# Patient Record
Sex: Male | Born: 1950 | Race: White | Hispanic: No | Marital: Married | Smoking: Never smoker
Health system: Southern US, Community
[De-identification: ages and names within clinical notes are randomized; demographics above are authoritative.]

## PROBLEM LIST (undated history)

## (undated) DIAGNOSIS — K219 Gastro-esophageal reflux disease without esophagitis: Secondary | ICD-10-CM

## (undated) DIAGNOSIS — F32A Depression, unspecified: Secondary | ICD-10-CM

## (undated) DIAGNOSIS — J40 Bronchitis, not specified as acute or chronic: Secondary | ICD-10-CM

## (undated) DIAGNOSIS — M1990 Unspecified osteoarthritis, unspecified site: Secondary | ICD-10-CM

## (undated) DIAGNOSIS — C4490 Unspecified malignant neoplasm of skin, unspecified: Secondary | ICD-10-CM

## (undated) DIAGNOSIS — I10 Essential (primary) hypertension: Secondary | ICD-10-CM

## (undated) HISTORY — PX: SKIN CANCER EXCISION: SHX779

## (undated) HISTORY — PX: JOINT REPLACEMENT: SHX530

## (undated) HISTORY — PX: HERNIA REPAIR: SHX51

## (undated) HISTORY — PX: ROTATOR CUFF REPAIR: SHX139

---

## 2001-11-29 ENCOUNTER — Ambulatory Visit (HOSPITAL_BASED_OUTPATIENT_CLINIC_OR_DEPARTMENT_OTHER): Admission: RE | Admit: 2001-11-29 | Discharge: 2001-11-29 | Payer: Self-pay | Admitting: Urology

## 2003-03-26 ENCOUNTER — Encounter (INDEPENDENT_AMBULATORY_CARE_PROVIDER_SITE_OTHER): Payer: Self-pay | Admitting: *Deleted

## 2003-03-26 ENCOUNTER — Ambulatory Visit (HOSPITAL_COMMUNITY): Admission: RE | Admit: 2003-03-26 | Discharge: 2003-03-26 | Payer: Self-pay | Admitting: Gastroenterology

## 2007-03-22 ENCOUNTER — Ambulatory Visit: Payer: Self-pay | Admitting: Internal Medicine

## 2007-07-02 ENCOUNTER — Ambulatory Visit: Payer: Self-pay | Admitting: Internal Medicine

## 2008-10-20 ENCOUNTER — Ambulatory Visit: Payer: Self-pay | Admitting: Internal Medicine

## 2008-10-20 DIAGNOSIS — I10 Essential (primary) hypertension: Secondary | ICD-10-CM | POA: Insufficient documentation

## 2008-10-20 DIAGNOSIS — K219 Gastro-esophageal reflux disease without esophagitis: Secondary | ICD-10-CM | POA: Insufficient documentation

## 2011-02-15 NOTE — Assessment & Plan Note (Signed)
[facility]                             PULMONARY OFFICE NOTE   NAME:[name], [name]                       MRN:          [id number]  DATE:07/02/2007                            DOB:          08/02/1951    HISTORY:  A 60 year old who returns all smiles having totally  eliminated a chronic cough with the use of Nexium daily.  He  acknowledged that he had had mild reflux in the past but did not connect  the reflux and the cough since sometimes he would cough without overt  heartburn.   He is also concerned about hypertension today, having been switched over  to Benicar HCT with orthostatic hypotension even when he takes half a  pill and therefore stopped the medicine three days before coming to the  office for follow-up today.   PHYSICAL EXAMINATION:  He is a pleasant ambulatory white male in no  acute distress.  He had normal vital signs, except for a blood pressure of 150/108.  HEENT:  Unremarkable.  Pharynx is clear.  LUNGS:  Lung fields are clear bilaterally to auscultation and  percussion.  No bruits.  HEART:  Regular rhythm without murmur, rub, or gallop.  ABDOMEN:  Soft, benign.  EXTREMITIES:  Warm without calf tenderness, cyanosis, clubbing.   IMPRESSION:  1. 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  The patient is now      convinced that acid reflux does play a role, but is still not      willing to continue the medicine long term nor should he probably      need to.  However, if we find that every time that he needs      Prilosec on any more than a p.r.n. basis to control cough or overt      heartburn, I think it would make sense to have this patient at age      42 referred to a gastroenterologist to look for other secondary      problems of chronic occult reflux such as esophageal stricture or      Barrett's esophagitis.  2. For now it is fine with me if he uses p.r.n. Prilosec in the place      of the more expensive Nexium  prescription as long as he remembers      the treatment of cyclical cough that I outlined for him in detail.  3. His hypertension is not controlled off of all therapy.  He is      concerned about the cost of his medicines, so I gave him Micardis      40 to take one half daily.  If this is not satisfactory, one option      might be to use low dose clonidine, which will help with both      stress-induced hypertension and also pain-induced (he attributes      much of his poorly controlled hypertension to chronic shoulder      pain).  However, this can certainly be done through Dr. Aurelio Brash      office.  His pulmonary follow-up could be  p.r.n.     Charlaine Dalton. Sherene Sires, MD, [facility]  Electronically Signed    MBW/MedQ  DD: 07/02/2007  DT: 07/03/2007  Job #: [id number]   cc:   Vikki Ports, M.D.

## 2011-02-18 NOTE — Op Note (Signed)
   NAME:  [name] [name], [name] [name]                          ACCOUNT NO.:  [id number]   MEDICAL RECORD NO.:  [id number]                   PATIENT TYPE:  AMB   LOCATION:  ENDO                                 FACILITY:  [facility]   PHYSICIAN:  Graylin Shiver, M.D.                DATE OF BIRTH:  December 16, 1950   DATE OF PROCEDURE:  03/26/2003  DATE OF DISCHARGE:                                 OPERATIVE REPORT   PROCEDURE:  Colonoscopy with polypectomy.   INDICATION FOR PROCEDURE:  Rectal bleeding.   Informed consent was obtained after explanation of the risks of bleeding,  infection, and perforation.   PREMEDICATION:  Fentanyl 70 mcg IV, Versed 7 mg IV.   PROCEDURE:  With the patient in the left lateral decubitus position, a  rectal exam was performed and no masses were felt.  The Olympus colonoscope  was inserted into the rectum and advanced around the colon to the cecum.  Cecal landmarks were identified.  The cecum looked normal.  In the mid-  ascending colon there was an 8 mm sessile polyp.  This was snared and  removed by snare cautery technique.  The polyp skirted off after it was  snared and I never saw the full entire polyp again despite searching the  area.  Suctioning of the fluid in the area did reveal what appeared to be a  piece of polypoid tissue in the container jar; however, it did not appear to  be the whole polyp but hopefully enough for histologic inspection.  The  transverse colon looked normal.  The descending colon, sigmoid, and rectum  looked normal.  The scope was brought out.  The anal canal appeared slightly  reddened.  He tolerated the procedure well without complications.   IMPRESSION:  1. Ascending colon polyp.  2. Redness and erythema in the anal canal.   PLAN:  The pathology on the specimen obtained will be checked.  Hopefully,  there is polypoid tissue there to analyze the histologic appearance of this  polyp.  I suspect that the intermittent rectal bleeding that he  has comes  from anal irritation.                                               Graylin Shiver, M.D.    SFG/MEDQ  D:  03/26/2003  T:  03/27/2003  Job:  [id number]   cc:   Caryn Bee L. Little, M.D.  [address]  [place]  [place] [address]  Fax: [phone]

## 2011-02-18 NOTE — Op Note (Signed)
[facility]  Patient:    [name] [name], [name] [name] Visit Number: [id number] MRN: [id number]          Service Type: NES Location: [facility] Attending Physician:  Monica Becton Proc. Date: 11/29/01 Admit Date:  11/29/2001                             Operative Report  PREOPERATIVE DIAGNOSIS:  Penile glandular adhesion.  POSTOPERATIVE DIAGNOSIS:  Penile glandular adhesion.  PROCEDURE PERFORMED:  Lysis of glandular penile adhesion.  SURGEON:  Claudette Laws, M.D.  ANESTHESIA:  LMA.  DESCRIPTION OF PROCEDURE:  The patient was prepped under LMA anesthesia in the supine position.  He was found to have a rather broad based penile adhesion extending from the corona to the shaft of the penis for about 1.5 cm.  Using a needle tip cautery, we lysed the adhesion right adjacent to the corona of the glans penis and also on the shaft of the penis.  This left two elliptical defects which we then closed with interrupted 4-0 chromic catgut sutures. Approximately 1-2 cc of 1% Xylocaine jelly was used for a penile block.  We then applied Xeroform gauze dressing impregnated with bacitracin ointment followed by Coban circular dressing.  The patient was then taken back to the recovery room in satisfactory condition. Attending Physician:  Monica Becton DD:  11/29/01 TD:  11/29/01 Job: [id number]

## 2011-02-18 NOTE — Assessment & Plan Note (Signed)
[facility]                             PULMONARY OFFICE NOTE   NAME:[name] [name]                       MRN:          [id number]  DATE:03/29/2007                            DOB:          1950-12-22    REFERRING PHYSICIAN:  Vikki Ports, M.D.   REASON FOR CONSULTATION:  Cough.   HISTORY:  This is a 60 year old white male, who says he has been  coughing since he was about 60 years old.  The cough comes and goes,  seems some better while on reflux medicines but typically flares when  he stops the reflux medicines.  He denies any typical seasonal variation  of the cough, associated wheezing or dyspnea.  The cough is more daytime  than nighttime, and denies any nocturnal exacerbation of either cough,  wheezing, orthopnea, PND, leg swelling, fevers, chills, sweats, and  unintended weight loss.  The patient denies any, as I think I said  already, dyspnea or chest pain.   The problem is the patient does not necessarily correlate when his acid  reflux is bad unless his cough was bad.  He does think that dry weather  makes it a little bit worse but otherwise has no obvious other  triggering factors or alleviating factors other Other than the fact  refluxes medicines make it some better.   PAST MEDICAL HISTORY:  Recorded in detail from the worksheet and  unremarkable.   ALLERGIES:  PENICILLIN CAUSES HIVES.   MEDICATIONS:  Flomax and Zoloft only.   SOCIAL HISTORY:  He has never smoked.  He works as a Special educational needs teacher.   FAMILY HISTORY:  Significant for the absence of respiratory disease,  atopy.   REVIEW OF SYSTEMS:  Taken in detail on the work sheet.  Negative except  as outlined above.  Occasional nasal congestion which he does not  correlate with a cough.   PHYSICAL EXAMINATION:  GENERAL:  This is a pleasant ambulatory  moderately obese white male in no acute distress.  VITAL SIGNS:  Normal vital signs.  HEENT:  Mild turbinate edema,  nonspecific in nature.  Oropharynx is  clear.  No evidence of postnasal drainage or cobblestoning.  Air canals  clear.  NECK:  Supple without cervical adenopathy or tenderness.  Trachea is  midline, no thyromegaly.  LUNGS:  Lung fields are perfectly clear bilaterally to auscultation and  percussion.  HEART:  There is a regular rhythm without murmur, gallop.  ABDOMEN:  Soft, benign with no palpable organomegaly, mass or  tenderness.  EXTREMITIES:  Warm without calf tenderness, cyanosis, clubbing, or  edema.  NEUROLOGIC:  No focal deficits or pathologic reflexes.  SKIN:  Warm and dry.   LABORATORY DATA:  Hemoglobin saturation 96% on room air.   Chest x-ray was reviewed Feb 21, 2006, and was normal.  PFTs were  reviewed from Feb 18, 2007, and were also normal.   IMPRESSION:  This patient has an almost lifelong cough that comes and  goes and seems better with proton pump inhibitor therapy.  The problem  is that he thinks as long as he  is not having active reflux, that it  could not be contributing to a cough, and this understands the fact that  coughing causes reflux just as much as reflux causes coughing.  In  addition, the amount of acid that it takes to irritate the upper airway  is much less than the amount of acid to cause overt heartburn symptoms.   In addition, he tends to use way too many menthol products just to  suppress the urge to cough which tends to relax the gastroesophageal  valve and cause nonacid reflux, which he may not even recognize as  reflux.   I therefore recommended the following:  1. First, I educated him on diet which is just as important according      to the cough specialists as is proton pump inhibitor in this      condition.  2. I recommended he continued Nexium perfectly regular before      breakfast for the next 6 weeks before returning here for followup.  3. Suppress excess cough with Delsym plus tramadol p.r.n.  4. Six day course of prednisone  was also recommended for the      inflammatory component typically associated with cyclical coughing.  5. If this does not eliminate the cough, the next step would be to get      a methacholine challenge test while still maintaining proton pump      inhibitor therapy.     Charlaine Dalton. Sherene Sires, MD, [facility]  Electronically Signed    MBW/MedQ  DD: 03/29/2007  DT: 03/29/2007  Job #: [id number]   cc:   Vikki Ports, M.D.

## 2011-04-04 ENCOUNTER — Other Ambulatory Visit: Payer: Self-pay | Admitting: Dermatology

## 2012-04-30 ENCOUNTER — Other Ambulatory Visit: Payer: Self-pay | Admitting: Dermatology

## 2012-09-14 ENCOUNTER — Other Ambulatory Visit: Payer: Self-pay | Admitting: Dermatology

## 2012-10-22 ENCOUNTER — Other Ambulatory Visit: Payer: Self-pay | Admitting: Dermatology

## 2013-04-15 ENCOUNTER — Other Ambulatory Visit: Payer: Self-pay | Admitting: Gastroenterology

## 2014-12-20 ENCOUNTER — Emergency Department (HOSPITAL_COMMUNITY): Payer: BLUE CROSS/BLUE SHIELD

## 2014-12-20 ENCOUNTER — Emergency Department (HOSPITAL_COMMUNITY)
Admission: EM | Admit: 2014-12-20 | Discharge: 2014-12-20 | Disposition: A | Discharge status: Home / Self Care | Payer: BLUE CROSS/BLUE SHIELD | Attending: Emergency Medicine | Admitting: Emergency Medicine

## 2014-12-20 ENCOUNTER — Encounter (HOSPITAL_COMMUNITY): Payer: Self-pay | Admitting: Nurse Practitioner

## 2014-12-20 DIAGNOSIS — S300XXA Contusion of lower back and pelvis, initial encounter: Secondary | ICD-10-CM

## 2014-12-20 DIAGNOSIS — Y9389 Activity, other specified: Secondary | ICD-10-CM | POA: Insufficient documentation

## 2014-12-20 DIAGNOSIS — Y998 Other external cause status: Secondary | ICD-10-CM | POA: Diagnosis not present

## 2014-12-20 DIAGNOSIS — J069 Acute upper respiratory infection, unspecified: Secondary | ICD-10-CM | POA: Diagnosis not present

## 2014-12-20 DIAGNOSIS — S3992XA Unspecified injury of lower back, initial encounter: Secondary | ICD-10-CM | POA: Diagnosis present

## 2014-12-20 DIAGNOSIS — Z880 Allergy status to penicillin: Secondary | ICD-10-CM | POA: Diagnosis not present

## 2014-12-20 DIAGNOSIS — Y9289 Other specified places as the place of occurrence of the external cause: Secondary | ICD-10-CM | POA: Diagnosis not present

## 2014-12-20 DIAGNOSIS — W01198A Fall on same level from slipping, tripping and stumbling with subsequent striking against other object, initial encounter: Secondary | ICD-10-CM | POA: Insufficient documentation

## 2014-12-20 DIAGNOSIS — I10 Essential (primary) hypertension: Secondary | ICD-10-CM | POA: Insufficient documentation

## 2014-12-20 DIAGNOSIS — Z79899 Other long term (current) drug therapy: Secondary | ICD-10-CM | POA: Insufficient documentation

## 2014-12-20 DIAGNOSIS — S20229A Contusion of unspecified back wall of thorax, initial encounter: Secondary | ICD-10-CM | POA: Diagnosis not present

## 2014-12-20 HISTORY — DX: Essential (primary) hypertension: I10

## 2014-12-20 HISTORY — DX: Bronchitis, not specified as acute or chronic: J40

## 2014-12-20 LAB — BASIC METABOLIC PANEL
Anion gap: 10 (ref 5–15)
BUN: 14 mg/dL (ref 6–23)
CHLORIDE: 101 mmol/L (ref 96–112)
CO2: 27 mmol/L (ref 19–32)
Calcium: 9.5 mg/dL (ref 8.4–10.5)
Creatinine, Ser: 0.89 mg/dL (ref 0.50–1.35)
GFR calc Af Amer: >90 mL/min (ref >90)
GFR calc non Af Amer: 90 mL/min — ABNORMAL LOW (ref >90)
Glucose, Bld: 107 mg/dL — ABNORMAL HIGH (ref 70–99)
POTASSIUM: 4.3 mmol/L (ref 3.5–5.1)
Sodium: 138 mmol/L (ref 135–145)

## 2014-12-20 LAB — PROTIME-INR
INR: 0.97 (ref 0.00–1.49)
Prothrombin Time: 13 seconds (ref 11.6–15.2)

## 2014-12-20 LAB — CBC
HEMATOCRIT: 43.1 % (ref 39.0–52.0)
Hemoglobin: 15.1 g/dL (ref 13.0–17.0)
MCH: 33 pg (ref 26.0–34.0)
MCHC: 35 g/dL (ref 30.0–36.0)
MCV: 94.1 fL (ref 78.0–100.0)
PLATELETS: 142 10*3/uL — AB (ref 150–400)
RBC: 4.58 MIL/uL (ref 4.22–5.81)
RDW: 13.2 % (ref 11.5–15.5)
WBC: 6.8 10*3/uL (ref 4.0–10.5)

## 2014-12-20 LAB — APTT: APTT: 25 s (ref 24–37)

## 2014-12-20 MED ORDER — NAPROXEN 500 MG PO TABS: 500.0000 mg | ORAL_TABLET | Freq: Two times a day (BID) | ORAL | Status: DC

## 2014-12-20 MED ORDER — NAPROXEN 250 MG PO TABS
500.0000 mg | ORAL_TABLET | Freq: Once | ORAL | Status: AC
  Administered 2014-12-20: 500 mg via ORAL
  Filled 2014-12-20: qty 2

## 2014-12-20 NOTE — ED Notes (Signed)
Pt left without being given discharge instructions.  Pt was seen by MD before leaving.  Unable to obtain signature or provide packet.  RN to call to provide pt with opportunity to pick up paperwork from [facility].

## 2014-12-20 NOTE — Discharge Instructions (Signed)
Contusion °A contusion is a deep bruise. Contusions are the result of an injury that caused bleeding under the skin. The contusion may turn blue, purple, or yellow. Minor injuries will give you a painless contusion, but more severe contusions may stay painful and swollen for a few weeks.  °CAUSES  °A contusion is usually caused by a blow, trauma, or direct force to an area of the body. °SYMPTOMS  °· Swelling and redness of the injured area. °· Bruising of the injured area. °· Tenderness and soreness of the injured area. °· Pain. °DIAGNOSIS  °The diagnosis can be made by taking a history and physical exam. An X-ray, CT scan, or MRI may be needed to determine if there were any associated injuries, such as fractures. °TREATMENT  °Specific treatment will depend on what area of the body was injured. In general, the best treatment for a contusion is resting, icing, elevating, and applying cold compresses to the injured area. Over-the-counter medicines may also be recommended for pain control. Ask your caregiver what the best treatment is for your contusion. °HOME CARE INSTRUCTIONS  °· Put ice on the injured area. °¨ Put ice in a plastic bag. °¨ Place a towel between your skin and the bag. °¨ Leave the ice on for 15-20 minutes, 3-4 times a day, or as directed by your health care provider. °· Only take over-the-counter or prescription medicines for pain, discomfort, or fever as directed by your caregiver. Your caregiver may recommend avoiding anti-inflammatory medicines (aspirin, ibuprofen, and naproxen) for 48 hours because these medicines may increase bruising. °· Rest the injured area. °· If possible, elevate the injured area to reduce swelling. °SEEK IMMEDIATE MEDICAL CARE IF:  °· You have increased bruising or swelling. °· You have pain that is getting worse. °· Your swelling or pain is not relieved with medicines. °MAKE SURE YOU:  °· Understand these instructions. °· Will watch your condition. °· Will get help right  away if you are not doing well or get worse. °Document Released: 06/29/2005 Document Revised: 09/24/2013 Document Reviewed: 07/25/2011 °Exit[employer]® Patient Information ©2015 [employer]. This information is not intended to replace advice given to you by your health care provider. Make sure you discuss any questions you have with your health care provider. ° °

## 2014-12-20 NOTE — ED Provider Notes (Signed)
CSN: [id number]     Arrival date & time 12/20/14  1529 History   First MD Initiated Contact with Patient 12/20/14 1634     Chief Complaint  Patient presents with  . Fall  . Cough     (Consider location/radiation/quality/duration/timing/severity/associated sxs/prior Treatment) Patient is a 65 y.o. male presenting with fall and cough. The history is provided by the patient.  Fall This is a new problem. The current episode started in the past 7 days. The problem occurs constantly. The problem has been gradually worsening. Associated symptoms include coughing. Pertinent negatives include no arthralgias, chest pain, fatigue, fever, headaches, joint swelling, myalgias, neck pain, numbness, rash or weakness. Nothing aggravates the symptoms. He has tried nothing for the symptoms. The treatment provided no relief.  Cough Cough characteristics:  Productive Sputum characteristics:  White Severity:  Mild Onset quality:  Gradual Duration:  7 days Timing:  Constant Progression:  Worsening Chronicity:  New Smoker: no   Relieved by:  None tried Worsened by:  Nothing tried Ineffective treatments:  None tried Associated symptoms: sinus congestion   Associated symptoms: no chest pain, no fever, no headaches, no myalgias, no rash and no shortness of breath   Risk factors: recent travel ([place]     Past Medical History  Diagnosis Date  . Hypertension   . Bronchitis    History reviewed. No pertinent past surgical history. History reviewed. No pertinent family history. History  Substance Use Topics  . Smoking status: Never Smoker   . Smokeless tobacco: Not on file  . Alcohol Use: Yes    Review of Systems  Constitutional: Negative for fever and fatigue.  Respiratory: Positive for cough. Negative for shortness of breath.   Cardiovascular: Negative for chest pain.  Musculoskeletal: Positive for back pain. Negative for myalgias, joint swelling, arthralgias, gait problem and neck  pain.  Skin: Negative for rash.  Neurological: Negative for weakness, numbness and headaches.  All other systems reviewed and are negative.     Allergies  Penicillins  Home Medications   Prior to Admission medications   Medication Sig Start Date End Date Taking? Authorizing Provider  Multiple Vitamin (MULTI-VITAMIN PO) Take 1 tablet by mouth daily.   Yes Historical Provider, MD  Omega-3 Fatty Acids (FISH OIL PO) Take 1 capsule by mouth daily.   Yes Historical Provider, MD  sertraline (ZOLOFT) 100 MG tablet Take 50 mg by mouth daily. 10/27/14  Yes Historical Provider, MD  tamsulosin (FLOMAX) 0.4 MG CAPS capsule Take 0.4 mg by mouth daily. 12/04/14  Yes Historical Provider, MD  telmisartan (MICARDIS) 40 MG tablet Take 40 mg by mouth daily. 10/27/14  Yes Historical Provider, MD  naproxen (NAPROSYN) 500 MG tablet Take 1 tablet (500 mg total) by mouth 2 (two) times daily with a meal. 12/20/14   Larence Penning, MD   BP 173/95 mmHg  Pulse 92  Temp(Src) 97.9 F (36.6 C) (Oral)  Resp 18  SpO2 97% Physical Exam  Constitutional: He is oriented to person, place, and time. He appears well-developed and well-nourished. No distress.  Alert, pleasant, cooperative  HENT:  Head: Normocephalic and atraumatic.  Mouth/Throat: Oropharynx is clear and moist. No oropharyngeal exudate.  Nasal congestion. Mild posterior oropharyngeal erythema.  Eyes: Conjunctivae and EOM are normal. Pupils are equal, round, and reactive to light.  Neck: Normal range of motion. Neck supple.  Cardiovascular: Normal rate, regular rhythm, normal heart sounds and intact distal pulses.  Exam reveals no gallop and no friction rub.   No murmur  heard. Pulmonary/Chest: Effort normal and breath sounds normal. No respiratory distress. He has no wheezes. He has no rales.  Abdominal: Soft. He exhibits no distension. There is no tenderness.  Musculoskeletal: Normal range of motion. He exhibits tenderness. He exhibits no edema.  Bruising,  hematoma to lumbar and sacral area. Bruising extends down the right buttocks. No overlying step-offs or deformities noted.  Lymphadenopathy:    He has no cervical adenopathy.  Neurological: He is alert and oriented to person, place, and time. He displays normal reflexes. No cranial nerve deficit.  5/5 strength in bilateral lower extremities. No sensory deficits. Ambulatory without ataxia or pain.  Skin: Skin is warm and dry. No rash noted. He is not diaphoretic.  Psychiatric: He has a normal mood and affect. His behavior is normal. Judgment and thought content normal.  Nursing note and vitals reviewed.   ED Course  Procedures (including critical care time) Labs Review Labs Reviewed  CBC - Abnormal; Notable for the following:    Platelets 142 (*)    All other components within normal limits  BASIC METABOLIC PANEL - Abnormal; Notable for the following:    Glucose, Bld 107 (*)    GFR calc non Af Amer 90 (*)    All other components within normal limits  PROTIME-INR  APTT    Imaging Review Dg Chest 2 View (if Patient Has Fever And/or Copd)  12/20/2014   CLINICAL DATA:  Recent history of bronchitis and cough  EXAM: CHEST  2 VIEW  COMPARISON:  11/25/2014  FINDINGS: Cardiac shadow is within normal limits. The lungs are well aerated bilaterally. No focal infiltrate or sizable effusion is seen. Degenerative changes of the thoracic spine are noted.  IMPRESSION: No acute abnormality noted.   Electronically Signed   By: Inez Catalina M.D.   On: 12/20/2014 16:21   Dg Lumbar Spine 2-3 Views  12/20/2014   CLINICAL DATA:  Fall with lumbar injury 5 days ago. Continued pain and swelling. Initial encounter.  EXAM: LUMBAR SPINE - 2-3 VIEW  COMPARISON:  None.  FINDINGS: Five non rib-bearing lumbar type vertebra are identified in normal alignment.  There is no evidence of acute fracture or subluxation.  Very mild multilevel degenerative disc disease noted.  No focal bony lesions are identified.  IMPRESSION: No  acute bony abnormality.  Very mild multilevel degenerative disc disease.   Electronically Signed   By: Margarette Canada M.D.   On: 12/20/2014 18:20   Dg Sacrum/coccyx  12/20/2014   CLINICAL DATA:  He slipped on wet floor landing on his lower back on on air condition unit 5 days ago. He has large purple bruised area covering his entire lower back with some swelling. He c/o severe pain at the site  EXAM: SACRUM AND COCCYX - 2+ VIEW  COMPARISON:  None.  FINDINGS: There is no evidence of fracture or other focal bone lesions.  IMPRESSION: Negative.   Electronically Signed   By: Lajean Manes M.D.   On: 12/20/2014 18:19     EKG Interpretation None      MDM   Final diagnoses:  Lumbar contusion, initial encounter  Viral URI   64 year old male with no significant past medical history presents with 2 complaints. First is nonproductive cough with upper respiratory congestion and infection. Symptoms present the last 2-3 days and also she was fever or dyspnea or chest pain. Chest x-ray obtained from triage is negative and history and physical exam consistent with upper respiratory infection of viral origin. Afebrile  and he mechanically stable. No indications that this is related to a travel borne illness as he has no other associated symptoms.  Second complaint is lumbar and sacral back pain with bruising and hematoma. Symptoms are present for one week and bruising is worsening since his fall. He had a fall from ground level and hit his back on a marble ledge. No weakness numbness or tingling in lower extremities, no loss of bowel or bladder, and no signs or symptoms concerning for cauda equina. He is ambulatory without difficulty and has a nonfocal neurologic exam. Likely this is a localized hematoma and bruising, however we will evaluate for lumbar or sacral fracture with plain films. He has not tried anything for pain and we will start with naproxen at the patient's request. Blood work including coags sent for  evaluation.  Blood work reassuring. Plain films negative. DC to home with anti-inflammatories and RICE therapy. PCP follow up as needed.  Larence Penning, MD 12/20/14 [id number]  Pattricia Boss, MD 12/20/14 6433

## 2014-12-20 NOTE — ED Notes (Addendum)
He slipped on wet floor landing on his lower back on on air condition unit 5 days ago. He has large purple bruised area covering his entire lower back with some swelling. He c/o severe pain at the site. He denies taking any blood thinners.  He recently was treated for bronchitis with inhalers by his PCP, states things were somewhat better but over past week cough has gotten worse and now hes coughing up white sputum. Denies fevers. Denies CP. A&Ox4, breathing easily, ambulatory

## 2015-05-08 ENCOUNTER — Encounter: Payer: Self-pay | Admitting: Cardiovascular Disease

## 2015-05-08 ENCOUNTER — Ambulatory Visit (INDEPENDENT_AMBULATORY_CARE_PROVIDER_SITE_OTHER): Payer: BLUE CROSS/BLUE SHIELD | Admitting: Cardiovascular Disease

## 2015-05-08 VITALS — BP 148/92 | HR 70 | Ht 69.0 in | Wt 226.3 lb

## 2015-05-08 DIAGNOSIS — Z8249 Family history of ischemic heart disease and other diseases of the circulatory system: Secondary | ICD-10-CM

## 2015-05-08 DIAGNOSIS — Z79899 Other long term (current) drug therapy: Secondary | ICD-10-CM

## 2015-05-08 DIAGNOSIS — E785 Hyperlipidemia, unspecified: Secondary | ICD-10-CM

## 2015-05-08 MED ORDER — ATORVASTATIN CALCIUM 40 MG PO TABS: 40.0000 mg | ORAL_TABLET | Freq: Every day | ORAL | Status: DC

## 2015-05-08 NOTE — Progress Notes (Signed)
Cardiology Office Note   Date:  05/08/2015   ID:  [name] [name], DOB 11/17/1950, MRN [id number]  PCP:  Cari Caraway, MD  Cardiologist:   Sharol Harness, MD   Chief Complaint  Patient presents with  . Establish Care    family history of cad,,no chest pain , no sob , no swelling- sister just recently had CABG X3      History of Present Illness: [name] [name] is a 64 y.o. male with HTN who presents for cardiac stress testing due to family history of CAD.  [name] [name] is a physically active gentleman who presents today because his older sister recently had a large heart attack and required coronary artery bypass grafting. He exercises twice per week by lifting weights and doing cardio and golfs twice per week. He never has chest pain or pressure with these activities. He also denies shortness of breath, lower extremity edema, orthopnea, PND and palpitations. He is a part of the wellness program at his job and regularly checks both his blood pressure and lipids. Most recently his total cholesterol was 181 with an LDL of 93 and HDL of 81. He does not smoke, though he is frequently around a lot of secondhand smoke.  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 His job is very stressful and he has also had some recent life stressors. The stress does not prop chest pain or shortness of breath.  He reports drinking up to five bourbon drinks or a bottle of wine on weekend evenings. He does not drink during the week.  [name] [name] has a sharp family history of coronary artery disease. His father had a heart attack at age 59 and died at age 60. His paternal grandfather had a heart attack at age 109. His mother had a heart attack at age 34, though she was a heavy smoker.    Past Medical History  Diagnosis Date  . Hypertension   . Bronchitis     No past surgical history on file.   Current Outpatient Prescriptions  Medication Sig Dispense Refill  . atorvastatin (LIPITOR) 40 MG  tablet Take 1 tablet (40 mg total) by mouth at bedtime. 90 tablet 3  . Multiple Vitamin (MULTI-VITAMIN PO) Take 1 tablet by mouth daily.    . naproxen (NAPROSYN) 500 MG tablet Take 1 tablet (500 mg total) by mouth 2 (two) times daily with a meal. 14 tablet 0  . Omega-3 Fatty Acids (FISH OIL PO) Take 1 capsule by mouth daily.    . sertraline (ZOLOFT) 100 MG tablet Take 50 mg by mouth daily.  0  . tamsulosin (FLOMAX) 0.4 MG CAPS capsule Take 0.4 mg by mouth daily.  0  . telmisartan (MICARDIS) 40 MG tablet Take 40 mg by mouth daily.  0   No current facility-administered medications for this visit.    Allergies:   Penicillins    Social History:  The patient  reports that he has never smoked. He does not have any smokeless tobacco history on file. He reports that he drinks alcohol. He reports that he does not use illicit drugs.   Family History:  The patient's family history includes Emphysema in his mother; Heart attack in his father; Heart disease in his father; Heart disease (age of onset: 79) in his sister.    ROS:  Please see the history of present illness.   Otherwise, review of systems are positive for sinus congestion.   All other systems are reviewed and negative.  PHYSICAL EXAM: VS:  BP 148/92 mmHg  Pulse 70  Ht 5\' 9"  (1.753 m)  Wt 102.649 kg (226 lb 4.8 oz)  BMI 33.40 kg/m2 , BMI Body mass index is 33.4 kg/(m^2). GENERAL:  Well appearing HEENT:  Pupils equal round and reactive, fundi not visualized, oral mucosa unremarkable NECK:  No jugular venous distention, waveform within normal limits, carotid upstroke brisk and symmetric, no bruits, no thyromegaly LYMPHATICS:  No cervical adenopathy LUNGS:  Clear to auscultation bilaterally HEART:  RRR.  PMI not displaced or sustained,S1 and S2 within normal limits, no S3, no S4, no clicks, no rubs, no murmurs ABD:  Flat, positive bowel sounds normal in frequency in pitch, no bruits, no rebound, no guarding, no midline pulsatile mass,  no hepatomegaly, no splenomegaly EXT:  2 plus pulses throughout, no edema, no cyanosis no clubbing SKIN:  No rashes no nodules NEURO:  Cranial nerves II through XII grossly intact, motor grossly intact throughout PSYCH:  Cognitively intact, oriented to person place and time    EKG:  EKG is ordered today. The ekg ordered today demonstrates sinus rhythm at 70 bpm.   Recent Labs: 12/20/2014: BUN 14; Creatinine, Ser 0.89; Hemoglobin 15.1; Platelets 142*; Potassium 4.3; Sodium 138    Lipid Panel No results found for: CHOL, TRIG, HDL, CHOLHDL, VLDL, LDLCALC, LDLDIRECT  03/13/15: Chol 181, ldl 93, hdl 81, tri 36  Na 140, K 4.4, Cl 101, BUN 17, Cr 0.77, Glu 101 AST 25, ALT 34 TSH 0.879, fT4 6.3  Wt Readings from Last 3 Encounters:  05/08/15 102.649 kg (226 lb 4.8 oz)  10/20/08 102.059 kg (225 lb)      Other studies Reviewed: Additional studies/ records that were reviewed today include: wellness program records. Review of the above records demonstrates:  Please see elsewhere in the note.      ASSESSMENT AND PLAN:  # Cardiovascular risk assessment: [name] [name] has a strong family history of coronary artery disease. However he is not currently having any symptoms of chest pain, shortness of breath or other anginal equivalents. He undergoes a moderate amount of physical activity and is able to accomplish these without difficulty.  There is no clear indicaiton for stress testing, as he already exercises without symptoms.  Although I did offer the option of undergoing treadmill exercise test stating, I explained that this is unlikely to change his management and he is OK with continuing preventative measures for now.  His ASCVD 10 year risk of CV events is 10.1%.  We discussed adding a statin, though his lipids are well controlled.  He was amenable to this addition. - Continue ASA 81mg  daily - Start atorvastatin 40mg  daily.  3 month f/u for lipid and lft monitoring. - continue telmesartan  fo BP control - Encouraged weight loss and increased physical activity - Limit EtOH intake to no more than 2 drinks in one sitting, no more than 14 in one week - Low threshold for stress testing if symptoms develop   Current medicines are reviewed at length with the patient today.  The patient does not have concerns regarding medicines.  The following changes have been made:  Start atorvastatin 40mg   Labs/ tests ordered today include: none   Orders Placed This Encounter  Procedures  . Hepatic function panel  . EKG 12-Lead     Disposition:   FU with Dr. Jonelle Sidle C. Oval Linsey in 1 year    Signed, Sharol Harness, MD  05/08/2015 5:08 PM    [facility]

## 2015-05-08 NOTE — Patient Instructions (Signed)
Start Atorvastatin 40 mg - one tablet at bedtime--- SENT TO PRIMEMAIL MAIL ORDER  In 3 months please have lab ( liver panel) drawn-- will mail you lab slip and letter in 2 and 1/2 months.  Your physician wants you to follow-up in 12 months Dr Oval Linsey.  You will receive a reminder letter in the mail two months in advance. If you don't receive a letter, please call our office to schedule the follow-up appointment.\

## 2015-06-09 ENCOUNTER — Ambulatory Visit: Payer: BLUE CROSS/BLUE SHIELD | Admitting: Interventional Cardiology

## 2015-06-22 ENCOUNTER — Telehealth: Payer: Self-pay | Admitting: Cardiovascular Disease

## 2015-06-22 NOTE — Telephone Encounter (Signed)
Received records from [facility] for appointment on 07/17/15 with Dr Oval Linsey.  Records given to [facility] (medical records) for Dr Blenda Mounts schedule on 07/17/15. lp

## 2015-07-15 ENCOUNTER — Encounter: Payer: Self-pay | Admitting: Cardiovascular Disease

## 2015-07-17 ENCOUNTER — Ambulatory Visit: Payer: BLUE CROSS/BLUE SHIELD | Admitting: Cardiovascular Disease

## 2015-07-23 ENCOUNTER — Telehealth: Payer: Self-pay | Admitting: *Deleted

## 2015-07-23 DIAGNOSIS — Z79899 Other long term (current) drug therapy: Secondary | ICD-10-CM

## 2015-07-23 DIAGNOSIS — Z8249 Family history of ischemic heart disease and other diseases of the circulatory system: Secondary | ICD-10-CM

## 2015-07-23 DIAGNOSIS — E785 Hyperlipidemia, unspecified: Secondary | ICD-10-CM

## 2015-07-23 NOTE — Telephone Encounter (Signed)
Mail letter and lap slip hepatic panel

## 2015-07-23 NOTE — Telephone Encounter (Signed)
-----   Message from Raiford Simmonds, RN sent at 05/08/2015  4:20 PM EDT ----- MAIL LAB SLIP AND LETTER HEPATIC PANEL OCT 2016  DUE Aug 08 2015

## 2015-08-07 ENCOUNTER — Telehealth: Payer: Self-pay | Admitting: Cardiovascular Disease

## 2015-08-07 NOTE — Telephone Encounter (Signed)
LMTCB

## 2015-08-07 NOTE — Telephone Encounter (Signed)
Mr. Ress states that he has decided not to take (generic Lipitor) and does not need to have the lipid panel done , because he never took the medicine. Please call if you have any questions .

## 2015-08-10 NOTE — Telephone Encounter (Signed)
Returned call to patient, LMOM.

## 2016-05-25 ENCOUNTER — Other Ambulatory Visit: Payer: Self-pay | Admitting: Surgery

## 2019-12-27 ENCOUNTER — Ambulatory Visit: Payer: Medicare Other | Attending: Internal Medicine

## 2019-12-27 DIAGNOSIS — Z23 Encounter for immunization: Secondary | ICD-10-CM

## 2019-12-27 NOTE — Progress Notes (Signed)
   Covid-19 Vaccination Clinic  Name:  [name] [name]    MRN: [id number] DOB: [birth year]  12/27/2019  [name] was observed post Covid-19 immunization for 15 minutes without incident. He was provided with Vaccine Information Sheet and instruction to access the V-Safe system.   [name] was instructed to call 911 with any severe reactions post vaccine: Marland Kitchen Difficulty breathing  . Swelling of face and throat  . A fast heartbeat  . A bad rash all over body  . Dizziness and weakness   Immunizations Administered    Name Date Dose VIS Date Route   Moderna COVID-19 Vaccine 12/27/2019 12:19 PM 0.5 mL 09/03/2019 Intramuscular   Manufacturer: Moderna   Lot: [id number]   [id number]

## 2020-01-29 ENCOUNTER — Ambulatory Visit: Payer: Medicare Other | Attending: Internal Medicine

## 2020-01-29 DIAGNOSIS — Z23 Encounter for immunization: Secondary | ICD-10-CM

## 2020-01-29 NOTE — Progress Notes (Signed)
   Covid-19 Vaccination Clinic  Name:  MATEI COOMER    MRN: [id number] DOB: 1950-11-10  01/29/2020  Mr. Reuter was observed post Covid-19 immunization for 15 minutes without incident. He was provided with Vaccine Information Sheet and instruction to access the V-Safe system.   Mr. Hussain was instructed to call 911 with any severe reactions post vaccine: Marland Kitchen Difficulty breathing  . Swelling of face and throat  . A fast heartbeat  . A bad rash all over body  . Dizziness and weakness   Immunizations Administered    Name Date Dose VIS Date Route   Moderna COVID-19 Vaccine 01/29/2020 12:29 PM 0.5 mL 09/2019 Intramuscular   Manufacturer: Moderna   Lot: [id number]   [id number]

## 2020-05-08 ENCOUNTER — Other Ambulatory Visit: Payer: Self-pay | Admitting: Family Medicine

## 2020-05-08 DIAGNOSIS — Z8719 Personal history of other diseases of the digestive system: Secondary | ICD-10-CM

## 2020-05-08 DIAGNOSIS — Z8249 Family history of ischemic heart disease and other diseases of the circulatory system: Secondary | ICD-10-CM

## 2020-05-08 DIAGNOSIS — R7989 Other specified abnormal findings of blood chemistry: Secondary | ICD-10-CM

## 2020-11-03 ENCOUNTER — Other Ambulatory Visit: Payer: Self-pay | Admitting: Family Medicine

## 2020-11-03 DIAGNOSIS — Z8719 Personal history of other diseases of the digestive system: Secondary | ICD-10-CM | POA: Diagnosis not present

## 2020-11-03 DIAGNOSIS — Z23 Encounter for immunization: Secondary | ICD-10-CM | POA: Diagnosis not present

## 2020-11-03 DIAGNOSIS — R7989 Other specified abnormal findings of blood chemistry: Secondary | ICD-10-CM

## 2020-11-03 DIAGNOSIS — I10 Essential (primary) hypertension: Secondary | ICD-10-CM | POA: Diagnosis not present

## 2020-11-03 DIAGNOSIS — R945 Abnormal results of liver function studies: Secondary | ICD-10-CM | POA: Diagnosis not present

## 2020-11-18 ENCOUNTER — Ambulatory Visit
Admission: RE | Admit: 2020-11-18 | Discharge: 2020-11-18 | Disposition: A | Discharge status: Home / Self Care | Payer: Medicare Other | Source: Ambulatory Visit | Attending: Family Medicine | Admitting: Family Medicine

## 2020-11-18 DIAGNOSIS — R7989 Other specified abnormal findings of blood chemistry: Secondary | ICD-10-CM | POA: Diagnosis not present

## 2020-11-18 DIAGNOSIS — Z8719 Personal history of other diseases of the digestive system: Secondary | ICD-10-CM

## 2020-12-04 ENCOUNTER — Other Ambulatory Visit: Payer: Self-pay | Admitting: Family Medicine

## 2020-12-04 DIAGNOSIS — K828 Other specified diseases of gallbladder: Secondary | ICD-10-CM

## 2020-12-29 ENCOUNTER — Other Ambulatory Visit: Payer: Self-pay

## 2020-12-29 ENCOUNTER — Ambulatory Visit
Admission: RE | Admit: 2020-12-29 | Discharge: 2020-12-29 | Disposition: A | Discharge status: Home / Self Care | Payer: Medicare Other | Source: Ambulatory Visit | Attending: Family Medicine | Admitting: Family Medicine

## 2020-12-29 DIAGNOSIS — K828 Other specified diseases of gallbladder: Secondary | ICD-10-CM

## 2020-12-29 MED ORDER — GADOBENATE DIMEGLUMINE 529 MG/ML IV SOLN
20.0000 mL | Freq: Once | INTRAVENOUS | Status: AC | PRN
  Administered 2020-12-29: 20 mL via INTRAVENOUS

## 2021-01-05 ENCOUNTER — Ambulatory Visit: Payer: Self-pay | Admitting: Surgery

## 2021-01-05 DIAGNOSIS — K824 Cholesterolosis of gallbladder: Secondary | ICD-10-CM | POA: Diagnosis not present

## 2021-01-05 NOTE — H&P (Signed)
[name] [name] Appointment: 01/05/2021 11:00 AM Location: [facility] Patient #: [id number] DOB: 29-Jun-1951 Married / Language: [name] [name] / Race: White Male  History of Present Illness [name] A. Kelicia Youtz MD; 01/05/2021 12:14 PM) Patient words: Patient sent for evaluation of gallbladder mass detected on ultrasound and magnetic resonance imaging. He was noted on a physical this year to have elevated liver function studies. He does have a history of excess alcohol intake. He underwent ultrasound which showed a 1 similar gallbladder mass versus sludge and magnetic resonance imaging which verified these findings. He denies abdominal pain, nausea, vomiting or itching or yellowing of the eyes.      Elevated liver function tests. Abnormal gallbladder on recent ultrasound with possible mass.  EXAM: MRI ABDOMEN WITHOUT AND WITH CONTRAST  TECHNIQUE: Multiplanar multisequence MR imaging of the abdomen was performed both before and after the administration of intravenous contrast.  CONTRAST: 30mL MULTIHANCE GADOBENATE DIMEGLUMINE 529 MG/ML IV SOLN  COMPARISON: Ultrasound on 11/18/2020  FINDINGS: Lower chest: No acute findings.  Hepatobiliary: No hepatic masses identified. Several tiny less than 1 cm cysts are seen in the right hepatic lobe. An enhancing polypoid soft tissue density is seen within the gallbladder lumen measuring 10 mm, which corresponds with the abnormality seen on recent ultrasound. This shows no evidence of transmural invasion. No evidence of cholecystitis or biliary ductal dilatation.  Pancreas: No mass or inflammatory changes.  Spleen: Within normal limits in size and appearance.  Adrenals/Urinary Tract: No masses identified. No evidence of hydronephrosis.  Stomach/Bowel: Visualized portion unremarkable.  Vascular/Lymphatic: No pathologically enlarged lymph nodes identified. No abdominal aortic aneurysm.  Other: None.  Musculoskeletal: No  suspicious bone lesions identified.  IMPRESSION: 10 mm enhancing polypoid soft tissue density within the gallbladder lumen. This could represent a benign adenoma although a small gallbladder carcinoma cannot be excluded. Surgical consultation is recommended. This recommendation follows ACR consensus guidelines: White Paper of the ACR Incidental Findings Committee II on Gallbladder and Biliary Findings. J Am Coll Radiol 2013:;10:953-956.  Several tiny sub-cm hepatic cysts. No evidence of hepatic mass or biliary ductal dilatation.   Electronically Signed By: Marlaine Hind M.D. On: 12/29/2020 12:18.  The patient is a 70 year old male.   Allergies Jess Barters, CMA; 01/05/2021 11:07 AM) Penicillin G Benzathine & Proc *PENICILLINS* Allergies Reconciled  Medication History Jess Barters, CMA; 01/05/2021 11:07 AM) Sertraline HCl (100MG  Tablet, Oral) Active. Naproxen (500MG  Tablet, 1 (one) Oral two times daily, Taken starting 05/25/2016) Active. Telmisartan (80MG  Tablet, Oral) Active. Tamsulosin HCl (0.4MG  Capsule, Oral) Active. Medications Reconciled    Vitals Jess Barters CMA; 01/05/2021 11:08 AM) 01/05/2021 11:08 AM Weight: 232.38 lb Height: 69in Body Surface Area: 2.2 m Body Mass Index: 34.32 kg/m  Temp.: 97.60F  Pulse: 85 (Regular)  P.OX: 97% (Room air) BP: 160/60(Sitting, Left Arm, Standard)        Physical Exam (Bobbie Valletta A. Tuyet Bader MD; 01/05/2021 12:14 PM)  General Mental Status-Alert. General Appearance-Consistent with stated age. Hydration-Well hydrated. Voice-Normal.  Head and Neck Head-normocephalic, atraumatic with no lesions or palpable masses.  Eye Eyeball - Bilateral-Extraocular movements intact. Sclera/Conjunctiva - Bilateral-No scleral icterus.  Chest and Lung Exam Chest and lung exam reveals -quiet, even and easy respiratory effort with no use of accessory muscles and on auscultation, normal breath sounds, no adventitious  sounds and normal vocal resonance. Inspection Chest Wall - Normal. Back - normal.  Cardiovascular Cardiovascular examination reveals -on palpation PMI is normal in location and amplitude, no palpable S3 or S4. Normal cardiac  borders., normal heart sounds, regular rate and rhythm with no murmurs, carotid auscultation reveals no bruits and normal pedal pulses bilaterally.  Abdomen Inspection Inspection of the abdomen reveals - No Hernias. Skin - Scar - no surgical scars. Palpation/Percussion Palpation and Percussion of the abdomen reveal - Soft, Non Tender, No Rebound tenderness, No Rigidity (guarding) and No hepatosplenomegaly. Auscultation Auscultation of the abdomen reveals - Bowel sounds normal.  Neurologic Neurologic evaluation reveals -alert and oriented x 3 with no impairment of recent or remote memory. Mental Status-Normal.  Musculoskeletal Normal Exam - Left-Upper Extremity Strength Normal and Lower Extremity Strength Normal. Normal Exam - Right-Upper Extremity Strength Normal, Lower Extremity Weakness.    Assessment & Plan (Inice Sanluis A. 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 Potential for malignancy in this circumstance can be anywhere from 10-30% or so. Cholecystectomy recommended for definitive diagnosis and possible treatment. Discussed treatment if this does come back as a gallbladder cancer as well as aunts. This point time is difficult to tell so recommend cholecystectomy as initial first step The procedure has been discussed with the patient. Risks of laparoscopic cholecystectomy include bleeding, infection, bile duct injury, leak, death, open surgery, diarrhea, other surgery, organ injury, blood vessel injury, DVT, and additional care.  Current Plans You are being scheduled for surgery- Our schedulers will call you.  You should hear from our office's  scheduling department within 5 working days about the location, date, and time of surgery. We try to make accommodations for patient's preferences in scheduling surgery, but sometimes the OR schedule or the surgeon's schedule prevents [name] from making those accommodations.  If you have not heard from our office [phone]) in 5 working days, call the office and ask for your surgeon's nurse.  If you have other questions about your diagnosis, plan, or surgery, call the office and ask for your surgeon's nurse.  Pt Education - Pamphlet Given - Laparoscopic Gallbladder Surgery: discussed with patient and provided information. The anatomy & physiology of hepatobiliary & pancreatic function was discussed. The pathophysiology of gallbladder dysfunction was discussed. Natural history risks without surgery was discussed. I feel the risks of no intervention will lead to serious problems that outweigh the operative risks; therefore, I recommended cholecystectomy to remove the pathology. I explained laparoscopic techniques with possible need for an open approach. Probable cholangiogram to evaluate the bilary tract was explained as well.  Risks such as bleeding, infection, abscess, leak, injury to other organs, need for further treatment, heart attack, death, and other risks were discussed. I noted a good likelihood this will help address the problem. Possibility that this will not correct all abdominal symptoms was explained. Goals of post-operative recovery were discussed as well. We will work to minimize complications. An educational handout further explaining the pathology and treatment options was given as well. Questions were answered. The patient expresses understanding & wishes to proceed with surgery.  Pt Education - CCS Laparosopic Post Op HCI ([place])

## 2021-02-03 NOTE — Progress Notes (Addendum)
Surgical Instructions    Your procedure is scheduled on May 11  Report to [facility] Main Entrance "A" at [email or web address].M., then check in with the Admitting office.  Call this number if you have problems the morning of surgery:  [phone]   If you have any questions prior to your surgery date call [phone]: Open Monday-Friday 8am-4pm    Remember:  Do not eat after midnight the night before your surgery  You may drink clear liquids until 0530 am the morning of your surgery.   Clear liquids allowed are: Water, Non-Citrus Juices (without pulp), Carbonated Beverages, Clear Tea, Black Coffee Only, and Gatorade    Take these medicines the morning of surgery with A SIP OF WATER  sertraline (ZOLOFT) tamsulosin (FLOMAX)  As of today, STOP taking any Aspirin (unless otherwise instructed by your surgeon) Aleve, Naproxen, Ibuprofen, Motrin, Advil, Goody's, BC's, all herbal medications, fish oil, and all vitamins.                     Do not wear jewelry            Do not wear lotions, powders, colognes, or deodorant.            Men may shave face and neck.            Do not bring valuables to the hospital.            [facility] is not responsible for any belongings or valuables.  Do NOT Smoke (Tobacco/Vaping) or drink Alcohol 24 hours prior to your procedure If you use a CPAP at night, you may bring all equipment for your overnight stay.   Contacts, glasses, dentures or bridgework may not be worn into surgery, please bring cases for these belongings   For patients admitted to the hospital, discharge time will be determined by your treatment team.   Patients discharged the day of surgery will not be allowed to drive home, and someone needs to stay with them for 24 hours.    Special instructions:   [place]- Preparing For Surgery  Before surgery, you can play an important role. Because skin is not sterile, your skin needs to be as free of germs as possible. You can reduce the number  of germs on your skin by washing with CHG (chlorahexidine gluconate) Soap before surgery.  CHG is an antiseptic cleaner which kills germs and bonds with the skin to continue killing germs even after washing.    Oral Hygiene is also important to reduce your risk of infection.  Remember - BRUSH YOUR TEETH THE MORNING OF SURGERY WITH YOUR REGULAR TOOTHPASTE  Please do not use if you have an allergy to CHG or antibacterial soaps. If your skin becomes reddened/irritated stop using the CHG.  Do not shave (including legs and underarms) for at least 48 hours prior to first CHG shower. It is OK to shave your face.  Please follow these instructions carefully.   1. Shower the NIGHT BEFORE SURGERY and the MORNING OF SURGERY  2. If you chose to wash your hair, wash your hair first as usual with your normal shampoo.  3. After you shampoo, rinse your hair and body thoroughly to remove the shampoo.  4. Wash Face and genitals (private parts) with your normal soap.   5.  Shower the NIGHT BEFORE SURGERY and the MORNING OF SURGERY with CHG Soap.   6. Use CHG Soap as you would any other liquid soap. You  can apply CHG directly to the skin and wash gently with a scrungie or a clean washcloth.   7. Apply the CHG Soap to your body ONLY FROM THE NECK DOWN.  Do not use on open wounds or open sores. Avoid contact with your eyes, ears, mouth and genitals (private parts). Wash Face and genitals (private parts)  with your normal soap.   8. Wash thoroughly, paying special attention to the area where your surgery will be performed.  9. Thoroughly rinse your body with warm water from the neck down.  10. DO NOT shower/wash with your normal soap after using and rinsing off the CHG Soap.  11. Pat yourself dry with a CLEAN TOWEL.  12. Wear CLEAN PAJAMAS to bed the night before surgery  13. Place CLEAN SHEETS on your bed the night before your surgery  14. DO NOT SLEEP WITH PETS.   Day of Surgery: Take a shower with  CHG soap. Wear Clean/Comfortable clothing the morning of surgery Do not apply any deodorants/lotions.   Remember to brush your teeth WITH YOUR REGULAR TOOTHPASTE.   Please read over the following fact sheets that you were given.

## 2021-02-04 ENCOUNTER — Other Ambulatory Visit: Payer: Self-pay

## 2021-02-04 ENCOUNTER — Encounter (HOSPITAL_COMMUNITY): Payer: Self-pay

## 2021-02-04 ENCOUNTER — Encounter (HOSPITAL_COMMUNITY)
Admission: RE | Admit: 2021-02-04 | Discharge: 2021-02-04 | Disposition: A | Discharge status: Home / Self Care | Payer: Medicare Other | Source: Ambulatory Visit | Attending: Surgery | Admitting: Surgery

## 2021-02-04 DIAGNOSIS — Z01818 Encounter for other preprocedural examination: Secondary | ICD-10-CM | POA: Diagnosis not present

## 2021-02-04 HISTORY — DX: Depression, unspecified: F32.A

## 2021-02-04 HISTORY — DX: Gastro-esophageal reflux disease without esophagitis: K21.9

## 2021-02-04 LAB — CBC WITH DIFFERENTIAL/PLATELET
Abs Immature Granulocytes: 0.02 10*3/uL (ref 0.00–0.07)
Basophils Absolute: 0 10*3/uL (ref 0.0–0.1)
Basophils Relative: 1 %
Eosinophils Absolute: 0.2 10*3/uL (ref 0.0–0.5)
Eosinophils Relative: 4 %
HCT: 48.4 % (ref 39.0–52.0)
Hemoglobin: 16.1 g/dL (ref 13.0–17.0)
Immature Granulocytes: 0 %
Lymphocytes Relative: 18 %
Lymphs Abs: 0.9 10*3/uL (ref 0.7–4.0)
MCH: 31.8 pg (ref 26.0–34.0)
MCHC: 33.3 g/dL (ref 30.0–36.0)
MCV: 95.5 fL (ref 80.0–100.0)
Monocytes Absolute: 0.4 10*3/uL (ref 0.1–1.0)
Monocytes Relative: 7 %
Neutro Abs: 3.5 10*3/uL (ref 1.7–7.7)
Neutrophils Relative %: 70 %
Platelets: UNDETERMINED 10*3/uL (ref 150–400)
RBC: 5.07 MIL/uL (ref 4.22–5.81)
RDW: 13.1 % (ref 11.5–15.5)
WBC: 5.1 10*3/uL (ref 4.0–10.5)
nRBC: 0 % (ref 0.0–0.2)

## 2021-02-04 LAB — COMPREHENSIVE METABOLIC PANEL
ALT: 27 U/L (ref 0–44)
AST: 26 U/L (ref 15–41)
Albumin: 4.4 g/dL (ref 3.5–5.0)
Alkaline Phosphatase: 60 U/L (ref 38–126)
Anion gap: 7 (ref 5–15)
BUN: 16 mg/dL (ref 8–23)
CO2: 29 mmol/L (ref 22–32)
Calcium: 9.3 mg/dL (ref 8.9–10.3)
Chloride: 103 mmol/L (ref 98–111)
Creatinine, Ser: 0.92 mg/dL (ref 0.61–1.24)
GFR, Estimated: >60 mL/min (ref >60)
Glucose, Bld: 105 mg/dL — ABNORMAL HIGH (ref 70–99)
Potassium: 4.4 mmol/L (ref 3.5–5.1)
Sodium: 139 mmol/L (ref 135–145)
Total Bilirubin: 0.7 mg/dL (ref 0.3–1.2)
Total Protein: 6.5 g/dL (ref 6.5–8.1)

## 2021-02-04 NOTE — Progress Notes (Signed)
PCP: Cari Caraway, MD Cardiologist: Denies  EKG: 02/04/21 CXR: na ECHO: denies Stress Test: denies Cardiac Cath: denies  Fasting Blood Sugar- na Checks Blood Sugar_na__ times a day  OSA/CPAP: No  ASA/Blood Thinners: No  Covid test 02/08/21  Anesthesia Review: No  Patient denies shortness of breath, fever, cough, and chest pain at PAT appointment.  Patient verbalized understanding of instructions provided today at the PAT appointment.  Patient asked to review instructions at home and day of surgery.

## 2021-02-08 ENCOUNTER — Other Ambulatory Visit (HOSPITAL_COMMUNITY)
Admission: RE | Admit: 2021-02-08 | Discharge: 2021-02-08 | Disposition: A | Discharge status: Home / Self Care | Payer: Medicare Other | Source: Ambulatory Visit | Attending: Surgery | Admitting: Surgery

## 2021-02-08 DIAGNOSIS — Z20822 Contact with and (suspected) exposure to covid-19: Secondary | ICD-10-CM | POA: Diagnosis not present

## 2021-02-08 DIAGNOSIS — Z01812 Encounter for preprocedural laboratory examination: Secondary | ICD-10-CM | POA: Insufficient documentation

## 2021-02-08 LAB — SARS CORONAVIRUS 2 (TAT 6-24 HRS): SARS Coronavirus 2: NEGATIVE

## 2021-02-10 ENCOUNTER — Other Ambulatory Visit: Payer: Self-pay

## 2021-02-10 ENCOUNTER — Ambulatory Visit (HOSPITAL_COMMUNITY): Payer: Medicare Other

## 2021-02-10 ENCOUNTER — Ambulatory Visit (HOSPITAL_COMMUNITY): Payer: Medicare Other | Admitting: Anesthesiology

## 2021-02-10 ENCOUNTER — Ambulatory Visit (HOSPITAL_COMMUNITY)
Admission: RE | Admit: 2021-02-10 | Discharge: 2021-02-10 | Disposition: A | Discharge status: Home / Self Care | Payer: Medicare Other | Attending: Surgery | Admitting: Surgery

## 2021-02-10 ENCOUNTER — Encounter (HOSPITAL_COMMUNITY): Payer: Self-pay | Admitting: Surgery

## 2021-02-10 ENCOUNTER — Encounter (HOSPITAL_COMMUNITY)
Admission: RE | Disposition: A | Discharge status: Home / Self Care | Payer: Self-pay | Source: Home / Self Care | Attending: Surgery

## 2021-02-10 DIAGNOSIS — Z791 Long term (current) use of non-steroidal anti-inflammatories (NSAID): Secondary | ICD-10-CM | POA: Insufficient documentation

## 2021-02-10 DIAGNOSIS — Z880 Allergy status to penicillin: Secondary | ICD-10-CM | POA: Diagnosis not present

## 2021-02-10 DIAGNOSIS — D376 Neoplasm of uncertain behavior of liver, gallbladder and bile ducts: Secondary | ICD-10-CM | POA: Diagnosis present

## 2021-02-10 DIAGNOSIS — K811 Chronic cholecystitis: Secondary | ICD-10-CM | POA: Insufficient documentation

## 2021-02-10 DIAGNOSIS — Z79899 Other long term (current) drug therapy: Secondary | ICD-10-CM | POA: Insufficient documentation

## 2021-02-10 DIAGNOSIS — K824 Cholesterolosis of gallbladder: Secondary | ICD-10-CM | POA: Diagnosis not present

## 2021-02-10 DIAGNOSIS — F32A Depression, unspecified: Secondary | ICD-10-CM | POA: Diagnosis not present

## 2021-02-10 DIAGNOSIS — K219 Gastro-esophageal reflux disease without esophagitis: Secondary | ICD-10-CM | POA: Diagnosis not present

## 2021-02-10 DIAGNOSIS — I10 Essential (primary) hypertension: Secondary | ICD-10-CM | POA: Diagnosis not present

## 2021-02-10 DIAGNOSIS — K828 Other specified diseases of gallbladder: Secondary | ICD-10-CM | POA: Insufficient documentation

## 2021-02-10 DIAGNOSIS — Z419 Encounter for procedure for purposes other than remedying health state, unspecified: Secondary | ICD-10-CM

## 2021-02-10 HISTORY — PX: CHOLECYSTECTOMY: SHX55

## 2021-02-10 SURGERY — LAPAROSCOPIC CHOLECYSTECTOMY WITH INTRAOPERATIVE CHOLANGIOGRAM
Anesthesia: General | Site: Abdomen

## 2021-02-10 MED ORDER — PHENYLEPHRINE HCL-NACL 10-0.9 MG/250ML-% IV SOLN
INTRAVENOUS | Status: DC | PRN
  Administered 2021-02-10: 30 ug/min via INTRAVENOUS

## 2021-02-10 MED ORDER — PHENYLEPHRINE HCL (PRESSORS) 10 MG/ML IV SOLN
INTRAVENOUS | Status: DC | PRN
  Administered 2021-02-10: 120 ug via INTRAVENOUS
  Administered 2021-02-10: 80 ug via INTRAVENOUS

## 2021-02-10 MED ORDER — GLYCOPYRROLATE PF 0.2 MG/ML IJ SOSY
PREFILLED_SYRINGE | INTRAMUSCULAR | Status: AC
  Filled 2021-02-10: qty 1

## 2021-02-10 MED ORDER — ROCURONIUM BROMIDE 10 MG/ML (PF) SYRINGE
PREFILLED_SYRINGE | INTRAVENOUS | Status: DC | PRN
  Administered 2021-02-10: 10 mg via INTRAVENOUS
  Administered 2021-02-10: 70 mg via INTRAVENOUS

## 2021-02-10 MED ORDER — OXYCODONE HCL 5 MG PO TABS: 5.0000 mg | ORAL_TABLET | Freq: Four times a day (QID) | ORAL | 0 refills | Status: DC | PRN

## 2021-02-10 MED ORDER — MIDAZOLAM HCL 5 MG/5ML IJ SOLN: INTRAMUSCULAR | Status: DC | PRN

## 2021-02-10 MED ORDER — PHENYLEPHRINE 40 MCG/ML (10ML) SYRINGE FOR IV PUSH (FOR BLOOD PRESSURE SUPPORT)
PREFILLED_SYRINGE | INTRAVENOUS | Status: DC | PRN
  Administered 2021-02-10: 80 ug via INTRAVENOUS

## 2021-02-10 MED ORDER — GLYCOPYRROLATE 0.2 MG/ML IJ SOLN
INTRAMUSCULAR | Status: DC | PRN
  Administered 2021-02-10 (×2): .1 mg via INTRAVENOUS

## 2021-02-10 MED ORDER — ONDANSETRON HCL 4 MG/2ML IJ SOLN: 4.0000 mg | Freq: Once | INTRAMUSCULAR | Status: DC | PRN

## 2021-02-10 MED ORDER — PROPOFOL 10 MG/ML IV BOLUS
INTRAVENOUS | Status: AC
  Filled 2021-02-10: qty 40

## 2021-02-10 MED ORDER — SODIUM CHLORIDE (PF) 0.9 % IJ SOLN
INTRAMUSCULAR | Status: AC
  Filled 2021-02-10: qty 10

## 2021-02-10 MED ORDER — SODIUM CHLORIDE 0.9 % IR SOLN
Status: DC | PRN
  Administered 2021-02-10 (×2): 1000 mL

## 2021-02-10 MED ORDER — ONDANSETRON HCL 4 MG/2ML IJ SOLN
INTRAMUSCULAR | Status: DC | PRN
  Administered 2021-02-10: 4 mg via INTRAVENOUS

## 2021-02-10 MED ORDER — 0.9 % SODIUM CHLORIDE (POUR BTL) OPTIME
TOPICAL | Status: DC | PRN
  Administered 2021-02-10: 1000 mL

## 2021-02-10 MED ORDER — MIDAZOLAM HCL 5 MG/5ML IJ SOLN
INTRAMUSCULAR | Status: DC | PRN
  Administered 2021-02-10: .5 mg via INTRAVENOUS

## 2021-02-10 MED ORDER — ATROPINE SULFATE 0.4 MG/ML IJ SOLN
INTRAMUSCULAR | Status: DC | PRN
  Administered 2021-02-10: .4 mg via INTRAVENOUS

## 2021-02-10 MED ORDER — FENTANYL CITRATE (PF) 250 MCG/5ML IJ SOLN
INTRAMUSCULAR | Status: AC
  Filled 2021-02-10: qty 5

## 2021-02-10 MED ORDER — METHYLENE BLUE 0.5 % INJ SOLN
INTRAVENOUS | Status: AC
  Filled 2021-02-10: qty 10

## 2021-02-10 MED ORDER — FENTANYL CITRATE (PF) 100 MCG/2ML IJ SOLN
INTRAMUSCULAR | Status: AC
  Filled 2021-02-10: qty 2

## 2021-02-10 MED ORDER — PHENYLEPHRINE 40 MCG/ML (10ML) SYRINGE FOR IV PUSH (FOR BLOOD PRESSURE SUPPORT)
PREFILLED_SYRINGE | INTRAVENOUS | Status: AC
  Filled 2021-02-10: qty 10

## 2021-02-10 MED ORDER — LIDOCAINE 2% (20 MG/ML) 5 ML SYRINGE
INTRAMUSCULAR | Status: DC | PRN
  Administered 2021-02-10: 60 mg via INTRAVENOUS

## 2021-02-10 MED ORDER — CHLORHEXIDINE GLUCONATE CLOTH 2 % EX PADS: 6.0000 | MEDICATED_PAD | Freq: Once | CUTANEOUS | Status: DC

## 2021-02-10 MED ORDER — ROCURONIUM BROMIDE 10 MG/ML (PF) SYRINGE
PREFILLED_SYRINGE | INTRAVENOUS | Status: AC
  Filled 2021-02-10: qty 10

## 2021-02-10 MED ORDER — MIDAZOLAM HCL 2 MG/2ML IJ SOLN
INTRAMUSCULAR | Status: AC
  Filled 2021-02-10: qty 2

## 2021-02-10 MED ORDER — SUGAMMADEX SODIUM 200 MG/2ML IV SOLN
INTRAVENOUS | Status: DC | PRN
  Administered 2021-02-10: 200 mg via INTRAVENOUS

## 2021-02-10 MED ORDER — PROPOFOL 10 MG/ML IV BOLUS
INTRAVENOUS | Status: DC | PRN
  Administered 2021-02-10: 170 mg via INTRAVENOUS

## 2021-02-10 MED ORDER — BUPIVACAINE-EPINEPHRINE 0.25% -1:200000 IJ SOLN
INTRAMUSCULAR | Status: DC | PRN
  Administered 2021-02-10: 12 mL

## 2021-02-10 MED ORDER — OXYCODONE HCL 5 MG/5ML PO SOLN: 5.0000 mg | Freq: Once | ORAL | Status: DC | PRN

## 2021-02-10 MED ORDER — LACTATED RINGERS IV SOLN: INTRAVENOUS | Status: DC

## 2021-02-10 MED ORDER — OXYCODONE HCL 5 MG PO TABS
5.0000 mg | ORAL_TABLET | Freq: Once | ORAL | Status: DC | PRN
Start: 2021-02-10 — End: 2021-02-10

## 2021-02-10 MED ORDER — FENTANYL CITRATE (PF) 250 MCG/5ML IJ SOLN
INTRAMUSCULAR | Status: DC | PRN
  Administered 2021-02-10 (×2): 50 ug via INTRAVENOUS
  Administered 2021-02-10: 100 ug via INTRAVENOUS
  Administered 2021-02-10: 50 ug via INTRAVENOUS

## 2021-02-10 MED ORDER — FENTANYL CITRATE (PF) 100 MCG/2ML IJ SOLN
25.0000 ug | INTRAMUSCULAR | Status: DC | PRN
  Administered 2021-02-10: 50 ug via INTRAVENOUS

## 2021-02-10 MED ORDER — BUPIVACAINE-EPINEPHRINE (PF) 0.25% -1:200000 IJ SOLN
INTRAMUSCULAR | Status: AC
  Filled 2021-02-10: qty 30

## 2021-02-10 MED ORDER — HEMOSTATIC AGENTS (NO CHARGE) OPTIME
TOPICAL | Status: DC | PRN
  Administered 2021-02-10: 1 via TOPICAL

## 2021-02-10 MED ORDER — DEXAMETHASONE SODIUM PHOSPHATE 10 MG/ML IJ SOLN
INTRAMUSCULAR | Status: DC | PRN
  Administered 2021-02-10: 10 mg via INTRAVENOUS

## 2021-02-10 MED ORDER — CLINDAMYCIN PHOSPHATE 900 MG/50ML IV SOLN
900.0000 mg | INTRAVENOUS | Status: AC
  Administered 2021-02-10: 900 mg via INTRAVENOUS
  Filled 2021-02-10: qty 50

## 2021-02-10 MED ORDER — CHLORHEXIDINE GLUCONATE 0.12 % MT SOLN
15.0000 mL | Freq: Once | OROMUCOSAL | Status: AC
Start: 2021-02-10 — End: 2021-02-10
  Administered 2021-02-10: 15 mL via OROMUCOSAL
  Filled 2021-02-10: qty 15

## 2021-02-10 MED ORDER — ORAL CARE MOUTH RINSE: 15.0000 mL | Freq: Once | OROMUCOSAL | Status: AC

## 2021-02-10 SURGICAL SUPPLY — 40 items
APPLIER CLIP ROT 10 11.4 M/L (STAPLE) ×2
BLADE CLIPPER SURG (BLADE) ×2 IMPLANT
CANISTER SUCT 3000ML PPV (MISCELLANEOUS) ×2 IMPLANT
CHLORAPREP W/TINT 26 (MISCELLANEOUS) ×2 IMPLANT
CLIP APPLIE ROT 10 11.4 M/L (STAPLE) ×1 IMPLANT
COVER SURGICAL LIGHT HANDLE (MISCELLANEOUS) ×2 IMPLANT
COVER WAND RF STERILE (DRAPES) ×2 IMPLANT
DERMABOND ADVANCED (GAUZE/BANDAGES/DRESSINGS) ×1
DERMABOND ADVANCED .7 DNX12 (GAUZE/BANDAGES/DRESSINGS) ×1 IMPLANT
DRAPE C-ARM 42X120 X-RAY (DRAPES) ×2 IMPLANT
ELECT REM PT RETURN 9FT ADLT (ELECTROSURGICAL) ×2
ELECTRODE REM PT RTRN 9FT ADLT (ELECTROSURGICAL) ×1 IMPLANT
GLOVE BIO SURGEON STRL SZ8 (GLOVE) ×2 IMPLANT
GLOVE SRG 8 PF TXTR STRL LF DI (GLOVE) ×1 IMPLANT
GLOVE SURG UNDER POLY LF SZ8 (GLOVE) ×2
GOWN STRL REUS W/ TWL LRG LVL3 (GOWN DISPOSABLE) ×2 IMPLANT
GOWN STRL REUS W/ TWL XL LVL3 (GOWN DISPOSABLE) ×1 IMPLANT
GOWN STRL REUS W/TWL LRG LVL3 (GOWN DISPOSABLE) ×4
GOWN STRL REUS W/TWL XL LVL3 (GOWN DISPOSABLE) ×2
KIT BASIN OR (CUSTOM PROCEDURE TRAY) ×2 IMPLANT
KIT TURNOVER KIT B (KITS) ×2 IMPLANT
NS IRRIG 1000ML POUR BTL (IV SOLUTION) ×2 IMPLANT
PAD ARMBOARD 7.5X6 YLW CONV (MISCELLANEOUS) ×2 IMPLANT
POUCH RETRIEVAL ECOSAC 10 (ENDOMECHANICALS) ×1 IMPLANT
POUCH RETRIEVAL ECOSAC 10MM (ENDOMECHANICALS) ×2
SCISSORS LAP 5X35 DISP (ENDOMECHANICALS) ×2 IMPLANT
SET CHOLANGIOGRAPH 5 50 .035 (SET/KITS/TRAYS/PACK) ×2 IMPLANT
SET IRRIG TUBING LAPAROSCOPIC (IRRIGATION / IRRIGATOR) ×2 IMPLANT
SET TUBE SMOKE EVAC HIGH FLOW (TUBING) ×2 IMPLANT
SLEEVE ENDOPATH XCEL 5M (ENDOMECHANICALS) ×2 IMPLANT
SPECIMEN JAR SMALL (MISCELLANEOUS) ×2 IMPLANT
SUT MNCRL AB 4-0 PS2 18 (SUTURE) ×2 IMPLANT
TOWEL GREEN STERILE (TOWEL DISPOSABLE) ×2 IMPLANT
TOWEL GREEN STERILE FF (TOWEL DISPOSABLE) ×2 IMPLANT
TRAY LAPAROSCOPIC MC (CUSTOM PROCEDURE TRAY) ×2 IMPLANT
TROCAR XCEL BLUNT TIP 100MML (ENDOMECHANICALS) ×2 IMPLANT
TROCAR XCEL NON-BLD 11X100MML (ENDOMECHANICALS) ×2 IMPLANT
TROCAR XCEL NON-BLD 5MMX100MML (ENDOMECHANICALS) ×2 IMPLANT
WARMER LAPAROSCOPE (MISCELLANEOUS) ×2 IMPLANT
WATER STERILE IRR 1000ML POUR (IV SOLUTION) ×2 IMPLANT

## 2021-02-10 NOTE — Anesthesia Postprocedure Evaluation (Signed)
Anesthesia Post Note  Patient: [name] [name]  Procedure(s) Performed: LAPAROSCOPIC CHOLECYSTECTOMY (N/A Abdomen)     Patient location during evaluation: PACU Anesthesia Type: General Level of consciousness: awake and alert Pain management: pain level controlled Vital Signs Assessment: post-procedure vital signs reviewed and stable Respiratory status: spontaneous breathing, nonlabored ventilation, respiratory function stable and patient connected to nasal cannula oxygen Cardiovascular status: blood pressure returned to baseline and stable Postop Assessment: no apparent nausea or vomiting Anesthetic complications: no   No complications documented.  Last Vitals:  Vitals:   02/10/21 1105 02/10/21 1115  BP: (!) 132/91 134/78  Pulse: 69 70  Resp: 14 17  Temp:  (!) 36.2 C  SpO2: 95% 97%    Last Pain:  Vitals:   02/10/21 1115  TempSrc:   PainSc: 5                  Amire Leazer,Aadin COKER

## 2021-02-10 NOTE — Anesthesia Preprocedure Evaluation (Addendum)

## 2021-02-10 NOTE — Transfer of Care (Signed)
Immediate Anesthesia Transfer of Care Note  Patient: [name] [name]  Procedure(s) Performed: LAPAROSCOPIC CHOLECYSTECTOMY (N/A Abdomen)  Patient Location: PACU  Anesthesia Type:General  Level of Consciousness: awake, alert  and oriented  Airway & Oxygen Therapy: Patient Spontanous Breathing  Post-op Assessment: Report given to RN and Post -op Vital signs reviewed and stable  Post vital signs: Reviewed and stable  Last Vitals:  Vitals Value Taken Time  BP 163/93 02/10/21 1020  Temp 36.5 C 02/10/21 1020  Pulse 87 02/10/21 1020  Resp 19 02/10/21 1020  SpO2 95 % 02/10/21 1020  Vitals shown include unvalidated device data.  Last Pain:  Vitals:   02/10/21 0729  TempSrc:   PainSc: 0-No pain      Patients Stated Pain Goal: 3 (47/42/59 [id number])  Complications: No complications documented.

## 2021-02-10 NOTE — Interval H&P Note (Signed)
History and Physical Interval Note:  02/10/2021 8:19 AM  [name] [name]  has presented today for surgery, with the diagnosis of GALLBLADDER MASS.  The various methods of treatment have been discussed with the patient and family. After consideration of risks, benefits and other options for treatment, the patient has consented to  Procedure(s) with comments: LAPAROSCOPIC CHOLECYSTECTOMY WITH INTRAOPERATIVE CHOLANGIOGRAM (N/A) - POSS IOC as a surgical intervention.  The patient's history has been reviewed, patient examined, no change in status, stable for surgery.  I have reviewed the patient's chart and labs.  Questions were answered to the patient's satisfaction.     Turner Daniels MD

## 2021-02-10 NOTE — Op Note (Signed)
Laparoscopic Cholecystectomy  Procedure Note  Indications: This patient presents with symptomatic gallbladder disease and will undergo laparoscopic cholecystectomy.  Patient's work-up revealed small gallbladder polyps, sludge and he was having symptoms consistent with biliary colic.  We discussed the pros and cons of surgery in the setting after we discussed the pros and cons of surgery the polyps were quite small and the malignant potential was low but he was having some symptoms from this of upper abdominal pain.  After discussion of pros and cons of surgery he wished to proceed with laparoscopic cholecystectomy.  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 The risks, benefits, complications, treatment options, and expected outcomes were discussed with the patient. The possibilities of reaction to medication, pulmonary aspiration, perforation of viscus, bleeding, recurrent infection, finding a normal gallbladder, the need for additional procedures, failure to diagnose a condition, the possible need to convert to an open procedure, and creating a complication requiring transfusion or operation were discussed with the patient. The patient and/or family concurred with the proposed plan, giving informed consent. The site of surgery properly noted/marked. The patient was taken to Operating Room, identified as [name] [name] and the procedure verified as Laparoscopic Cholecystectomy with Intraoperative Cholangiograms. A Time Out was held and the above information confirmed.  Prior to the induction of general anesthesia, antibiotic prophylaxis was administered. General endotracheal anesthesia was then administered and  tolerated well. After the induction, the abdomen was prepped in the usual sterile fashion. The patient was positioned in the supine position with the left arm comfortably tucked, along with some reverse Trendelenburg.  Local anesthetic agent was injected into the skin near the umbilicus and an incision made.  H e had a 5 mm umbilical hernia and this was dissected out and used for the umbilical access point. The midline fascia was incised and the Hasson technique was used to introduce a 12 mm port under direct vision. It was secured with a figure of eight Vicryl suture placed in the usual fashion. Pneumoperitoneum was then created with CO2 and tolerated well without any adverse changes in the patient's vital signs. Additional trocars were introduced under direct vision with an 11 mm trocar in the epigastrium and 2 5 mm trocars in the right upper quadrant. All skin incisions were infiltrated with a local anesthetic agent before making the incision and placing the trocars.   The gallbladder was identified, the fundus grasped and retracted cephalad. Adhesions were lysed bluntly and with the electrocautery where indicated, taking care not to injure any adjacent organs or viscus. The infundibulum was grasped and retracted laterally, exposing the peritoneum overlying the triangle of Calot. This was then divided and exposed in a blunt fashion. The cystic duct was clearly identified and bluntly dissected circumferentially. The junctions of the gallbladder, cystic duct and common bile duct were clearly identified prior to the division of any linear structure.   The critical view was obtained.  The cystic duct was quite small.  Patient had normal liver function studies and no stones on initial work-up.  Liver was grossly normal in appearance.  Given the above findings, cholangiogram was not performed.  The cystic duct was then  ligated with surgical clips  on the patient side and  clipped on the gallbladder side and  divided. The cystic artery was identified, dissected free, ligated with clips and divided as well. Posterior cystic artery clipped and divided.  The cystic artery was divided between clips in similar fashion.  The gallbladder was dissected from the liver bed in retrograde fashion with the electrocautery.  Small branching posterior cystic artery was clipped as needed gallbladder.  No other vascular structures or tubular structures were encountered.  The gallbladder was removed via laparoscopic bag extraction in the umbilical port site.. The liver bed was irrigated and inspected. Hemostasis was achieved with the electrocautery and laparoscopic Surgicel snow was applied.. Copious irrigation was utilized and was repeatedly aspirated until clear all particulate matter. Hemostasis was achieved with no signs of bleeding or bile leakage.  Pneumoperitoneum was completely reduced after viewing removal of the trocars under direct vision. The wound was thoroughly irrigated and the fascia was then closed with a figure of eight suture; the skin was then closed with 4 O monocryl  and a sterile dressing of dernabond was applied.  Instrument, sponge, and needle counts were correct at closure and at the conclusion of the case.   Findings: Mild chronic cholecystitis  Estimated Blood Loss: less than 50 mL         Drains: none         Total IV Fluids: per record         Specimens: Gallbladder           Complications: None; patient tolerated the procedure well.         Disposition: PACU - hemodynamically stable.         Condition: stable

## 2021-02-10 NOTE — Discharge Instructions (Signed)
[facility] ______[facility] °LAPAROSCOPIC SURGERY: POST OP INSTRUCTIONS °Always review your discharge instruction sheet given to you by the facility where your surgery was performed. °IF YOU HAVE DISABILITY OR FAMILY LEAVE FORMS, YOU MUST BRING THEM TO THE OFFICE FOR PROCESSING.   °DO NOT GIVE THEM TO YOUR DOCTOR. ° °1. A prescription for pain medication may be given to you upon discharge.  Take your pain medication as prescribed, if needed.  If narcotic pain medicine is not needed, then you may take acetaminophen (Tylenol) or ibuprofen (Advil) as needed. °2. Take your usually prescribed medications unless otherwise directed. °3. If you need a refill on your pain medication, please contact your pharmacy.  They will contact our office to request authorization. Prescriptions will not be filled after 5pm or on week-ends. °4. You should follow a light diet the first few days after arrival home, such as soup and crackers, etc.  Be sure to include lots of fluids daily. °5. Most patients will experience some swelling and bruising in the area of the incisions.  Ice packs will help.  Swelling and bruising can take several days to resolve.  °6. It is common to experience some constipation if taking pain medication after surgery.  Increasing fluid intake and taking a stool softener (such as Colace) will usually help or prevent this problem from occurring.  A mild laxative (Milk of Magnesia or Miralax) should be taken according to package instructions if there are no bowel movements after 48 hours. °7. Unless discharge instructions indicate otherwise, you may remove your bandages 24-48 hours after surgery, and you may shower at that time.  You may have steri-strips (small skin tapes) in place directly over the incision.  These strips should be left on the skin for 7-10 days.  If your surgeon used skin glue on the incision, you may shower in 24 hours.  The glue will flake off over the next 2-3 weeks.  Any sutures or  staples will be removed at the office during your follow-up visit. °8. ACTIVITIES:  You may resume regular (light) daily activities beginning the next day--such as daily self-care, walking, climbing stairs--gradually increasing activities as tolerated.  You may have sexual intercourse when it is comfortable.  Refrain from any heavy lifting or straining until approved by your doctor. °a. You may drive when you are no longer taking prescription pain medication, you can comfortably wear a seatbelt, and you can safely maneuver your car and apply brakes. °b. RETURN TO WORK:  __________________________________________________________ °9. You should see your doctor in the office for a follow-up appointment approximately 2-3 weeks after your surgery.  Make sure that you call for this appointment within a day or two after you arrive home to insure a convenient appointment time. °10. OTHER INSTRUCTIONS: __________________________________________________________________________________________________________________________ __________________________________________________________________________________________________________________________ °WHEN TO CALL YOUR DOCTOR: °1. Fever over 101.0 °2. Inability to urinate °3. Continued bleeding from incision. °4. Increased pain, redness, or drainage from the incision. °5. Increasing abdominal pain ° °The clinic staff is available to answer your questions during regular business hours.  Please don’t hesitate to call and ask to speak to one of the nurses for clinical concerns.  If you have a medical emergency, go to the nearest emergency room or call 911.  A surgeon from [facility] is always on call at the hospital. °[address], [address], [place], [place]  [address] ? [address] [address], [place], [place]   [address] °[phone] ? [phone] ? FAX [phone] °Web site:   [email or web address] °

## 2021-02-10 NOTE — Anesthesia Procedure Notes (Signed)
Procedure Name: Intubation Date/Time: 02/10/2021 8:39 AM Performed by: Flossie Dibble, CRNA Pre-anesthesia Checklist: Patient identified, Patient being monitored, Timeout performed, Emergency Drugs available and Suction available Patient Re-evaluated:Patient Re-evaluated prior to induction Oxygen Delivery Method: Circle System Utilized Preoxygenation: Pre-oxygenation with 100% oxygen Induction Type: IV induction Ventilation: Mask ventilation without difficulty and Oral airway inserted - appropriate to patient size Laryngoscope Size: Mac, 3 and 4 Grade View: Grade II Tube type: Oral Tube size: 7.0 mm Number of attempts: 1 Airway Equipment and Method: stylet Placement Confirmation: ETT inserted through vocal cords under direct vision,  positive ETCO2 and breath sounds checked- equal and bilateral Secured at: 21 cm Tube secured with: Tape Dental Injury: Teeth and Oropharynx as per pre-operative assessment

## 2021-02-10 NOTE — H&P (Signed)
[name] [name]  Location: [facility] Patient #: [id number] DOB: 06-21-1951 Married / Language: English / Race: White Male  History of Present Illness ( Patient words: Patient sent for evaluation of gallbladder mass detected on ultrasound and magnetic resonance imaging. He was noted on a physical this year to have elevated liver function studies. He does have a history of excess alcohol intake. He underwent ultrasound which showed a 1 similar gallbladder mass versus sludge and magnetic resonance imaging which verified these findings. He denies abdominal pain, nausea, vomiting or itching or yellowing of the eyes.      Elevated liver function tests. Abnormal gallbladder on recent ultrasound with possible mass.  EXAM: MRI ABDOMEN WITHOUT AND WITH CONTRAST  TECHNIQUE: Multiplanar multisequence MR imaging of the abdomen was performed both before and after the administration of intravenous contrast.  CONTRAST: 36mL MULTIHANCE GADOBENATE DIMEGLUMINE 529 MG/ML IV SOLN  COMPARISON: Ultrasound on 11/18/2020  FINDINGS: Lower chest: No acute findings.  Hepatobiliary: No hepatic masses identified. Several tiny less than 1 cm cysts are seen in the right hepatic lobe. An enhancing polypoid soft tissue density is seen within the gallbladder lumen measuring 10 mm, which corresponds with the abnormality seen on recent ultrasound. This shows no evidence of transmural invasion. No evidence of cholecystitis or biliary ductal dilatation.  Pancreas: No mass or inflammatory changes.  Spleen: Within normal limits in size and appearance.  Adrenals/Urinary Tract: No masses identified. No evidence of hydronephrosis.  Stomach/Bowel: Visualized portion unremarkable.  Vascular/Lymphatic: No pathologically enlarged lymph nodes identified. No abdominal aortic aneurysm.  Other: None.  Musculoskeletal: No suspicious bone lesions identified.  IMPRESSION: 10  mm enhancing polypoid soft tissue density within the gallbladder lumen. This could represent a benign adenoma although a small gallbladder carcinoma cannot be excluded. Surgical consultation is recommended. This recommendation follows ACR consensus guidelines: White Paper of the ACR Incidental Findings Committee II on Gallbladder and Biliary Findings. J Am Coll Radiol 2013:;10:953-956.  Several tiny sub-cm hepatic cysts. No evidence of hepatic mass or biliary ductal dilatation.   Electronically Signed By: Marlaine Hind M.D. On: 12/29/2020 12:18.  The patient is a 70 year old male.   Allergies ( Penicillin G Benzathine & Proc *PENICILLINS* Allergies Reconciled  Medication History Sertraline HCl (100MG  Tablet, Oral) Active. Naproxen (500MG  Tablet, 1 (one) Oral two times daily, Taken starting 05/25/2016) Active. Telmisartan (80MG  Tablet, Oral) Active. Tamsulosin HCl (0.4MG  Capsule, Oral) Active. Medications Reconciled    Vitals  Weight: 232.38 lb Height: 69in Body Surface Area: 2.2 m Body Mass Index: 34.32 kg/m  Temp.: 97.72F  Pulse: 85 (Regular)  P.OX: 97% (Room air) BP: 160/60(Sitting, Left Arm, Standard)        Physical Exam   General Mental Status-Alert. General Appearance-Consistent with stated age. Hydration-Well hydrated. Voice-Normal.  Head and Neck Head-normocephalic, atraumatic with no lesions or palpable masses.  Eye Eyeball - Bilateral-Extraocular movements intact. Sclera/Conjunctiva - Bilateral-No scleral icterus.  Chest and Lung Exam Chest and lung exam reveals -quiet, even and easy respiratory effort with no use of accessory muscles and on auscultation, normal breath sounds, no adventitious sounds and normal vocal resonance. Inspection Chest Wall - Normal. Back - normal.  Cardiovascular Cardiovascular examination reveals -on palpation PMI is normal in location and amplitude, no  palpable S3 or S4. Normal cardiac borders., normal heart sounds, regular rate and rhythm with no murmurs, carotid auscultation reveals no bruits and normal pedal pulses bilaterally.  Abdomen Inspection Inspection of the abdomen reveals - No Hernias. Skin -  Scar - no surgical scars. Palpation/Percussion Palpation and Percussion of the abdomen reveal - Soft, Non Tender, No Rebound tenderness, No Rigidity (guarding) and No hepatosplenomegaly. Auscultation Auscultation of the abdomen reveals - Bowel sounds normal.  Neurologic Neurologic evaluation reveals -alert and oriented x 3 with no impairment of recent or remote memory. Mental Status-Normal.  Musculoskeletal Normal Exam - Left-Upper Extremity Strength Normal and Lower Extremity Strength Normal. Normal Exam - Right-Upper Extremity Strength Normal, Lower Extremity Weakness.    Assessment & Plan GALL BLADDER POLYP (K82.4) Impression: Discussed the pros and cons of surgery and the rationale for operating. Potential for malignancy in this circumstance can be anywhere from 10-30% or so. Cholecystectomy recommended for definitive diagnosis and possible treatment. Discussed treatment if this does come back as a gallbladder cancer as well as aunts. This point time is difficult to tell so recommend cholecystectomy as initial first step The procedure has been discussed with the patient. Risks of laparoscopic cholecystectomy include bleeding, infection, bile duct injury, leak, death, open surgery, diarrhea, other surgery, organ injury, blood vessel injury, DVT, and additional care.  Current Plans You are being scheduled for surgery- Our schedulers will call you.  You should hear from our office's scheduling department within 5 working days about the location, date, and time of surgery. We try to make accommodations for patient's preferences in scheduling surgery, but sometimes the OR schedule or the surgeon's schedule prevents [name]  from making those accommodations.  If you have not heard from our office [phone]) in 5 working days, call the office and ask for your surgeon's nurse.  If you have other questions about your diagnosis, plan, or surgery, call the office and ask for your surgeon's nurse.  Pt Education - Pamphlet Given - Laparoscopic Gallbladder Surgery: discussed with patient and provided information. The anatomy & physiology of hepatobiliary & pancreatic function was discussed. The pathophysiology of gallbladder dysfunction was discussed. Natural history risks without surgery was discussed. I feel the risks of no intervention will lead to serious problems that outweigh the operative risks; therefore, I recommended cholecystectomy to remove the pathology. I explained laparoscopic techniques with possible need for an open approach. Probable cholangiogram to evaluate the bilary tract was explained as well.  Risks such as bleeding, infection, abscess, leak, injury to other organs, need for further treatment, heart attack, death, and other risks were discussed. I noted a good likelihood this will help address the problem. Possibility that this will not correct all abdominal symptoms was explained. Goals of post-operative recovery were discussed as well. We will work to minimize complications. An educational handout further explaining the pathology and treatment options was given as well. Questions were answered. The patient expresses understanding & wishes to proceed with surgery.

## 2021-02-11 ENCOUNTER — Encounter (HOSPITAL_COMMUNITY): Payer: Self-pay | Admitting: Surgery

## 2021-02-11 LAB — SURGICAL PATHOLOGY

## 2021-02-18 DIAGNOSIS — R3912 Poor urinary stream: Secondary | ICD-10-CM | POA: Diagnosis not present

## 2021-02-22 DIAGNOSIS — D1801 Hemangioma of skin and subcutaneous tissue: Secondary | ICD-10-CM | POA: Diagnosis not present

## 2021-02-22 DIAGNOSIS — L821 Other seborrheic keratosis: Secondary | ICD-10-CM | POA: Diagnosis not present

## 2021-02-22 DIAGNOSIS — L570 Actinic keratosis: Secondary | ICD-10-CM | POA: Diagnosis not present

## 2021-02-22 DIAGNOSIS — D485 Neoplasm of uncertain behavior of skin: Secondary | ICD-10-CM | POA: Diagnosis not present

## 2021-02-22 DIAGNOSIS — Z85828 Personal history of other malignant neoplasm of skin: Secondary | ICD-10-CM | POA: Diagnosis not present

## 2021-02-22 DIAGNOSIS — D171 Benign lipomatous neoplasm of skin and subcutaneous tissue of trunk: Secondary | ICD-10-CM | POA: Diagnosis not present

## 2021-02-22 DIAGNOSIS — L814 Other melanin hyperpigmentation: Secondary | ICD-10-CM | POA: Diagnosis not present

## 2021-02-22 DIAGNOSIS — D2261 Melanocytic nevi of right upper limb, including shoulder: Secondary | ICD-10-CM | POA: Diagnosis not present

## 2021-02-22 DIAGNOSIS — I788 Other diseases of capillaries: Secondary | ICD-10-CM | POA: Diagnosis not present

## 2021-07-03 ENCOUNTER — Emergency Department (HOSPITAL_BASED_OUTPATIENT_CLINIC_OR_DEPARTMENT_OTHER): Payer: Medicare Other

## 2021-07-03 ENCOUNTER — Emergency Department (HOSPITAL_COMMUNITY): Payer: Medicare Other

## 2021-07-03 ENCOUNTER — Emergency Department (HOSPITAL_COMMUNITY)
Admission: EM | Admit: 2021-07-03 | Discharge: 2021-07-03 | Disposition: A | Discharge status: Home / Self Care | Payer: Medicare Other | Attending: Emergency Medicine | Admitting: Emergency Medicine

## 2021-07-03 ENCOUNTER — Encounter (HOSPITAL_COMMUNITY): Payer: Self-pay

## 2021-07-03 ENCOUNTER — Other Ambulatory Visit: Payer: Self-pay

## 2021-07-03 DIAGNOSIS — Z79899 Other long term (current) drug therapy: Secondary | ICD-10-CM | POA: Diagnosis not present

## 2021-07-03 DIAGNOSIS — M79662 Pain in left lower leg: Secondary | ICD-10-CM | POA: Insufficient documentation

## 2021-07-03 DIAGNOSIS — R600 Localized edema: Secondary | ICD-10-CM | POA: Diagnosis not present

## 2021-07-03 DIAGNOSIS — R609 Edema, unspecified: Secondary | ICD-10-CM | POA: Diagnosis not present

## 2021-07-03 DIAGNOSIS — I10 Essential (primary) hypertension: Secondary | ICD-10-CM | POA: Insufficient documentation

## 2021-07-03 DIAGNOSIS — M7989 Other specified soft tissue disorders: Secondary | ICD-10-CM | POA: Diagnosis not present

## 2021-07-03 DIAGNOSIS — R58 Hemorrhage, not elsewhere classified: Secondary | ICD-10-CM | POA: Insufficient documentation

## 2021-07-03 DIAGNOSIS — I824Z2 Acute embolism and thrombosis of unspecified deep veins of left distal lower extremity: Secondary | ICD-10-CM | POA: Diagnosis not present

## 2021-07-03 LAB — CBC WITH DIFFERENTIAL/PLATELET
Abs Immature Granulocytes: 0.01 10*3/uL (ref 0.00–0.07)
Basophils Absolute: 0 10*3/uL (ref 0.0–0.1)
Basophils Relative: 1 %
Eosinophils Absolute: 0.1 10*3/uL (ref 0.0–0.5)
Eosinophils Relative: 2 %
HCT: 43 % (ref 39.0–52.0)
Hemoglobin: 14.6 g/dL (ref 13.0–17.0)
Immature Granulocytes: 0 %
Lymphocytes Relative: 13 %
Lymphs Abs: 0.9 10*3/uL (ref 0.7–4.0)
MCH: 32.4 pg (ref 26.0–34.0)
MCHC: 34 g/dL (ref 30.0–36.0)
MCV: 95.6 fL (ref 80.0–100.0)
Monocytes Absolute: 0.4 10*3/uL (ref 0.1–1.0)
Monocytes Relative: 6 %
Neutro Abs: 5.4 10*3/uL (ref 1.7–7.7)
Neutrophils Relative %: 78 %
Platelets: 181 10*3/uL (ref 150–400)
RBC: 4.5 MIL/uL (ref 4.22–5.81)
RDW: 13.1 % (ref 11.5–15.5)
WBC: 6.9 10*3/uL (ref 4.0–10.5)
nRBC: 0 % (ref 0.0–0.2)

## 2021-07-03 LAB — BASIC METABOLIC PANEL
Anion gap: 9 (ref 5–15)
BUN: 17 mg/dL (ref 8–23)
CO2: 25 mmol/L (ref 22–32)
Calcium: 9.4 mg/dL (ref 8.9–10.3)
Chloride: 106 mmol/L (ref 98–111)
Creatinine, Ser: 0.74 mg/dL (ref 0.61–1.24)
GFR, Estimated: >60 mL/min (ref >60)
Glucose, Bld: 116 mg/dL — ABNORMAL HIGH (ref 70–99)
Potassium: 4 mmol/L (ref 3.5–5.1)
Sodium: 140 mmol/L (ref 135–145)

## 2021-07-03 NOTE — ED Triage Notes (Signed)
Pt arrived via POV, c/o swelling and discoloration to left foot/ankle that appeared Monday. States he has been having pain to left calf x1 month.

## 2021-07-03 NOTE — ED Notes (Signed)
ED Provider at bedside. Vasc tech.

## 2021-07-03 NOTE — ED Provider Notes (Signed)
[place] DEPT Provider Note   CSN: [id number] Arrival date & time: 07/03/21  1021     History Chief Complaint  Patient presents with   Leg Swelling    [name] [name] is a 70 y.o. male with a past medical history significant for depression, GERD, and hypertension who presents to the ED due to left lower extremity edema and calf pain x2 months.  Patient had a recent laparoscopic cholecystectomy in May 2022.  Patient states calf pain and edema have progressively gotten worse.  No associated chest pain or shortness of breath.  No previous blood clots.  Patient also states his left foot began to become discolored 5 days ago surrounding all his toes. No known injury, but he states he carries heavy metals and could have injured it without any knowledge. No treatment prior to arrival. No aggravating or alleviating factors.  History obtained from patient and past medical records. No interpreter used during encounter.       Past Medical History:  Diagnosis Date   Bronchitis    Depression    GERD (gastroesophageal reflux disease)    Hypertension     Patient Active Problem List   Diagnosis Date Noted   HYPERTENSION 10/20/2008   G E R D 10/20/2008    Past Surgical History:  Procedure Laterality Date   CHOLECYSTECTOMY N/A 02/10/2021   Procedure: LAPAROSCOPIC CHOLECYSTECTOMY;  Surgeon: Erroll Luna, MD;  Location: [facility] OR;  Service: General;  Laterality: N/A;   HERNIA REPAIR     ROTATOR CUFF REPAIR         Family History  Problem Relation Age of Onset   Emphysema Mother        heart failure indusce emphysema   Heart disease Father    Heart attack Father    Heart disease Sister 21       cabgx3    Social History   Tobacco Use   Smoking status: Never   Smokeless tobacco: Never  Vaping Use   Vaping Use: Never used  Substance Use Topics   Alcohol use: Yes    Comment: socially   Drug use: No    Home Medications Prior to Admission  medications   Medication Sig Start Date End Date Taking? Authorizing Provider  APPLE CIDER VINEGAR PO Take 1 capsule by mouth daily.   Yes [provider]  calcium carbonate (TUMS - DOSED IN MG ELEMENTAL CALCIUM) 500 MG chewable tablet Chew 2 tablets by mouth 2 (two) times daily as needed for indigestion or heartburn.   Yes [provider]  Ergocalciferol (VITAMIN D2) 50 MCG (2000 UT) TABS Take 2,000 Units by mouth daily.   Yes [provider]  Multiple Vitamin (MULTI-VITAMIN PO) Take 1 tablet by mouth every other day.   Yes [provider]  naproxen (NAPROSYN) 500 MG tablet Take 500 mg by mouth daily as needed for moderate pain.   Yes [provider]  Omega-3 Fatty Acids (FISH OIL) 1200 MG CPDR Take 1,200 mg by mouth daily.   Yes [provider]  sertraline (ZOLOFT) 100 MG tablet Take 100 mg by mouth daily. 10/27/14  Yes [provider]  tamsulosin (FLOMAX) 0.4 MG CAPS capsule Take 0.4 mg by mouth daily. 12/04/14  Yes [provider]  telmisartan (MICARDIS) 80 MG tablet Take 80 mg by mouth daily. 11/12/20  Yes [provider]  Vitamin E 670 MG (1000 UT) CAPS Take 1,000 Units by mouth daily.   Yes [provider]  Zinc 30 MG TABS Take 30 mg by mouth daily.   Yes [provider]  oxyCODONE (OXY IR/ROXICODONE) 5 MG immediate release tablet Take 1 tablet (5 mg total) by mouth every 6 (six) hours as needed for severe pain. Patient not taking: No sig reported 02/10/21   Erroll Luna, MD    Allergies    Escitalopram oxalate, Irbesartan, and Penicillins  Review of Systems   Review of Systems  Constitutional:  Negative for chills and fever.  Respiratory:  Negative for shortness of breath.   Cardiovascular:  Positive for leg swelling. Negative for chest pain.  Skin:  Positive for color change.  Neurological:  Negative for weakness and numbness.  All other systems reviewed and are negative.  Physical  Exam Updated Vital Signs BP (!) 157/79   Pulse (!) 57   Temp 97.8 F (36.6 C) (Oral)   Resp 16   Ht 5\' 9"  (1.753 m)   Wt 106.6 kg   SpO2 99%   BMI 34.70 kg/m   Physical Exam Vitals and nursing note reviewed.  Constitutional:      General: He is not in acute distress.    Appearance: He is not ill-appearing.  HENT:     Head: Normocephalic.  Eyes:     Pupils: Pupils are equal, round, and reactive to light.  Cardiovascular:     Rate and Rhythm: Normal rate and regular rhythm.     Pulses: Normal pulses.     Heart sounds: Normal heart sounds. No murmur heard.   No friction rub. No gallop.  Pulmonary:     Effort: Pulmonary effort is normal.     Breath sounds: Normal breath sounds.  Abdominal:     General: Abdomen is flat. There is no distension.     Palpations: Abdomen is soft.     Tenderness: There is no abdominal tenderness. There is no guarding or rebound.  Musculoskeletal:        General: Normal range of motion.     Cervical back: Neck supple.     Comments: 2+ pitting edema to LLE. Left calf tenderness. No RLE edema. No right calf tenderness. Pedal pulses palpable bilaterally.   Skin:    General: Skin is warm and dry.     Comments: Ecchymosis to all left toes.  Neurological:     General: No focal deficit present.     Mental Status: He is alert.  Psychiatric:        Mood and Affect: Mood normal.        Behavior: Behavior normal.    ED Results / Procedures / Treatments   Labs (all labs ordered are listed, but only abnormal results are displayed) Labs Reviewed  BASIC METABOLIC PANEL - Abnormal; Notable for the following components:      Result Value   Glucose, Bld 116 (*)    All other components within normal limits  CBC WITH DIFFERENTIAL/PLATELET    EKG None  Radiology DG Tibia/Fibula Left  Result Date: 07/03/2021 CLINICAL DATA:  edema EXAM: LEFT TIBIA AND FIBULA - 2 VIEW; LEFT FOOT - COMPLETE 3+ VIEW COMPARISON:  None. FINDINGS: Left tibia and fibula:  Normal alignment. No acute fracture. Normal mineralization. The soft tissues are unremarkable. Left foot: Normal alignment. No acute fracture. Well-corticated, possible old fracture fragment at the base of the first proximal phalanx. Normal mineralization. The soft tissues are unremarkable. IMPRESSION: Left tibia and fibula, left foot: No malalignment or acute fracture. Electronically Signed   By: Murrell Redden  El-Abd M.D.   On: 07/03/2021 12:34   DG Foot Complete Left  Result Date: 07/03/2021 CLINICAL DATA:  edema EXAM: LEFT TIBIA AND FIBULA - 2 VIEW; LEFT FOOT - COMPLETE 3+ VIEW COMPARISON:  None. FINDINGS: Left tibia and fibula: Normal alignment. No acute fracture. Normal mineralization. The soft tissues are unremarkable. Left foot: Normal alignment. No acute fracture. Well-corticated, possible old fracture fragment at the base of the first proximal phalanx. Normal mineralization. The soft tissues are unremarkable. IMPRESSION: Left tibia and fibula, left foot: No malalignment or acute fracture. Electronically Signed   By: Albin Felling M.D.   On: 07/03/2021 12:34   VAS [name] LOWER EXTREMITY VENOUS (DVT) (7a-7p)  Result Date: 07/03/2021  Lower Venous DVT Study Patient Name:  VINEETH FELL [facility]  Date of Exam:   07/03/2021 Medical Rec #: [id number]         Accession #:    [id number] Date of Birth: [birth year]         Patient Gender: M Patient Age:   47 years Exam Location:  [facility] Procedure:      VAS [name] LOWER EXTREMITY VENOUS (DVT) Referring Phys: Charmaine Downs --------------------------------------------------------------------------------  Indications: Left calf pain and swelling.  Comparison Study: No previous exams Performing Technologist: Jody Hill RVT, RDMS  Examination Guidelines: A complete evaluation includes B-mode imaging, spectral Doppler, color Doppler, and power Doppler as needed of all accessible portions of each vessel. Bilateral testing is considered an integral part of a complete  examination. Limited examinations for reoccurring indications may be performed as noted. The reflux portion of the exam is performed with the patient in reverse Trendelenburg.  +-----+---------------+---------+-----------+----------+--------------+ RIGHTCompressibilityPhasicitySpontaneityPropertiesThrombus Aging +-----+---------------+---------+-----------+----------+--------------+ CFV  Full           Yes      Yes                                 +-----+---------------+---------+-----------+----------+--------------+   +---------+---------------+---------+-----------+----------+--------------+ LEFT     CompressibilityPhasicitySpontaneityPropertiesThrombus Aging +---------+---------------+---------+-----------+----------+--------------+ CFV      Full           Yes      Yes                                 +---------+---------------+---------+-----------+----------+--------------+ SFJ      Full                                                        +---------+---------------+---------+-----------+----------+--------------+ FV Prox  Full           Yes      Yes                                 +---------+---------------+---------+-----------+----------+--------------+ FV Mid   Full           Yes      Yes                                 +---------+---------------+---------+-----------+----------+--------------+ FV DistalFull           Yes      Yes                                 +---------+---------------+---------+-----------+----------+--------------+  PFV      Full                                                        +---------+---------------+---------+-----------+----------+--------------+ POP      Full           Yes      Yes                                 +---------+---------------+---------+-----------+----------+--------------+ PTV      Full                                                         +---------+---------------+---------+-----------+----------+--------------+ PERO     Full                                                        +---------+---------------+---------+-----------+----------+--------------+     Summary: RIGHT: - No evidence of common femoral vein obstruction.  LEFT: - There is no evidence of deep vein thrombosis in the lower extremity. - There is no evidence of superficial venous thrombosis.  - No cystic structure found in the popliteal fossa.  *See table(s) above for measurements and observations.    Preliminary     Procedures Procedures   Medications Ordered in ED Medications - No data to display  ED Course  I have reviewed the triage vital signs and the nursing notes.  Pertinent labs & imaging results that were available during my care of the patient were reviewed by me and considered in my medical decision making (see chart for details).    MDM Rules/Calculators/A&P                          70 year old male presents to the ED due to left lower extremity edema and calf pain x2 months.  Patient had a laparoscopic cholecystectomy in May 2022.  No chest pain or shortness of breath.  No previous blood clots.  Patient also states his toes became discolored 5 days ago.  No known injury.  Upon arrival, vitals all within normal limits.  Patient is afebrile, not tachycardic or hypoxic.  Patient in no acute distress.  Physical exam significant for 2+ pitting edema to left lower extremity.  Left lower extremity neurovascularly intact.  Ecchymosis to all left toes. Given pedal pulses are palpable to LLE, low suspicion for artery occlusion. [name] of LLE to rule out DVT. X-rays to rule out bony fractures given visible ecchymosis.  No chest pain or shortness of breath and normal vitals. Low suspicion for PE.   DVT study negative. X-rays personally reviewed which are negative for any bony fractures. Pedal pulses palpable. Low suspicion for arterial occlusion. Suspect muscular  etiology. Ace wrap placed. Orthopedics number given to patient at discharge. Advised patient to follow-up with PCP and orthopedics for further evaluation. Strict ED precautions discussed with patient. Patient states understanding and  agrees to plan. Patient discharged home in no acute distress and stable vitals  Discussed case with Dr. Melina Copa who evaluated patient at bedside and agrees with assessment and plan.  Final Clinical Impression(s) / ED Diagnoses Final diagnoses:  Leg edema    Rx / DC Orders ED Discharge Orders     None        Karie Kirks 07/03/21 1645    Hayden Rasmussen, MD 07/04/21 [phone]

## 2021-07-03 NOTE — Discharge Instructions (Addendum)
It was a pleasure taking care of you today. As discussed, your ultrasound was negative for any blood clots. Your x-rays did not show any broken bones. Follow-up with PCP for further evaluation. Return to the ER for new or worsening symptoms.   I have included the number of the orthopedic surgeon. Call to schedule an appointment for further evaluation. You could have injured one of your muscles in your calf. You may take over the counter ibuprofen or tylenol as needed for pain. Continue to elevate leg.

## 2021-07-03 NOTE — ED Notes (Signed)
Patient up to the restroom.

## 2021-07-03 NOTE — Progress Notes (Signed)
LLE venous duplex has been completed.  Preliminary results given to Altha Harm, RN.   Results can be found under chart review under CV PROC. 07/03/2021 4:27 PM Cheyann Blecha RVT, RDMS

## 2021-07-07 ENCOUNTER — Ambulatory Visit (INDEPENDENT_AMBULATORY_CARE_PROVIDER_SITE_OTHER): Payer: Medicare Other | Admitting: Podiatry

## 2021-07-07 ENCOUNTER — Ambulatory Visit (INDEPENDENT_AMBULATORY_CARE_PROVIDER_SITE_OTHER): Payer: Medicare Other

## 2021-07-07 ENCOUNTER — Other Ambulatory Visit: Payer: Self-pay

## 2021-07-07 ENCOUNTER — Encounter: Payer: Self-pay | Admitting: Podiatry

## 2021-07-07 DIAGNOSIS — S99912A Unspecified injury of left ankle, initial encounter: Secondary | ICD-10-CM

## 2021-07-07 NOTE — Progress Notes (Signed)
ankle

## 2021-07-07 NOTE — Progress Notes (Signed)
  Subjective:  Patient ID: [name] [name], male    DOB: 30-May-1951,   MRN: [id number]  Chief Complaint  Patient presents with   calf soreness    Left foot calf soreness and edema. Pt state he went to the ER where he was diagnosed with a sprained ankle. Pt was concerned with having a blood clot.     70 y.o. male presents for left calf swelling and pain. Relates about a week ago he developed some swelling and bruising in his left leg. He was seen in the ED and x-rays and ultrasound were negative for fracture or blood clot. Patient here just for second opinion and concern for swelling.  . Denies any other pedal complaints. Denies n/v/f/c.   Past Medical History:  Diagnosis Date   Bronchitis    Depression    GERD (gastroesophageal reflux disease)    Hypertension     Objective:  Physical Exam: Vascular: DP/PT pulses 2/4 bilateral. CFT <3 seconds. Normal hair growth on digits. No edema.  Skin. No lacerations or abrasions bilateral feet. Ecchymosis noted to dorsal digits 2-5. Mild edema noted to calf on left side. Musculoskeletal: MMT 5/5 bilateral lower extremities in DF, PF, Inversion and Eversion. Deceased ROM in DF of ankle joint. No pain with ROM. Tender to anterior calf and posterior calf.  Neurological: Sensation intact to light touch.   Assessment:   1. Ankle injury, left, initial encounter      Plan:  Patient was evaluated and treated and all questions answered. X-rays reviewed and discussed with patient. Discussed tendonitis and contusion and treatment options with patient. Reassured patient that likely not a blood clot and that this will improve with time.  Continue with brace and icing and elevation.  Discussed if any worsening to return.  Patient to follow-up as needed.     Lorenda Peck, DPM

## 2021-07-09 ENCOUNTER — Ambulatory Visit (INDEPENDENT_AMBULATORY_CARE_PROVIDER_SITE_OTHER): Payer: Medicare Other | Admitting: Internal Medicine

## 2021-07-09 ENCOUNTER — Encounter: Payer: Self-pay | Admitting: Internal Medicine

## 2021-07-09 ENCOUNTER — Other Ambulatory Visit: Payer: Self-pay

## 2021-07-09 VITALS — BP 126/80 | HR 74 | Resp 18 | Ht 69.0 in | Wt 233.6 lb

## 2021-07-09 DIAGNOSIS — R350 Frequency of micturition: Secondary | ICD-10-CM

## 2021-07-09 DIAGNOSIS — F3342 Major depressive disorder, recurrent, in full remission: Secondary | ICD-10-CM | POA: Diagnosis not present

## 2021-07-09 DIAGNOSIS — Z0000 Encounter for general adult medical examination without abnormal findings: Secondary | ICD-10-CM

## 2021-07-09 DIAGNOSIS — I10 Essential (primary) hypertension: Secondary | ICD-10-CM | POA: Diagnosis not present

## 2021-07-09 DIAGNOSIS — N400 Enlarged prostate without lower urinary tract symptoms: Secondary | ICD-10-CM | POA: Insufficient documentation

## 2021-07-09 DIAGNOSIS — N401 Enlarged prostate with lower urinary tract symptoms: Secondary | ICD-10-CM

## 2021-07-09 DIAGNOSIS — Z0001 Encounter for general adult medical examination with abnormal findings: Secondary | ICD-10-CM

## 2021-07-09 MED ORDER — SERTRALINE HCL 100 MG PO TABS: 100.0000 mg | ORAL_TABLET | Freq: Every day | ORAL | 3 refills | Status: DC

## 2021-07-09 MED ORDER — TELMISARTAN 80 MG PO TABS: 80.0000 mg | ORAL_TABLET | Freq: Every day | ORAL | 3 refills | Status: DC

## 2021-07-09 NOTE — Progress Notes (Signed)
Subjective:   Patient ID: [name] [name], male    DOB: [birth year], 70 y.o.   MRN: [id number]  HPI Here for new patient visit and medicare wellness and physical, no complaints needs care for chronic medical problems. Please see A/P for status and treatment of chronic medical problems.   Diet: heart healthy  Physical activity: sedentary Depression/mood screen: negative Hearing: intact to whispered voice Visual acuity: grossly normal, overdue for annual eye exam  ADLs: capable Fall risk: none Home safety: good Cognitive evaluation: intact to orientation, naming, recall and repetition EOL planning: adv directives discussed, not in place  [employer] Visit from 07/09/2021 in [place] at [facility] Total Score 0        No flowsheet data found.  I have personally reviewed and have noted 1. The patient's medical and social history - reviewed today no changes 2. Their use of alcohol, tobacco or illicit drugs 3. Their current medications and supplements 4. The patient's functional ability including ADL's, fall risks, home safety risks and hearing or visual impairment. 5. Diet and physical activities 6. Evidence for depression or mood disorders 7. Care team reviewed and updated 8.  The patient is not on an opioid pain medication.  Patient Care Team: Hoyt Koch, MD as PCP - General (Internal Medicine) Wonda Horner, MD as Consulting Physician (Gastroenterology) Michael Boston, MD as Consulting Physician (General Surgery) Skeet Latch, MD as Consulting Physician (Cardiology) Past Medical History:  Diagnosis Date   Bronchitis    Depression    GERD (gastroesophageal reflux disease)    Hypertension    Past Surgical History:  Procedure Laterality Date   CHOLECYSTECTOMY N/A 02/10/2021   Procedure: LAPAROSCOPIC CHOLECYSTECTOMY;  Surgeon: Erroll Luna, MD;  Location: [facility] OR;  Service: General;  Laterality: N/A;   HERNIA REPAIR      ROTATOR CUFF REPAIR     Family History  Problem Relation Age of Onset   Emphysema Mother        heart failure indusce emphysema   Heart disease Father    Heart attack Father    Heart disease Sister 21       cabgx3   Review of Systems  Constitutional: Negative.   HENT: Negative.    Eyes: Negative.   Respiratory:  Negative for cough, chest tightness and shortness of breath.   Cardiovascular:  Negative for chest pain, palpitations and leg swelling.  Gastrointestinal:  Negative for abdominal distention, abdominal pain, constipation, diarrhea, nausea and vomiting.  Musculoskeletal: Negative.   Skin: Negative.   Neurological: Negative.   Psychiatric/Behavioral: Negative.     Objective:  Physical Exam Constitutional:      Appearance: He is well-developed.  HENT:     Head: Normocephalic and atraumatic.  Cardiovascular:     Rate and Rhythm: Normal rate and regular rhythm.  Pulmonary:     Effort: Pulmonary effort is normal. No respiratory distress.     Breath sounds: Normal breath sounds. No wheezing or rales.  Abdominal:     General: Bowel sounds are normal. There is no distension.     Palpations: Abdomen is soft.     Tenderness: There is no abdominal tenderness. There is no rebound.  Musculoskeletal:     Cervical back: Normal range of motion.  Skin:    General: Skin is warm and dry.  Neurological:     Mental Status: He is alert and oriented to person, place, and time.     Coordination: Coordination normal.  Vitals:   07/09/21 1053  BP: 126/80  Pulse: 74  Resp: 18  SpO2: 99%  Weight: 233 lb 9.6 oz (106 kg)  Height: 5\' 9"  (1.753 m)   This visit occurred during the SARS-CoV-2 public health emergency.  Safety protocols were in place, including screening questions prior to the visit, additional usage of staff PPE, and extensive cleaning of exam room while observing appropriate contact time as indicated for disinfecting solutions.   Assessment & Plan:

## 2021-07-09 NOTE — Assessment & Plan Note (Signed)
Taking zoloft 100 mg daily and controlled and wishes to continue. Will rx so he does not run out of medication.

## 2021-07-09 NOTE — Assessment & Plan Note (Signed)
Taking flomax and satisfied with symptom control. Seeing [facility] yearly.

## 2021-07-09 NOTE — Assessment & Plan Note (Signed)
Given strong family history of CAD and well controlled hypertension will order CT calcium score. He is very active physically without symptoms so exercise treadmill test will not likely offer any additional information which is helpful. Checking CMP and lipid panel and adjust telmisartan 80 mg daily as needed. Rx sent in for this today and will continue.

## 2021-07-09 NOTE — Assessment & Plan Note (Signed)
Flu shot counseled yearly. Covid-19 booster counseled. Pneumonia up to date will get records. Shingrix he cannot recall will get records to verify if done. Tetanus will get records for date of last. Colonoscopy done last 2019 and will get records as to date of recall. Counseled about sun safety and mole surveillance. Counseled about the dangers of distracted driving. Given 10 year screening recommendations.

## 2021-07-09 NOTE — Patient Instructions (Addendum)
We have ordered a CT scan for the chest to look at the vessels.  Come back fasting some morning for the blood work. They are on the first floor and are open at 8am.

## 2021-09-08 DIAGNOSIS — H5203 Hypermetropia, bilateral: Secondary | ICD-10-CM | POA: Diagnosis not present

## 2021-09-13 ENCOUNTER — Encounter (HOSPITAL_COMMUNITY): Payer: Self-pay

## 2021-09-13 ENCOUNTER — Ambulatory Visit (HOSPITAL_COMMUNITY)
Admission: EM | Admit: 2021-09-13 | Discharge: 2021-09-13 | Disposition: A | Discharge status: Home / Self Care | Payer: Medicare Other | Attending: Family Medicine | Admitting: Family Medicine

## 2021-09-13 ENCOUNTER — Ambulatory Visit (INDEPENDENT_AMBULATORY_CARE_PROVIDER_SITE_OTHER): Payer: Medicare Other

## 2021-09-13 ENCOUNTER — Other Ambulatory Visit: Payer: Self-pay

## 2021-09-13 DIAGNOSIS — M79661 Pain in right lower leg: Secondary | ICD-10-CM

## 2021-09-13 DIAGNOSIS — M25561 Pain in right knee: Secondary | ICD-10-CM

## 2021-09-13 DIAGNOSIS — M25571 Pain in right ankle and joints of right foot: Secondary | ICD-10-CM

## 2021-09-13 DIAGNOSIS — M7731 Calcaneal spur, right foot: Secondary | ICD-10-CM | POA: Diagnosis not present

## 2021-09-13 NOTE — Discharge Instructions (Addendum)
Your xrays were read as normal.   Continue the naproxen twice daily for a few days more. You could also take some tylenol if needed. See your primary doctor, especially if it continues to bother you

## 2021-09-13 NOTE — ED Triage Notes (Signed)
Pt presents to the office for left foot/ankle pain x 1 day.

## 2021-09-13 NOTE — ED Provider Notes (Signed)
[place]    CSN: [id number] Arrival date & time: 09/13/21  1033      History   Chief Complaint Chief Complaint  Patient presents with   Foot Pain    HPI [name] [name] is a 70 y.o. male.    Foot Pain  Here for history of lower leg pain since yesterday.  Yesterday morning he had been kneeling cleaning the bathroom and had not noticed any trouble until he went and walked into a store and began having pretty intense pain in his lower shin area from his knee down to work his ankle.  No swelling.  And no fall or trauma.  The leg is not red.  He has taken some naproxen and today it is about 40% improved and he is able to walk on it.  Yesterday he had a lot of difficulty walking on it once it started hurting.  He feels pressure around the lower pole of the knee and then pain on his shin    Past Medical History:  Diagnosis Date   Bronchitis    Depression    GERD (gastroesophageal reflux disease)    Hypertension     Patient Active Problem List   Diagnosis Date Noted   Encounter for general adult medical examination with abnormal findings 07/09/2021   BPH (benign prostatic hyperplasia) 07/09/2021   MDD (major depressive disorder), recurrent, in full remission ([place]) 07/09/2021   Essential hypertension 10/20/2008    Past Surgical History:  Procedure Laterality Date   CHOLECYSTECTOMY N/A 02/10/2021   Procedure: LAPAROSCOPIC CHOLECYSTECTOMY;  Surgeon: Erroll Luna, MD;  Location: [facility];  Service: General;  Laterality: N/A;   HERNIA REPAIR     ROTATOR CUFF REPAIR         Home Medications    Prior to Admission medications   Medication Sig Start Date End Date Taking? Authorizing Provider  APPLE CIDER VINEGAR PO Take 1 capsule by mouth daily.    [provider]  calcium carbonate (TUMS - DOSED IN MG ELEMENTAL CALCIUM) 500 MG chewable tablet Chew 2 tablets by mouth 2 (two) times daily as needed for indigestion or heartburn.    [provider]  Ergocalciferol (VITAMIN D2) 50 MCG (2000 UT) TABS Take 2,000 Units by mouth daily.    [provider]  Multiple Vitamin (MULTI-VITAMIN PO) Take 1 tablet by mouth every other day.    [provider]  naproxen (NAPROSYN) 500 MG tablet Take 500 mg by mouth daily as needed for moderate pain.    [provider]  Omega-3 Fatty Acids (FISH OIL) 1200 MG CPDR Take 1,200 mg by mouth daily.    [provider]  sertraline (ZOLOFT) 100 MG tablet Take 1 tablet (100 mg total) by mouth daily. 07/09/21   Hoyt Koch, MD  tamsulosin (FLOMAX) 0.4 MG CAPS capsule Take 0.4 mg by mouth daily. 12/04/14   [provider]  telmisartan (MICARDIS) 80 MG tablet Take 1 tablet (80 mg total) by mouth daily. 07/09/21   Hoyt Koch, MD  Vitamin E 670 MG (1000 UT) CAPS Take 1,000 Units by mouth daily.    [provider]  Zinc 30 MG TABS Take 30 mg by mouth daily.    [provider]    Family History Family History  Problem Relation Age of Onset   Emphysema Mother        heart failure indusce emphysema   Heart disease Father    Heart attack Father  Heart disease Sister 33       cabgx3    Social History Social History   Tobacco Use   Smoking status: Never   Smokeless tobacco: Never  Vaping Use   Vaping Use: Never used  Substance Use Topics   Alcohol use: Yes    Comment: socially   Drug use: No     Allergies   Escitalopram oxalate, Irbesartan, and Penicillins   Review of Systems Review of Systems   Physical Exam Triage Vital Signs ED Triage Vitals  Enc Vitals Group     BP 09/13/21 1215 134/84     Pulse Rate 09/13/21 1215 84     Resp 09/13/21 1215 16     Temp 09/13/21 1215 97.6 F (36.4 C)     Temp Source 09/13/21 1215 Oral     SpO2 09/13/21 1215 100 %     Weight --      Height --      Head Circumference --      Peak Flow --      Pain Score 09/13/21 1216 4     Pain Loc --      Pain Edu? --      Excl. in [place]?  --    No data found.  Updated Vital Signs BP 134/84 (BP Location: Left Arm)   Pulse 84   Temp 97.6 F (36.4 C) (Oral)   Resp 16   SpO2 100%   Visual Acuity Right Eye Distance:   Left Eye Distance:   Bilateral Distance:    Right Eye Near:   Left Eye Near:    Bilateral Near:     Physical Exam Constitutional:      General: He is not in acute distress.    Appearance: He is not toxic-appearing.     Comments: Able to speak in complete sentences, calm, no diaphoresis  Musculoskeletal:        General: No swelling or deformity. Normal range of motion.     Right lower leg: No edema.     Left lower leg: No edema.     Comments: Mildly tender over shin, He does limp when walking.   Skin:    Findings: No rash.  Neurological:     Mental Status: He is alert.     UC Treatments / Results  Labs (all labs ordered are listed, but only abnormal results are displayed) Labs Reviewed - No data to display  EKG   Radiology DG Ankle Complete Right  Result Date: 09/13/2021 CLINICAL DATA:  Right ankle and right knee pain after walking up a ramp a target yesterday. Initial encounter. EXAM: RIGHT ANKLE - COMPLETE 3+ VIEW COMPARISON:  None. FINDINGS: No acute osseous or joint abnormality. Calcaneal spur. Soft tissue calcifications project along the mid fibular shaft. IMPRESSION: No acute findings. Electronically Signed   By: Lorin Picket M.D.   On: 09/13/2021 13:43   DG Knee Complete 4 Views Right  Result Date: 09/13/2021 CLINICAL DATA:  Right shin pain EXAM: RIGHT KNEE - COMPLETE 4+ VIEW COMPARISON:  None. FINDINGS: No acute fracture or dislocation. No aggressive osseous lesion. Normal alignment. Small area of heterotopic calcification along the posterolateral aspect of the right mid lower leg. Soft tissue are unremarkable. No radiopaque foreign body or soft tissue emphysema. IMPRESSION: No acute osseous injury of the right knee. Electronically Signed   By: Kathreen Devoid M.D.   On:  09/13/2021 13:42    Procedures Procedures (including critical care time)  Medications Ordered  in UC Medications - No data to display  Initial Impression / Assessment and Plan / UC Course  I have reviewed the triage vital signs and the nursing notes.  Pertinent labs & imaging results that were available during my care of the patient were reviewed by me and considered in my medical decision making (see chart for details).      Xrays of the knee and lower leg read as normal. Continue naproxen twice daily and he can also take tylenol prn. Final Clinical Impressions(s) / UC Diagnoses   Final diagnoses:  Pain in right lower leg  Acute pain of right knee     Discharge Instructions      Your xrays were read as normal.   Continue the naproxen twice daily for a few days more. You could also take some tylenol if needed. See you primary doctor, especially if it continues to bother you       ED Prescriptions   None    PDMP not reviewed this encounter.   Barrett Henle, MD 09/13/21 [phone]

## 2021-12-07 DIAGNOSIS — M23203 Derangement of unspecified medial meniscus due to old tear or injury, right knee: Secondary | ICD-10-CM | POA: Diagnosis not present

## 2021-12-07 DIAGNOSIS — M25561 Pain in right knee: Secondary | ICD-10-CM | POA: Diagnosis not present

## 2021-12-18 DIAGNOSIS — M25561 Pain in right knee: Secondary | ICD-10-CM | POA: Diagnosis not present

## 2021-12-27 DIAGNOSIS — M79662 Pain in left lower leg: Secondary | ICD-10-CM | POA: Diagnosis not present

## 2021-12-27 DIAGNOSIS — S83241A Other tear of medial meniscus, current injury, right knee, initial encounter: Secondary | ICD-10-CM | POA: Diagnosis not present

## 2021-12-27 DIAGNOSIS — M79604 Pain in right leg: Secondary | ICD-10-CM | POA: Diagnosis not present

## 2021-12-27 DIAGNOSIS — M79661 Pain in right lower leg: Secondary | ICD-10-CM | POA: Diagnosis not present

## 2021-12-27 DIAGNOSIS — M79605 Pain in left leg: Secondary | ICD-10-CM | POA: Diagnosis not present

## 2021-12-28 ENCOUNTER — Other Ambulatory Visit (HOSPITAL_COMMUNITY): Payer: Self-pay | Admitting: Specialist

## 2021-12-28 ENCOUNTER — Ambulatory Visit (HOSPITAL_COMMUNITY)
Admission: RE | Admit: 2021-12-28 | Discharge: 2021-12-28 | Disposition: A | Discharge status: Home / Self Care | Payer: Medicare Other | Source: Ambulatory Visit | Attending: Cardiology | Admitting: Cardiology

## 2021-12-28 ENCOUNTER — Other Ambulatory Visit: Payer: Self-pay

## 2021-12-28 DIAGNOSIS — M79605 Pain in left leg: Secondary | ICD-10-CM

## 2021-12-28 DIAGNOSIS — M79661 Pain in right lower leg: Secondary | ICD-10-CM

## 2021-12-28 DIAGNOSIS — M79604 Pain in right leg: Secondary | ICD-10-CM | POA: Diagnosis not present

## 2021-12-28 DIAGNOSIS — M79662 Pain in left lower leg: Secondary | ICD-10-CM | POA: Diagnosis not present

## 2022-01-17 DIAGNOSIS — S83281A Other tear of lateral meniscus, current injury, right knee, initial encounter: Secondary | ICD-10-CM | POA: Diagnosis not present

## 2022-01-17 DIAGNOSIS — S83241A Other tear of medial meniscus, current injury, right knee, initial encounter: Secondary | ICD-10-CM | POA: Diagnosis not present

## 2022-02-01 ENCOUNTER — Ambulatory Visit (INDEPENDENT_AMBULATORY_CARE_PROVIDER_SITE_OTHER): Payer: Medicare Other | Admitting: Internal Medicine

## 2022-02-01 ENCOUNTER — Encounter: Payer: Self-pay | Admitting: Internal Medicine

## 2022-02-01 DIAGNOSIS — R051 Acute cough: Secondary | ICD-10-CM | POA: Diagnosis not present

## 2022-02-01 DIAGNOSIS — R059 Cough, unspecified: Secondary | ICD-10-CM | POA: Insufficient documentation

## 2022-02-01 MED ORDER — AZITHROMYCIN 250 MG PO TABS: ORAL_TABLET | ORAL | 0 refills | Status: AC

## 2022-02-01 NOTE — Assessment & Plan Note (Signed)
With rhonchi concerning for CAP and going on 11 days without relief. Taking otc cough medicine and will continue. Will rx azithromycin 500 mg day 1 then 250 mg daily days 2-5.  ?

## 2022-02-01 NOTE — Patient Instructions (Addendum)
We have sent in the z pack. Take 2 pills today and then 1 pill a day until gone. ? ? ?

## 2022-02-01 NOTE — Progress Notes (Signed)
? ?  Subjective:  ? ?Patient ID: [name] [name], male    DOB: September 05, 1951, 71 y.o.   MRN: [id number] ? ?HPI ?The patient is a 71 YO man coming in for sick visit.  ? ?Review of Systems  ?Constitutional:  Positive for activity change and appetite change. Negative for chills, fatigue, fever and unexpected weight change.  ?HENT:  Positive for congestion, postnasal drip, rhinorrhea and sinus pressure. Negative for ear discharge, ear pain, sinus pain, sneezing, sore throat, tinnitus, trouble swallowing and voice change.   ?Eyes: Negative.   ?Respiratory:  Positive for cough and shortness of breath. Negative for chest tightness and wheezing.   ?Cardiovascular: Negative.   ?Gastrointestinal: Negative.   ?Musculoskeletal:  Positive for myalgias.  ?Neurological: Negative.   ? ?Objective:  ?Physical Exam ?Constitutional:   ?   Appearance: He is well-developed.  ?HENT:  ?   Head: Normocephalic and atraumatic.  ?   Comments: Oropharynx with redness and clear drainage, [name] with swollen turbinates, TMs normal bilaterally.  ?Neck:  ?   Thyroid: No thyromegaly.  ?Cardiovascular:  ?   Rate and Rhythm: Normal rate and regular rhythm.  ?Pulmonary:  ?   Effort: Pulmonary effort is normal. No respiratory distress.  ?   Breath sounds: Rhonchi present. No wheezing or rales.  ?Abdominal:  ?   Palpations: Abdomen is soft.  ?Musculoskeletal:     ?   General: Tenderness present.  ?   Cervical back: Normal range of motion.  ?Lymphadenopathy:  ?   Cervical: No cervical adenopathy.  ?Skin: ?   General: Skin is warm and dry.  ?Neurological:  ?   Mental Status: He is alert and oriented to person, place, and time.  ? ? ?Vitals:  ? 02/01/22 1333  ?BP: 122/80  ?Pulse: 76  ?Resp: 18  ?Temp: 98.3 ?F (36.8 ?C)  ?TempSrc: Oral  ?SpO2: 96%  ?Weight: 238 lb (108 kg)  ?Height: '5\' 9"'$  (1.753 m)  ? ? ?This visit occurred during the SARS-CoV-2 public health emergency.  Safety protocols were in place, including screening questions prior to the visit, additional  usage of staff PPE, and extensive cleaning of exam room while observing appropriate contact time as indicated for disinfecting solutions.  ? ?Assessment & Plan:  ? ?

## 2022-02-21 DIAGNOSIS — D1801 Hemangioma of skin and subcutaneous tissue: Secondary | ICD-10-CM | POA: Diagnosis not present

## 2022-02-21 DIAGNOSIS — Z85828 Personal history of other malignant neoplasm of skin: Secondary | ICD-10-CM | POA: Diagnosis not present

## 2022-02-21 DIAGNOSIS — L821 Other seborrheic keratosis: Secondary | ICD-10-CM | POA: Diagnosis not present

## 2022-02-21 DIAGNOSIS — D225 Melanocytic nevi of trunk: Secondary | ICD-10-CM | POA: Diagnosis not present

## 2022-02-21 DIAGNOSIS — L570 Actinic keratosis: Secondary | ICD-10-CM | POA: Diagnosis not present

## 2022-02-21 DIAGNOSIS — L814 Other melanin hyperpigmentation: Secondary | ICD-10-CM | POA: Diagnosis not present

## 2022-03-22 ENCOUNTER — Ambulatory Visit (HOSPITAL_COMMUNITY)
Admission: RE | Admit: 2022-03-22 | Discharge: 2022-03-22 | Disposition: A | Discharge status: Home / Self Care | Payer: Medicare Other | Source: Ambulatory Visit | Attending: Internal Medicine | Admitting: Internal Medicine

## 2022-03-22 ENCOUNTER — Other Ambulatory Visit: Payer: Self-pay

## 2022-03-22 ENCOUNTER — Ambulatory Visit (HOSPITAL_COMMUNITY)
Admission: EM | Admit: 2022-03-22 | Discharge: 2022-03-22 | Disposition: A | Discharge status: Home / Self Care | Payer: Medicare Other | Attending: Emergency Medicine | Admitting: Emergency Medicine

## 2022-03-22 ENCOUNTER — Encounter (HOSPITAL_COMMUNITY): Payer: Self-pay | Admitting: *Deleted

## 2022-03-22 DIAGNOSIS — M79672 Pain in left foot: Secondary | ICD-10-CM | POA: Diagnosis not present

## 2022-03-22 DIAGNOSIS — R609 Edema, unspecified: Secondary | ICD-10-CM | POA: Insufficient documentation

## 2022-03-22 DIAGNOSIS — M79605 Pain in left leg: Secondary | ICD-10-CM | POA: Diagnosis not present

## 2022-03-22 DIAGNOSIS — M7989 Other specified soft tissue disorders: Secondary | ICD-10-CM

## 2022-03-22 DIAGNOSIS — S8012XA Contusion of left lower leg, initial encounter: Secondary | ICD-10-CM | POA: Diagnosis not present

## 2022-03-22 DIAGNOSIS — R233 Spontaneous ecchymoses: Secondary | ICD-10-CM | POA: Diagnosis not present

## 2022-03-22 NOTE — ED Notes (Signed)
Pt instructed to go to [facility] on [facility] for vascular US.

## 2022-03-22 NOTE — Progress Notes (Signed)
Left lower extremity venous duplex completed. Refer to "CV Proc" under chart review to view preliminary results.  03/22/2022 11:21 AM Kelby Aline., MHA, RVT, RDCS, RDMS

## 2022-03-22 NOTE — ED Triage Notes (Signed)
Swelling and bruise to Lt inner leg that started Friday 6-16. Pt has surgery on 6-22 on RT knee.

## 2022-03-22 NOTE — ED Provider Notes (Signed)
[place]    CSN: [id number] Arrival date & time: 03/22/22  2505      History   Chief Complaint Chief Complaint  Patient presents with   Leg Pain    HPI [name] [name] is a 71 y.o. male.   Patient presents with left leg bruising and swelling beginning 4 days ago after fall.  Endorses that he tumbled backwards and hit the leg behind the left knee.  Bruising started out behind the knee but overnight traveled up the inner thigh with swelling present.  Area is tender to palpation.  Range of motion of the leg is intact.  Denies numbness or tingling.  Has upcoming meniscus surgery to the right knee in 4 days.  Past Medical History:  Diagnosis Date   Bronchitis    Depression    GERD (gastroesophageal reflux disease)    Hypertension     Patient Active Problem List   Diagnosis Date Noted   Cough 02/01/2022   Encounter for general adult medical examination with abnormal findings 07/09/2021   BPH (benign prostatic hyperplasia) 07/09/2021   MDD (major depressive disorder), recurrent, in full remission ([place]) 07/09/2021   Essential hypertension 10/20/2008    Past Surgical History:  Procedure Laterality Date   CHOLECYSTECTOMY N/A 02/10/2021   Procedure: LAPAROSCOPIC CHOLECYSTECTOMY;  Surgeon: Erroll Luna, MD;  Location: [facility];  Service: General;  Laterality: N/A;   HERNIA REPAIR     ROTATOR CUFF REPAIR         Home Medications    Prior to Admission medications   Medication Sig Start Date End Date Taking? Authorizing Provider  APPLE CIDER VINEGAR PO Take 1 capsule by mouth daily.    [provider]  calcium carbonate (TUMS - DOSED IN MG ELEMENTAL CALCIUM) 500 MG chewable tablet Chew 2 tablets by mouth 2 (two) times daily as needed for indigestion or heartburn.    [provider]  Ergocalciferol (VITAMIN D2) 50 MCG (2000 UT) TABS Take 2,000 Units by mouth daily.    [provider]  Multiple Vitamin (MULTI-VITAMIN PO) Take 1 tablet by  mouth every other day.    [provider]  naproxen (NAPROSYN) 500 MG tablet Take 500 mg by mouth daily as needed for moderate pain.    [provider]  Omega-3 Fatty Acids (FISH OIL) 1200 MG CPDR Take 1,200 mg by mouth daily.    [provider]  sertraline (ZOLOFT) 100 MG tablet Take 1 tablet (100 mg total) by mouth daily. 07/09/21   Hoyt Koch, MD  tamsulosin (FLOMAX) 0.4 MG CAPS capsule Take 0.4 mg by mouth daily. 12/04/14   [provider]  telmisartan (MICARDIS) 80 MG tablet Take 1 tablet (80 mg total) by mouth daily. 07/09/21   Hoyt Koch, MD  Vitamin E 670 MG (1000 UT) CAPS Take 1,000 Units by mouth daily.    [provider]  Zinc 30 MG TABS Take 30 mg by mouth daily.    [provider]    Family History Family History  Problem Relation Age of Onset   Emphysema Mother        heart failure indusce emphysema   Heart disease Father    Heart attack Father    Heart disease Sister 71       cabgx3    Social History Social History   Tobacco Use   Smoking status: Never   Smokeless tobacco: Never  Vaping Use   Vaping Use: Never used  Substance  Use Topics   Alcohol use: Yes    Comment: socially   Drug use: No     Allergies   Escitalopram oxalate, Irbesartan, and Penicillins   Review of Systems Review of Systems  Constitutional: Negative.   Respiratory: Negative.    Cardiovascular: Negative.   Musculoskeletal: Negative.   Skin:  Positive for color change. Negative for pallor, rash and wound.  Neurological: Negative.      Physical Exam Triage Vital Signs ED Triage Vitals  Enc Vitals Group     BP 03/22/22 0941 (!) 151/92     Pulse Rate 03/22/22 0941 69     Resp 03/22/22 0941 16     Temp 03/22/22 0941 97.9 F (36.6 C)     Temp src --      SpO2 03/22/22 0941 99 %     Weight --      Height --      Head Circumference --      Peak Flow --      Pain Score 03/22/22 0939 0     Pain Loc --       Pain Edu? --      Excl. in [place]? --    No data found.  Updated Vital Signs BP (!) 151/92   Pulse 69   Temp 97.9 F (36.6 C)   Resp 16   SpO2 99%   Visual Acuity Right Eye Distance:   Left Eye Distance:   Bilateral Distance:    Right Eye Near:   Left Eye Near:    Bilateral Near:     Physical Exam Constitutional:      Appearance: Normal appearance.  HENT:     Head: Normocephalic.  Eyes:     Extraocular Movements: Extraocular movements intact.  Pulmonary:     Effort: Pulmonary effort is normal.  Skin:    Comments: Moderate amount of ecchymosis noted to the posterior and medial aspect of the left knee extending to the medial of the left thigh, tender to palpation, no hematoma or masses noted, 2+ popliteal pulse, range of motion of the leg is intact, able to bear weight  Neurological:     Mental Status: He is alert and oriented to person, place, and time. Mental status is at baseline.  Psychiatric:        Mood and Affect: Mood normal.        Behavior: Behavior normal.      UC Treatments / Results  Labs (all labs ordered are listed, but only abnormal results are displayed) Labs Reviewed - No data to display  EKG   Radiology No results found.  Procedures Procedures (including critical care time)  Medications Ordered in UC Medications - No data to display  Initial Impression / Assessment and Plan / UC Course  I have reviewed the triage vital signs and the nursing notes.  Pertinent labs & imaging results that were available during my care of the patient were reviewed by me and considered in my medical decision making (see chart for details).  Contusion of left leg, initial encounter Left leg swelling  Amount of bruising is significant based on site of injury, would not expect for bruising to extend upwards and there is mild to moderate swelling noted to the medial left thigh therefore vascular ultrasound to rule out blood clot placed, results pending,  Final  Clinical Impressions(s) / UC Diagnoses   Final diagnoses:  Left leg swelling   Discharge Instructions   None    ED  Prescriptions   None    PDMP not reviewed this encounter.   Hans Eden, NP 03/22/22 1052

## 2022-03-24 DIAGNOSIS — S83232A Complex tear of medial meniscus, current injury, left knee, initial encounter: Secondary | ICD-10-CM | POA: Diagnosis not present

## 2022-03-24 DIAGNOSIS — S83231A Complex tear of medial meniscus, current injury, right knee, initial encounter: Secondary | ICD-10-CM | POA: Diagnosis not present

## 2022-03-24 DIAGNOSIS — G8918 Other acute postprocedural pain: Secondary | ICD-10-CM | POA: Diagnosis not present

## 2022-03-24 DIAGNOSIS — S83282A Other tear of lateral meniscus, current injury, left knee, initial encounter: Secondary | ICD-10-CM | POA: Diagnosis not present

## 2022-03-24 DIAGNOSIS — S83281A Other tear of lateral meniscus, current injury, right knee, initial encounter: Secondary | ICD-10-CM | POA: Diagnosis not present

## 2022-03-24 DIAGNOSIS — M94261 Chondromalacia, right knee: Secondary | ICD-10-CM | POA: Diagnosis not present

## 2022-03-24 DIAGNOSIS — M94262 Chondromalacia, left knee: Secondary | ICD-10-CM | POA: Diagnosis not present

## 2022-04-04 DIAGNOSIS — M25561 Pain in right knee: Secondary | ICD-10-CM | POA: Diagnosis not present

## 2022-04-19 DIAGNOSIS — M25561 Pain in right knee: Secondary | ICD-10-CM | POA: Diagnosis not present

## 2022-05-10 DIAGNOSIS — M25512 Pain in left shoulder: Secondary | ICD-10-CM | POA: Diagnosis not present

## 2022-05-25 ENCOUNTER — Ambulatory Visit (INDEPENDENT_AMBULATORY_CARE_PROVIDER_SITE_OTHER): Payer: Medicare Other

## 2022-05-25 ENCOUNTER — Ambulatory Visit: Payer: Medicare Other | Admitting: Podiatry

## 2022-05-25 DIAGNOSIS — S97102A Crushing injury of unspecified left toe(s), initial encounter: Secondary | ICD-10-CM

## 2022-05-25 NOTE — Progress Notes (Signed)
Subjective:   Patient ID: [name] [name], male   DOB: 71 y.o.   MRN: [id number]   HPI Patient presents with having had a crush injury of his left great toe about a year ago and the nail is growing abnormally on the lateral side and it is difficult to trim   ROS      Objective:  Physical Exam  Neurovascular status intact with thickness of the nailbed left lateral border with history of injury to the area and unsure whether any bone was involved     Assessment:  Damage left hallux nail lateral side with slight redness noted but feeling better after he trimmed it with history of injury     Plan:  H&P x-ray reviewed advised on soaks and that if it were to persist we will have to remove the border educating him on nail removal surgery.  Reappoint as needed  X-rays indicate no signs of fracture associated with this appears to be soft tissue

## 2022-06-02 DIAGNOSIS — R3912 Poor urinary stream: Secondary | ICD-10-CM | POA: Diagnosis not present

## 2022-06-17 DIAGNOSIS — M1711 Unilateral primary osteoarthritis, right knee: Secondary | ICD-10-CM | POA: Diagnosis not present

## 2022-06-23 DIAGNOSIS — M1711 Unilateral primary osteoarthritis, right knee: Secondary | ICD-10-CM | POA: Diagnosis not present

## 2022-07-04 DIAGNOSIS — M1711 Unilateral primary osteoarthritis, right knee: Secondary | ICD-10-CM | POA: Diagnosis not present

## 2022-07-07 ENCOUNTER — Other Ambulatory Visit (INDEPENDENT_AMBULATORY_CARE_PROVIDER_SITE_OTHER): Payer: Medicare Other

## 2022-07-07 DIAGNOSIS — I10 Essential (primary) hypertension: Secondary | ICD-10-CM | POA: Diagnosis not present

## 2022-07-07 LAB — COMPREHENSIVE METABOLIC PANEL
ALT: 22 U/L (ref 0–53)
AST: 17 U/L (ref 0–37)
Albumin: 4.5 g/dL (ref 3.5–5.2)
Alkaline Phosphatase: 50 U/L (ref 39–117)
BUN: 22 mg/dL (ref 6–23)
CO2: 28 mEq/L (ref 19–32)
Calcium: 9.6 mg/dL (ref 8.4–10.5)
Chloride: 99 mEq/L (ref 96–112)
Creatinine, Ser: 1.02 mg/dL (ref 0.40–1.50)
GFR: 74.62 mL/min (ref >60.00)
Glucose, Bld: 105 mg/dL — ABNORMAL HIGH (ref 70–99)
Potassium: 4 mEq/L (ref 3.5–5.1)
Sodium: 135 mEq/L (ref 135–145)
Total Bilirubin: 0.8 mg/dL (ref 0.2–1.2)
Total Protein: 6.6 g/dL (ref 6.0–8.3)

## 2022-07-07 LAB — LIPID PANEL
Cholesterol: 195 mg/dL (ref 0–200)
HDL: 76.8 mg/dL (ref >39.00)
LDL Cholesterol: 109 mg/dL — ABNORMAL HIGH (ref 0–99)
NonHDL: 118.49
Total CHOL/HDL Ratio: 3
Triglycerides: 45 mg/dL (ref 0.0–149.0)
VLDL: 9 mg/dL (ref 0.0–40.0)

## 2022-07-07 LAB — CBC
HCT: 44 % (ref 39.0–52.0)
Hemoglobin: 14.9 g/dL (ref 13.0–17.0)
MCHC: 33.9 g/dL (ref 30.0–36.0)
MCV: 94.9 fl (ref 78.0–100.0)
Platelets: 165 10*3/uL (ref 150.0–400.0)
RBC: 4.63 Mil/uL (ref 4.22–5.81)
RDW: 14.2 % (ref 11.5–15.5)
WBC: 5.6 10*3/uL (ref 4.0–10.5)

## 2022-07-14 ENCOUNTER — Ambulatory Visit (INDEPENDENT_AMBULATORY_CARE_PROVIDER_SITE_OTHER): Payer: Medicare Other | Admitting: Internal Medicine

## 2022-07-14 ENCOUNTER — Encounter: Payer: Self-pay | Admitting: Internal Medicine

## 2022-07-14 VITALS — BP 138/74 | HR 79 | Temp 98.1°F | Ht 69.0 in | Wt 247.0 lb

## 2022-07-14 DIAGNOSIS — I10 Essential (primary) hypertension: Secondary | ICD-10-CM | POA: Diagnosis not present

## 2022-07-14 DIAGNOSIS — R350 Frequency of micturition: Secondary | ICD-10-CM

## 2022-07-14 DIAGNOSIS — F3342 Major depressive disorder, recurrent, in full remission: Secondary | ICD-10-CM | POA: Diagnosis not present

## 2022-07-14 DIAGNOSIS — Z23 Encounter for immunization: Secondary | ICD-10-CM

## 2022-07-14 DIAGNOSIS — Z0001 Encounter for general adult medical examination with abnormal findings: Secondary | ICD-10-CM

## 2022-07-14 DIAGNOSIS — N401 Enlarged prostate with lower urinary tract symptoms: Secondary | ICD-10-CM

## 2022-07-14 NOTE — Patient Instructions (Addendum)
We have done the EKG today and it is normal.

## 2022-07-14 NOTE — Progress Notes (Signed)
Subjective:   Patient ID: [name] [name], male    DOB: 1951-02-08, 71 y.o.   MRN: [id number]  HPI Here for medicare wellness and physical, no new complaints. Please see A/P for status and treatment of chronic medical problems.   Diet: heart healthy Physical activity: active walks but knee pain Depression/mood screen: negative Hearing: intact to whispered voice Visual acuity: grossly normal with lens, performs annual eye exam  ADLs: capable Fall risk: none Home safety: good Cognitive evaluation: intact to orientation, naming, recall and repetition EOL planning: adv directives discussed  [place] Visit from 07/14/2022 in [facility] at [facility] Visit from 07/14/2022 in [place] at [facility]  PHQ-9 Total Score 0         02/10/2021    7:30 AM 07/03/2021   10:41 AM 09/13/2021   12:16 PM 03/22/2022    9:40 AM 07/14/2022    9:26 AM  [place] in the past year?     0  Was there an injury with Fall?     0  Fall Risk Category Calculator     0  Fall Risk Category     Low  Patient Fall Risk Level Moderate fall risk Low fall risk Low fall risk Low fall risk     I have personally reviewed and have noted 1. The patient's medical and social history - reviewed today no changes 2. Their use of alcohol, tobacco or illicit drugs 3. Their current medications and supplements 4. The patient's functional ability including ADL's, fall risks, home safety risks and hearing or visual impairment. 5. Diet and physical activities 6. Evidence for depression or mood disorders 7. Care team reviewed and updated 8.  The patient is not on an opioid pain medication.  Patient Care Team: Hoyt Koch, MD as PCP - General (Internal Medicine) Wonda Horner, MD as Consulting Physician (Gastroenterology) Michael Boston, MD as Consulting Physician (General Surgery) Skeet Latch, MD as Consulting  Physician (Cardiology) Past Medical History:  Diagnosis Date   Bronchitis    Depression    GERD (gastroesophageal reflux disease)    Hypertension    Past Surgical History:  Procedure Laterality Date   CHOLECYSTECTOMY N/A 02/10/2021   Procedure: LAPAROSCOPIC CHOLECYSTECTOMY;  Surgeon: Erroll Luna, MD;  Location: [facility];  Service: General;  Laterality: N/A;   HERNIA REPAIR     ROTATOR CUFF REPAIR     Family History  Problem Relation Age of Onset   Emphysema Mother        heart failure indusce emphysema   Heart disease Father    Heart attack Father    Heart disease Sister 71       cabgx3   Review of Systems  Constitutional: Negative.   HENT: Negative.    Eyes: Negative.   Respiratory:  Negative for cough, chest tightness and shortness of breath.   Cardiovascular:  Negative for chest pain, palpitations and leg swelling.  Gastrointestinal:  Negative for abdominal distention, abdominal pain, constipation, diarrhea, nausea and vomiting.  Musculoskeletal:  Positive for arthralgias.  Skin: Negative.   Neurological: Negative.   Psychiatric/Behavioral: Negative.      Objective:  Physical Exam Constitutional:      Appearance: He is well-developed.  HENT:     Head: Normocephalic and atraumatic.  Cardiovascular:     Rate and Rhythm: Normal rate and regular rhythm.  Pulmonary:  Effort: Pulmonary effort is normal. No respiratory distress.     Breath sounds: Normal breath sounds. No wheezing or rales.  Abdominal:     General: Bowel sounds are normal. There is no distension.     Palpations: Abdomen is soft.     Tenderness: There is no abdominal tenderness. There is no rebound.  Musculoskeletal:        General: Tenderness present.     Cervical back: Normal range of motion.  Skin:    General: Skin is warm and dry.  Neurological:     Mental Status: He is alert and oriented to person, place, and time.     Coordination: Coordination normal.     Vitals:   07/14/22 0922   BP: 138/74  Pulse: 79  Temp: 98.1 F (36.7 C)  SpO2: 95%  Weight: 247 lb (112 kg)  Height: '5\' 9"'$  (1.753 m)   EKG: Rate 75, axis normal, interval normal, sinus, no st or t wave changes, no significant change compared to prior 2022  Assessment & Plan:  Flu shot given at visit

## 2022-07-15 ENCOUNTER — Encounter: Payer: Self-pay | Admitting: Internal Medicine

## 2022-07-15 NOTE — Assessment & Plan Note (Signed)
EKG updated and normal. BP at goal. Continue micardis 80 mg daily. Recent CMP at goal. Okay to proceed with knee replacement when desired without separate pre-op visit.

## 2022-07-15 NOTE — Assessment & Plan Note (Signed)
Flu shot given. Covid-19 counseled. Pneumonia up to date. Shingrix complete. Tetanus due 2030. Colonoscopy up to date he will let us know provider name to get records. Counseled about sun safety and mole surveillance. Counseled about the dangers of distracted driving. Given 10 year screening recommendations.

## 2022-07-15 NOTE — Assessment & Plan Note (Signed)
Uses flomax 0.4 mg daily and recent PSA stable with urology. Can get rx from [name] if needed.

## 2022-07-15 NOTE — Assessment & Plan Note (Signed)
Taking zoloft 100 mg daily and well controlled. Would like to continue refill when due.

## 2022-07-16 ENCOUNTER — Other Ambulatory Visit: Payer: Self-pay | Admitting: Internal Medicine

## 2022-07-18 NOTE — Telephone Encounter (Signed)
Please advise 

## 2022-08-08 ENCOUNTER — Ambulatory Visit (INDEPENDENT_AMBULATORY_CARE_PROVIDER_SITE_OTHER): Payer: Medicare Other

## 2022-08-08 ENCOUNTER — Encounter: Payer: Self-pay | Admitting: Emergency Medicine

## 2022-08-08 ENCOUNTER — Ambulatory Visit (INDEPENDENT_AMBULATORY_CARE_PROVIDER_SITE_OTHER): Payer: Medicare Other | Admitting: Emergency Medicine

## 2022-08-08 VITALS — BP 146/80 | HR 92 | Temp 98.4°F | Ht 69.0 in | Wt 244.4 lb

## 2022-08-08 DIAGNOSIS — J22 Unspecified acute lower respiratory infection: Secondary | ICD-10-CM | POA: Diagnosis not present

## 2022-08-08 DIAGNOSIS — R059 Cough, unspecified: Secondary | ICD-10-CM | POA: Diagnosis not present

## 2022-08-08 DIAGNOSIS — R051 Acute cough: Secondary | ICD-10-CM

## 2022-08-08 MED ORDER — PREDNISONE 20 MG PO TABS: 40.0000 mg | ORAL_TABLET | Freq: Every day | ORAL | 0 refills | Status: AC

## 2022-08-08 MED ORDER — AZITHROMYCIN 250 MG PO TABS: ORAL_TABLET | ORAL | 0 refills | Status: DC

## 2022-08-08 MED ORDER — HYDROCODONE BIT-HOMATROP MBR 5-1.5 MG/5ML PO SOLN: 5.0000 mL | Freq: Every evening | ORAL | 0 refills | Status: DC | PRN

## 2022-08-08 MED ORDER — BENZONATATE 200 MG PO CAPS
200.0000 mg | ORAL_CAPSULE | Freq: Two times a day (BID) | ORAL | 0 refills | Status: DC | PRN
Start: 2022-08-08 — End: 2022-11-14

## 2022-08-08 NOTE — Assessment & Plan Note (Signed)
Quite active and affecting quality of life. Continue over-the-counter Mucinex DM and cough drops. Start Tessalon 200 mg 3 times daily as needed and Hycodan syrup for bedtime. Advised to rest and stay well-hydrated. Contact the office if no better or worse during the next several days.

## 2022-08-08 NOTE — Patient Instructions (Signed)

## 2022-08-08 NOTE — Progress Notes (Signed)
[name] [name] 71 y.o.   Chief Complaint  Patient presents with   Acute Visit    Ongoing cough x 3 weeks, worse at night     HISTORY OF PRESENT ILLNESS: Acute problem visit today. This is a 71 y.o. male complaining of productive cough and flulike symptoms that started about 3 weeks ago.  Progressively getting worse. Denies fever or chills.  Denies chest pain or difficulty breathing. No other associated symptoms. No other complaints or medical concerns today.  HPI   Prior to Admission medications   Medication Sig Start Date End Date Taking? Authorizing Provider  APPLE CIDER VINEGAR PO Take 1 capsule by mouth daily.   Yes [provider]  calcium carbonate (TUMS - DOSED IN MG ELEMENTAL CALCIUM) 500 MG chewable tablet Chew 2 tablets by mouth 2 (two) times daily as needed for indigestion or heartburn.   Yes [provider]  Ergocalciferol (VITAMIN D2) 50 MCG (2000 UT) TABS Take 2,000 Units by mouth daily.   Yes [provider]  Multiple Vitamin (MULTI-VITAMIN PO) Take 1 tablet by mouth every other day.   Yes [provider]  naproxen (NAPROSYN) 500 MG tablet Take 500 mg by mouth daily as needed for moderate pain.   Yes [provider]  Omega-3 Fatty Acids (FISH OIL) 1200 MG CPDR Take 1,200 mg by mouth daily.   Yes [provider]  sertraline (ZOLOFT) 100 MG tablet TAKE 1 TABLET BY MOUTH EVERY DAY 07/19/22  Yes Hoyt Koch, MD  tamsulosin (FLOMAX) 0.4 MG CAPS capsule Take 0.4 mg by mouth daily. 12/04/14  Yes [provider]  telmisartan (MICARDIS) 80 MG tablet Take 1 tablet (80 mg total) by mouth daily. 07/09/21  Yes Hoyt Koch, MD  Vitamin E 670 MG (1000 UT) CAPS Take 1,000 Units by mouth daily.   Yes [provider]  Zinc 30 MG TABS Take 30 mg by mouth daily.   Yes [provider]    Allergies  Allergen Reactions   Escitalopram Oxalate Other (See Comments)    Anxiety, insomnia    Irbesartan Other (See Comments)    Cough   Penicillins Rash    Blotchy spots    Patient Active Problem List   Diagnosis Date Noted   Encounter for general adult medical examination with abnormal findings 07/09/2021   BPH (benign prostatic hyperplasia) 07/09/2021   MDD (major depressive disorder), recurrent, in full remission ([place]) 07/09/2021   Essential hypertension 10/20/2008    Past Medical History:  Diagnosis Date   Bronchitis    Depression    GERD (gastroesophageal reflux disease)    Hypertension     Past Surgical History:  Procedure Laterality Date   CHOLECYSTECTOMY N/A 02/10/2021   Procedure: LAPAROSCOPIC CHOLECYSTECTOMY;  Surgeon: Erroll Luna, MD;  Location: [facility];  Service: General;  Laterality: N/A;   HERNIA REPAIR     ROTATOR CUFF REPAIR      Social History   Socioeconomic History   Marital status: Married    Spouse name: Not on file   Number of children: Not on file   Years of education: Not on file   Highest education level: Not on file  Occupational History   Not on file  Tobacco Use   Smoking status: Never   Smokeless tobacco: Never  Vaping Use   Vaping Use: Never used  Substance and Sexual Activity   Alcohol use: Yes    Comment: socially   Drug use: No   Sexual  activity: Not on file  Other Topics Concern   Not on file  Social History Narrative   Not on file   Social Determinants of Health   Financial Resource Strain: Not on file  Food Insecurity: Not on file  Transportation Needs: Not on file  Physical Activity: Not on file  Stress: Not on file  Social Connections: Not on file  Intimate Partner Violence: Not on file    Family History  Problem Relation Age of Onset   Emphysema Mother        heart failure indusce emphysema   Heart disease Father    Heart attack Father    Heart disease Sister 49       [id number]     Review of Systems  Constitutional: Negative.  Negative for chills and fever.  HENT:  Positive for congestion.    Respiratory:  Positive for cough, sputum production and wheezing. Negative for hemoptysis and shortness of breath.   Cardiovascular: Negative.  Negative for chest pain and palpitations.  Gastrointestinal:  Negative for abdominal pain, diarrhea, nausea and vomiting.  Genitourinary: Negative.  Negative for dysuria.  Skin: Negative.  Negative for rash.  Neurological:  Positive for headaches. Negative for dizziness.  All other systems reviewed and are negative.  Today's Vitals   08/08/22 1510 08/08/22 1514  BP: (!) 150/86 (!) 146/80  Pulse: 92   Temp: 98.4 F (36.9 C)   TempSrc: Oral   SpO2: 97%   Weight: 244 lb 6 oz (110.8 kg)   Height: '5\' 9"'$  (1.753 m)    Body mass index is 36.09 kg/m.   Physical Exam Vitals reviewed.  Constitutional:      Appearance: Normal appearance.  HENT:     Head: Normocephalic.     Mouth/Throat:     Mouth: Mucous membranes are moist.     Pharynx: Oropharynx is clear.  Eyes:     Extraocular Movements: Extraocular movements intact.     Conjunctiva/sclera: Conjunctivae normal.     Pupils: Pupils are equal, round, and reactive to light.  Cardiovascular:     Rate and Rhythm: Normal rate and regular rhythm.     Pulses: Normal pulses.     Heart sounds: Normal heart sounds.  Pulmonary:     Effort: Pulmonary effort is normal.     Breath sounds: Normal breath sounds.  Musculoskeletal:        General: Normal range of motion.     Cervical back: No tenderness.     Right lower leg: No edema.     Left lower leg: No edema.  Lymphadenopathy:     Cervical: No cervical adenopathy.  Skin:    General: Skin is warm and dry.     Capillary Refill: Capillary refill takes less than 2 seconds.  Neurological:     General: No focal deficit present.     Mental Status: He is alert and oriented to person, place, and time.  Psychiatric:        Mood and Affect: Mood normal.        Behavior: Behavior normal.    DG Chest 2 View  Result Date: 08/08/2022 CLINICAL DATA:   Cough and congestion.  History of bronchitis. EXAM: CHEST - 2 VIEW COMPARISON:  11/25/14 FINDINGS: The heart size and mediastinal contours are within normal limits. Both lungs are clear. The visualized skeletal structures are unremarkable. IMPRESSION: No active cardiopulmonary disease. Electronically Signed   By: Kerby Moors M.D.   On: 08/08/2022 15:48  ASSESSMENT & PLAN: A total of 44 minutes was spent with the patient and counseling/coordination of care regarding preparing for this visit, review of available medical records, review of multiple chronic medical conditions under management, review of all medications, review of today's chest x-ray report, diagnosis of lower respiratory infection and need for antibiotics and systemic corticosteroids, cough management, prognosis, documentation, need for follow-up if no better or worse during the next several days.  Problem List Items Addressed This Visit       Respiratory   Lower respiratory infection - Primary    Clinically stable.  No red flag signs or symptoms. Afebrile in no respiratory distress. No wheezing on exam but intermittent wheezing at home. Non-smoker. Normal chest x-ray.  No pneumonia. Progressive symptoms getting worse over the last 3 weeks. Most likely has secondary bacterial infection. Will benefit from daily azithromycin for 5 days and 40 mg of daily prednisone for inflammation in the airways. Advised to rest and stay well-hydrated. Advised to contact the office if no better or worse during the next several days.      Relevant Medications   azithromycin (ZITHROMAX) 250 MG tablet   predniSONE (DELTASONE) 20 MG tablet   Other Relevant Orders   DG Chest 2 View (Completed)     Other   Acute cough    Quite active and affecting quality of life. Continue over-the-counter Mucinex DM and cough drops. Start Tessalon 200 mg 3 times daily as needed and Hycodan syrup for bedtime. Advised to rest and stay  well-hydrated. Contact the office if no better or worse during the next several days.      Relevant Medications   benzonatate (TESSALON) 200 MG capsule   predniSONE (DELTASONE) 20 MG tablet   HYDROcodone bit-homatropine (HYCODAN) 5-1.5 MG/5ML syrup   Patient Instructions  Cough, Adult A cough helps to clear your throat and lungs. A cough may be a sign of an illness or another medical condition. An acute cough may only last 2-3 weeks, while a chronic cough may last 8 or more weeks. Many things can cause a cough. They include: Germs (viruses or bacteria) that attack the airway. Breathing in things that bother (irritate) your lungs. Allergies. Asthma. Mucus that runs down the back of your throat (postnasal drip). Smoking. Acid backing up from the stomach into the tube that moves food from the mouth to the stomach (gastroesophageal reflux). Some medicines. Lung problems. Other medical conditions, such as heart failure or a blood clot in the lung (pulmonary embolism). Follow these instructions at home: Medicines Take over-the-counter and prescription medicines only as told by your doctor. Talk with your doctor before you take medicines that stop a cough (cough suppressants). Lifestyle  Do not smoke, and try not to be around smoke. Do not use any products that contain nicotine or tobacco, such as cigarettes, e-cigarettes, and chewing tobacco. If you need help quitting, ask your doctor. Drink enough fluid to keep your pee (urine) pale yellow. Avoid caffeine. Do not drink alcohol if your doctor tells you not to drink. General instructions  Watch for any changes in your cough. Tell your doctor about them. Always cover your mouth when you cough. Stay away from things that make you cough, such as perfume, candles, campfire smoke, or cleaning products. If the air is dry, use a cool mist vaporizer or humidifier in your home. If your cough is worse at night, try using extra pillows to raise  your head up higher while you sleep. Rest as needed.  Keep all follow-up visits as told by your doctor. This is important. Contact a doctor if: You have new symptoms. You cough up pus. Your cough does not get better after 2-3 weeks, or your cough gets worse. Cough medicine does not help your cough and you are not sleeping well. You have pain that gets worse or pain that is not helped with medicine. You have a fever. You are losing weight and you do not know why. You have night sweats. Get help right away if: You cough up blood. You have trouble breathing. Your heartbeat is very fast. These symptoms may be an emergency. Do not wait to see if the symptoms will go away. Get medical help right away. Call your local emergency services (911 in the [place] Do not drive yourself to the hospital. Summary A cough helps to clear your throat and lungs. Many things can cause a cough. Take over-the-counter and prescription medicines only as told by your doctor. Always cover your mouth when you cough. Contact a doctor if you have new symptoms or you have a cough that does not get better or gets worse. This information is not intended to replace advice given to you by your health care provider. Make sure you discuss any questions you have with your health care provider. Document Revised: 11/08/2019 Document Reviewed: 10/08/2018 Elsevier Patient Education  [place], MD [facility]

## 2022-08-08 NOTE — Assessment & Plan Note (Signed)
Clinically stable.  No red flag signs or symptoms. Afebrile in no respiratory distress. No wheezing on exam but intermittent wheezing at home. Non-smoker. Normal chest x-ray.  No pneumonia. Progressive symptoms getting worse over the last 3 weeks. Most likely has secondary bacterial infection. Will benefit from daily azithromycin for 5 days and 40 mg of daily prednisone for inflammation in the airways. Advised to rest and stay well-hydrated. Advised to contact the office if no better or worse during the next several days.

## 2022-08-15 DIAGNOSIS — M1711 Unilateral primary osteoarthritis, right knee: Secondary | ICD-10-CM | POA: Diagnosis not present

## 2022-08-16 ENCOUNTER — Telehealth: Payer: Self-pay | Admitting: Internal Medicine

## 2022-08-16 NOTE — Telephone Encounter (Signed)
For our records:  We have received Pre-Op PW for the pt from [facility] and it has been placed in Dr. Nathanial Millman box.  Upon completion please fax to: [phone]

## 2022-08-16 NOTE — Telephone Encounter (Signed)
Currently have them in my folder to place in Dr crawfords box

## 2022-08-28 ENCOUNTER — Other Ambulatory Visit: Payer: Self-pay | Admitting: Internal Medicine

## 2022-08-31 ENCOUNTER — Telehealth: Payer: Self-pay | Admitting: Internal Medicine

## 2022-08-31 DIAGNOSIS — M25561 Pain in right knee: Secondary | ICD-10-CM | POA: Diagnosis not present

## 2022-08-31 DIAGNOSIS — M25661 Stiffness of right knee, not elsewhere classified: Secondary | ICD-10-CM | POA: Diagnosis not present

## 2022-08-31 DIAGNOSIS — M1711 Unilateral primary osteoarthritis, right knee: Secondary | ICD-10-CM | POA: Diagnosis not present

## 2022-08-31 NOTE — Telephone Encounter (Signed)
Have received and will place inside her office box.

## 2022-08-31 NOTE — Telephone Encounter (Signed)
For our records:   We have received Pre-Op PW from [facility] for the pt and it has been placed in Dr. Nathanial Millman boxes.   Upon completion please fax to:  [phone]

## 2022-09-02 ENCOUNTER — Ambulatory Visit: Payer: Medicare Other

## 2022-09-02 ENCOUNTER — Ambulatory Visit (INDEPENDENT_AMBULATORY_CARE_PROVIDER_SITE_OTHER): Payer: Medicare Other | Admitting: Internal Medicine

## 2022-09-02 ENCOUNTER — Encounter: Payer: Self-pay | Admitting: Internal Medicine

## 2022-09-02 VITALS — BP 124/78 | HR 76 | Temp 98.6°F | Ht 69.0 in | Wt 241.0 lb

## 2022-09-02 DIAGNOSIS — M1711 Unilateral primary osteoarthritis, right knee: Secondary | ICD-10-CM | POA: Diagnosis not present

## 2022-09-02 DIAGNOSIS — R051 Acute cough: Secondary | ICD-10-CM

## 2022-09-02 DIAGNOSIS — I10 Essential (primary) hypertension: Secondary | ICD-10-CM | POA: Diagnosis not present

## 2022-09-02 DIAGNOSIS — J320 Chronic maxillary sinusitis: Secondary | ICD-10-CM | POA: Diagnosis not present

## 2022-09-02 DIAGNOSIS — J329 Chronic sinusitis, unspecified: Secondary | ICD-10-CM | POA: Insufficient documentation

## 2022-09-02 LAB — POC INFLUENZA A&B (BINAX/QUICKVUE)
Influenza A, POC: NEGATIVE
Influenza B, POC: NEGATIVE

## 2022-09-02 LAB — POC SOFIA SARS ANTIGEN FIA: SARS Coronavirus 2 Ag: NEGATIVE

## 2022-09-02 LAB — POCT RESPIRATORY SYNCYTIAL VIRUS: RSV Rapid Ag: NEGATIVE

## 2022-09-02 MED ORDER — LEVOFLOXACIN 500 MG PO TABS: 500.0000 mg | ORAL_TABLET | Freq: Every day | ORAL | 0 refills | Status: AC

## 2022-09-02 MED ORDER — HYDROCODONE BIT-HOMATROP MBR 5-1.5 MG/5ML PO SOLN: 5.0000 mL | Freq: Four times a day (QID) | ORAL | 0 refills | Status: AC | PRN

## 2022-09-02 NOTE — Assessment & Plan Note (Signed)
mod, for ENT referral as per request, to f/u any worsening symptoms or concerns

## 2022-09-02 NOTE — Progress Notes (Signed)
Patient ID: [name] [name], male   DOB: 01/14/1951, 71 y.o.   MRN: [id number]        Chief Complaint: cough and congestion for 1 wk       HPI:  [name] [name] is a 71 y.o. male Here with acute onset mild to mod 2-3 days ST, HA, voice change, general weakness and malaise, with prod cough greenish sputum with mild sob doe,, but Pt denies chest pain, wheezing, orthopnea, PND, increased LE swelling, palpitations, dizziness or syncope.   Pt denies polydipsia, polyuria, or new focal neuro s/s.    Pt denies fever, wt loss, night sweats, loss of appetite, or other constitutional symptoms  Also asks for ENT eval for chronic sinusitis for many months, with feeling of chronic left maxillary sinus blockage "90%".          Wt Readings from Last 3 Encounters:  09/02/22 241 lb (109.3 kg)  08/08/22 244 lb 6 oz (110.8 kg)  07/14/22 247 lb (112 kg)   BP Readings from Last 3 Encounters:  09/02/22 124/78  08/08/22 (!) 146/80  07/14/22 138/74         Past Medical History:  Diagnosis Date   Bronchitis    Depression    GERD (gastroesophageal reflux disease)    Hypertension    Past Surgical History:  Procedure Laterality Date   CHOLECYSTECTOMY N/A 02/10/2021   Procedure: LAPAROSCOPIC CHOLECYSTECTOMY;  Surgeon: Erroll Luna, MD;  Location: [place];  Service: General;  Laterality: N/A;   [place]      reports that he has never smoked. He has never used smokeless tobacco. He reports current alcohol use. He reports that he does not use drugs. family history includes Emphysema in his mother; Heart attack in his father; Heart disease in his father; Heart disease (age of onset: 87) in his sister. Allergies  Allergen Reactions   Escitalopram Oxalate Other (See Comments)    Anxiety, insomnia   Irbesartan Other (See Comments)    Cough   Penicillins Rash    Blotchy spots   Current Outpatient Medications on File Prior to Visit  Medication Sig Dispense Refill   APPLE CIDER  VINEGAR PO Take 1 capsule by mouth daily.     azithromycin (ZITHROMAX) 250 MG tablet Sig as indicated 6 tablet 0   benzonatate (TESSALON) 200 MG capsule Take 1 capsule (200 mg total) by mouth 2 (two) times daily as needed for cough. 20 capsule 0   calcium carbonate (TUMS - DOSED IN MG ELEMENTAL CALCIUM) 500 MG chewable tablet Chew 2 tablets by mouth 2 (two) times daily as needed for indigestion or heartburn.     Ergocalciferol (VITAMIN D2) 50 MCG (2000 UT) TABS Take 2,000 Units by mouth daily.     Multiple Vitamin (MULTI-VITAMIN PO) Take 1 tablet by mouth every other day.     naproxen (NAPROSYN) 500 MG tablet Take 500 mg by mouth daily as needed for moderate pain.     Omega-3 Fatty Acids (FISH OIL) 1200 MG CPDR Take 1,200 mg by mouth daily.     sertraline (ZOLOFT) 100 MG tablet TAKE 1 TABLET BY MOUTH EVERY DAY 90 tablet 3   tamsulosin (FLOMAX) 0.4 MG CAPS capsule Take 0.4 mg by mouth daily.  0   telmisartan (MICARDIS) 80 MG tablet TAKE 1 TABLET BY MOUTH EVERY DAY 90 tablet 3   Vitamin E 670 MG (1000 UT) CAPS Take 1,000 Units by mouth daily.  Zinc 30 MG TABS Take 30 mg by mouth daily.     No current facility-administered medications on file prior to visit.        ROS:  All others reviewed and negative.  Objective        PE:  BP 124/78 (BP Location: Right Arm, Patient Position: Sitting, Cuff Size: Large)   Pulse 76   Temp 98.6 F (37 C) (Oral)   Ht '5\' 9"'$  (1.753 m)   Wt 241 lb (109.3 kg)   SpO2 97%   BMI 35.59 kg/m                 Constitutional: Pt appears in NAD               HENT: Head: NCAT.                Right Ear: External ear normal.                 Left Ear: External ear normal. Bilat tm's with mild erythema.  Max sinus areas non tender.  Pharynx with mild erythema, no exudate               Eyes: . Pupils are equal, round, and reactive to light. Conjunctivae and EOM are normal               [name]: without d/c or deformity               Neck: Neck supple. Gross normal ROM                Cardiovascular: Normal rate and regular rhythm.                 Pulmonary/Chest: Effort normal and breath sounds decreased without rales or wheezing.                               Neurological: Pt is alert. At baseline orientation, motor grossly intact               Skin: Skin is warm. No rashes, no other new lesions, LE edema - none               Psychiatric: Pt behavior is normal without agitation  - denies depression  Micro: none  Cardiac tracings I have personally interpreted today:  none  Pertinent Radiological findings (summarize): none   Lab Results  Component Value Date   WBC 5.6 07/07/2022   HGB 14.9 07/07/2022   HCT 44.0 07/07/2022   PLT 165.0 07/07/2022   GLUCOSE 105 (H) 07/07/2022   CHOL 195 07/07/2022   TRIG 45.0 07/07/2022   HDL 76.80 07/07/2022   LDLCALC 109 (H) 07/07/2022   ALT 22 07/07/2022   AST 17 07/07/2022   NA 135 07/07/2022   K 4.0 07/07/2022   CL 99 07/07/2022   CREATININE 1.02 07/07/2022   BUN 22 07/07/2022   CO2 28 07/07/2022   INR 0.97 12/20/2014   POCT - COVID - neg, flu A/B - neg, RSV - neg  Assessment/Plan:  [name] [name] is a 71 y.o. White or Caucasian [1] male with  has a past medical history of Bronchitis, Depression, GERD (gastroesophageal reflux disease), and Hypertension.  Acute cough Mild to mod, c/w bronchitis vs pna, for cxr, antibx course - levaquin 50 mg daily and cough med prn,,  to f/u any worsening symptoms or concerns  Chronic sinusitis mod,  for ENT referral as per request, to f/u any worsening symptoms or concerns   Essential hypertension BP Readings from Last 3 Encounters:  09/02/22 124/78  08/08/22 (!) 146/80  07/14/22 138/74   Stable, pt to continue medical treatment micardis 80 mg   Arthritis of right knee I advised pt ot see ENT prior to ortho f/u, as any procedure would need to be completed prior to TKR, which he is not wanting soon anyway  Followup: Return if symptoms worsen or fail to  improve.  Cathlean Cower, MD 09/02/2022 7:47 PM [facility]

## 2022-09-02 NOTE — Assessment & Plan Note (Signed)
BP Readings from Last 3 Encounters:  09/02/22 124/78  08/08/22 (!) 146/80  07/14/22 138/74   Stable, pt to continue medical treatment micardis 80 mg

## 2022-09-02 NOTE — Assessment & Plan Note (Signed)
Mild to mod, c/w bronchitis vs pna, for cxr, antibx course - levaquin 50 mg daily and cough med prn,,  to f/u any worsening symptoms or concerns

## 2022-09-02 NOTE — Patient Instructions (Signed)
Your COVID, flu and RSV testing is negative  Please take all new medication as prescribed - the antibiotic, cough medicine as needed  Please continue all other medications as before, and refills have been done if requested.  Please have the pharmacy call with any other refills you may need.  Please keep your appointments with your specialists as you may have planned  Please go to the XRAY Department in the first floor for the x-ray testing  You will be contacted by phone if any changes need to be made immediately.  Otherwise, you will receive a letter about your results with an explanation, but please check with MyChart first.  Please remember to sign up for MyChart if you have not done so, as this will be important to you in the future with finding out test results, communicating by private email, and scheduling acute appointments online when needed.

## 2022-09-02 NOTE — Assessment & Plan Note (Signed)
I advised pt ot see ENT prior to ortho f/u, as any procedure would need to be completed prior to TKR, which he is not wanting soon anyway

## 2022-09-30 ENCOUNTER — Ambulatory Visit: Payer: Medicare Other | Admitting: Internal Medicine

## 2022-10-12 ENCOUNTER — Telehealth: Payer: Self-pay

## 2022-10-12 DIAGNOSIS — Z01818 Encounter for other preprocedural examination: Secondary | ICD-10-CM | POA: Diagnosis not present

## 2022-10-12 NOTE — Telephone Encounter (Signed)
completed

## 2022-10-25 DIAGNOSIS — M1711 Unilateral primary osteoarthritis, right knee: Secondary | ICD-10-CM | POA: Diagnosis not present

## 2022-10-25 DIAGNOSIS — G8918 Other acute postprocedural pain: Secondary | ICD-10-CM | POA: Diagnosis not present

## 2022-10-25 HISTORY — PX: OTHER SURGICAL HISTORY: SHX169

## 2022-10-28 DIAGNOSIS — M25561 Pain in right knee: Secondary | ICD-10-CM | POA: Diagnosis not present

## 2022-10-28 DIAGNOSIS — M25661 Stiffness of right knee, not elsewhere classified: Secondary | ICD-10-CM | POA: Diagnosis not present

## 2022-10-31 DIAGNOSIS — M25661 Stiffness of right knee, not elsewhere classified: Secondary | ICD-10-CM | POA: Diagnosis not present

## 2022-10-31 DIAGNOSIS — M25561 Pain in right knee: Secondary | ICD-10-CM | POA: Diagnosis not present

## 2022-11-04 DIAGNOSIS — M25661 Stiffness of right knee, not elsewhere classified: Secondary | ICD-10-CM | POA: Diagnosis not present

## 2022-11-04 DIAGNOSIS — M25561 Pain in right knee: Secondary | ICD-10-CM | POA: Diagnosis not present

## 2022-11-04 DIAGNOSIS — Z4789 Encounter for other orthopedic aftercare: Secondary | ICD-10-CM | POA: Diagnosis not present

## 2022-11-10 DIAGNOSIS — M25661 Stiffness of right knee, not elsewhere classified: Secondary | ICD-10-CM | POA: Diagnosis not present

## 2022-11-10 DIAGNOSIS — M25561 Pain in right knee: Secondary | ICD-10-CM | POA: Diagnosis not present

## 2022-11-14 ENCOUNTER — Encounter (HOSPITAL_COMMUNITY): Payer: Self-pay

## 2022-11-14 ENCOUNTER — Inpatient Hospital Stay (HOSPITAL_COMMUNITY)
Admission: AD | Admit: 2022-11-14 | Discharge: 2022-11-19 | DRG: 486 | Disposition: A | Discharge status: Home Health | Payer: Medicare Other | Attending: Specialist | Admitting: Specialist

## 2022-11-14 ENCOUNTER — Other Ambulatory Visit: Payer: Self-pay

## 2022-11-14 DIAGNOSIS — Z7982 Long term (current) use of aspirin: Secondary | ICD-10-CM

## 2022-11-14 DIAGNOSIS — T8131XA Disruption of external operation (surgical) wound, not elsewhere classified, initial encounter: Secondary | ICD-10-CM | POA: Diagnosis not present

## 2022-11-14 DIAGNOSIS — M00861 Arthritis due to other bacteria, right knee: Secondary | ICD-10-CM | POA: Diagnosis not present

## 2022-11-14 DIAGNOSIS — G8918 Other acute postprocedural pain: Secondary | ICD-10-CM | POA: Diagnosis not present

## 2022-11-14 DIAGNOSIS — R443 Hallucinations, unspecified: Secondary | ICD-10-CM | POA: Diagnosis not present

## 2022-11-14 DIAGNOSIS — A4102 Sepsis due to Methicillin resistant Staphylococcus aureus: Secondary | ICD-10-CM

## 2022-11-14 DIAGNOSIS — I10 Essential (primary) hypertension: Secondary | ICD-10-CM | POA: Diagnosis present

## 2022-11-14 DIAGNOSIS — Z888 Allergy status to other drugs, medicaments and biological substances status: Secondary | ICD-10-CM | POA: Diagnosis not present

## 2022-11-14 DIAGNOSIS — Z79899 Other long term (current) drug therapy: Secondary | ICD-10-CM | POA: Diagnosis not present

## 2022-11-14 DIAGNOSIS — F32A Depression, unspecified: Secondary | ICD-10-CM | POA: Diagnosis present

## 2022-11-14 DIAGNOSIS — K219 Gastro-esophageal reflux disease without esophagitis: Secondary | ICD-10-CM | POA: Diagnosis not present

## 2022-11-14 DIAGNOSIS — N289 Disorder of kidney and ureter, unspecified: Secondary | ICD-10-CM | POA: Diagnosis not present

## 2022-11-14 DIAGNOSIS — Y831 Surgical operation with implant of artificial internal device as the cause of abnormal reaction of the patient, or of later complication, without mention of misadventure at the time of the procedure: Secondary | ICD-10-CM | POA: Diagnosis present

## 2022-11-14 DIAGNOSIS — D649 Anemia, unspecified: Secondary | ICD-10-CM | POA: Diagnosis not present

## 2022-11-14 DIAGNOSIS — T8140XA Infection following a procedure, unspecified, initial encounter: Secondary | ICD-10-CM | POA: Diagnosis not present

## 2022-11-14 DIAGNOSIS — T8453XA Infection and inflammatory reaction due to internal right knee prosthesis, initial encounter: Secondary | ICD-10-CM | POA: Diagnosis not present

## 2022-11-14 DIAGNOSIS — M009 Pyogenic arthritis, unspecified: Principal | ICD-10-CM | POA: Diagnosis present

## 2022-11-14 DIAGNOSIS — Z791 Long term (current) use of non-steroidal anti-inflammatories (NSAID): Secondary | ICD-10-CM | POA: Diagnosis not present

## 2022-11-14 DIAGNOSIS — Z880 Allergy status to penicillin: Secondary | ICD-10-CM

## 2022-11-14 DIAGNOSIS — Z8249 Family history of ischemic heart disease and other diseases of the circulatory system: Secondary | ICD-10-CM | POA: Diagnosis not present

## 2022-11-14 DIAGNOSIS — Z6833 Body mass index (BMI) 33.0-33.9, adult: Secondary | ICD-10-CM

## 2022-11-14 DIAGNOSIS — E669 Obesity, unspecified: Secondary | ICD-10-CM | POA: Diagnosis present

## 2022-11-14 DIAGNOSIS — B9561 Methicillin susceptible Staphylococcus aureus infection as the cause of diseases classified elsewhere: Secondary | ICD-10-CM | POA: Diagnosis present

## 2022-11-14 DIAGNOSIS — Z825 Family history of asthma and other chronic lower respiratory diseases: Secondary | ICD-10-CM

## 2022-11-14 DIAGNOSIS — Z96651 Presence of right artificial knee joint: Secondary | ICD-10-CM | POA: Diagnosis not present

## 2022-11-14 LAB — SEDIMENTATION RATE: Sed Rate: 97 mm/hr — ABNORMAL HIGH (ref 0–16)

## 2022-11-14 LAB — HIV ANTIBODY (ROUTINE TESTING W REFLEX): HIV Screen 4th Generation wRfx: NONREACTIVE

## 2022-11-14 LAB — BASIC METABOLIC PANEL
Anion gap: 13 (ref 5–15)
BUN: 36 mg/dL — ABNORMAL HIGH (ref 8–23)
CO2: 24 mmol/L (ref 22–32)
Calcium: 9.6 mg/dL (ref 8.9–10.3)
Chloride: 93 mmol/L — ABNORMAL LOW (ref 98–111)
Creatinine, Ser: 1.94 mg/dL — ABNORMAL HIGH (ref 0.61–1.24)
GFR, Estimated: 37 mL/min — ABNORMAL LOW (ref >60)
Glucose, Bld: 128 mg/dL — ABNORMAL HIGH (ref 70–99)
Potassium: 4.2 mmol/L (ref 3.5–5.1)
Sodium: 130 mmol/L — ABNORMAL LOW (ref 135–145)

## 2022-11-14 LAB — CBC
HCT: 37.2 % — ABNORMAL LOW (ref 39.0–52.0)
Hemoglobin: 12.4 g/dL — ABNORMAL LOW (ref 13.0–17.0)
MCH: 31.6 pg (ref 26.0–34.0)
MCHC: 33.3 g/dL (ref 30.0–36.0)
MCV: 94.7 fL (ref 80.0–100.0)
Platelets: 214 10*3/uL (ref 150–400)
RBC: 3.93 MIL/uL — ABNORMAL LOW (ref 4.22–5.81)
RDW: 13.6 % (ref 11.5–15.5)
WBC: 9.5 10*3/uL (ref 4.0–10.5)
nRBC: 0 % (ref 0.0–0.2)

## 2022-11-14 LAB — C-REACTIVE PROTEIN: CRP: 39.8 mg/dL — ABNORMAL HIGH (ref <1.0)

## 2022-11-14 MED ORDER — METHOCARBAMOL 500 MG PO TABS
500.0000 mg | ORAL_TABLET | Freq: Four times a day (QID) | ORAL | Status: DC | PRN
  Administered 2022-11-14 – 2022-11-19 (×4): 500 mg via ORAL
  Filled 2022-11-14 (×4): qty 1

## 2022-11-14 MED ORDER — ZOLPIDEM TARTRATE 5 MG PO TABS
5.0000 mg | ORAL_TABLET | Freq: Every evening | ORAL | Status: DC | PRN
  Administered 2022-11-16 – 2022-11-17 (×2): 5 mg via ORAL
  Filled 2022-11-14 (×2): qty 1

## 2022-11-14 MED ORDER — HYDROMORPHONE HCL 2 MG PO TABS
2.0000 mg | ORAL_TABLET | ORAL | Status: DC | PRN
  Administered 2022-11-15: 2 mg via ORAL
  Filled 2022-11-14: qty 1

## 2022-11-14 MED ORDER — HYDROMORPHONE HCL 1 MG/ML IJ SOLN
0.5000 mg | INTRAMUSCULAR | Status: DC | PRN
  Administered 2022-11-14 – 2022-11-15 (×3): 1 mg via INTRAVENOUS
  Filled 2022-11-14 (×3): qty 1

## 2022-11-14 MED ORDER — ACETAMINOPHEN 325 MG PO TABS: 325.0000 mg | ORAL_TABLET | Freq: Four times a day (QID) | ORAL | Status: DC | PRN

## 2022-11-14 MED ORDER — TRAMADOL HCL 50 MG PO TABS
50.0000 mg | ORAL_TABLET | Freq: Four times a day (QID) | ORAL | Status: DC
  Administered 2022-11-14 – 2022-11-19 (×18): 50 mg via ORAL
  Filled 2022-11-14 (×18): qty 1

## 2022-11-14 MED ORDER — METHOCARBAMOL 1000 MG/10ML IJ SOLN: 500.0000 mg | Freq: Four times a day (QID) | INTRAVENOUS | Status: DC | PRN

## 2022-11-14 MED ORDER — OXYCODONE HCL 5 MG PO TABS
5.0000 mg | ORAL_TABLET | ORAL | Status: DC | PRN
  Administered 2022-11-14 – 2022-11-19 (×8): 10 mg via ORAL
  Administered 2022-11-19 (×2): 5 mg via ORAL
  Filled 2022-11-14 (×2): qty 2
  Filled 2022-11-14: qty 1
  Filled 2022-11-14: qty 2
  Filled 2022-11-14: qty 1
  Filled 2022-11-14 (×5): qty 2

## 2022-11-14 MED ORDER — DIPHENHYDRAMINE HCL 12.5 MG/5ML PO ELIX
12.5000 mg | ORAL_SOLUTION | ORAL | Status: DC | PRN
  Filled 2022-11-14: qty 10

## 2022-11-14 MED ORDER — ACETAMINOPHEN 500 MG PO TABS
1000.0000 mg | ORAL_TABLET | Freq: Four times a day (QID) | ORAL | Status: AC
  Administered 2022-11-14 – 2022-11-15 (×3): 1000 mg via ORAL
  Filled 2022-11-14 (×3): qty 2

## 2022-11-14 NOTE — Plan of Care (Signed)
Problem: Pain Managment: Goal: General experience of comfort will improve Outcome: Not Progressing   Problem: Safety: Goal: Ability to remain free from injury will improve Outcome: Progressing   Problem: Coping: Goal: Level of anxiety will decrease Outcome: Progressing   Ivan Anchors, RN 11/14/22 7:46 PM

## 2022-11-15 ENCOUNTER — Inpatient Hospital Stay (HOSPITAL_COMMUNITY): Payer: Medicare Other | Admitting: Anesthesiology

## 2022-11-15 ENCOUNTER — Encounter (HOSPITAL_COMMUNITY)
Admission: AD | Disposition: A | Discharge status: Home Health | Payer: Self-pay | Source: Home / Self Care | Attending: Specialist

## 2022-11-15 ENCOUNTER — Encounter (HOSPITAL_COMMUNITY): Payer: Self-pay | Admitting: Specialist

## 2022-11-15 DIAGNOSIS — N289 Disorder of kidney and ureter, unspecified: Secondary | ICD-10-CM

## 2022-11-15 DIAGNOSIS — D649 Anemia, unspecified: Secondary | ICD-10-CM

## 2022-11-15 DIAGNOSIS — M009 Pyogenic arthritis, unspecified: Secondary | ICD-10-CM | POA: Diagnosis not present

## 2022-11-15 DIAGNOSIS — I10 Essential (primary) hypertension: Secondary | ICD-10-CM

## 2022-11-15 HISTORY — PX: I & D KNEE WITH POLY EXCHANGE: SHX5024

## 2022-11-15 SURGERY — IRRIGATION AND DEBRIDEMENT KNEE WITH POLY EXCHANGE
Anesthesia: Regional | Site: Knee | Laterality: Right

## 2022-11-15 MED ORDER — AMISULPRIDE (ANTIEMETIC) 5 MG/2ML IV SOLN: 10.0000 mg | Freq: Once | INTRAVENOUS | Status: DC | PRN

## 2022-11-15 MED ORDER — VANCOMYCIN HCL 1750 MG/350ML IV SOLN
1750.0000 mg | INTRAVENOUS | Status: DC
  Administered 2022-11-15: 1750 mg via INTRAVENOUS
  Filled 2022-11-15: qty 350

## 2022-11-15 MED ORDER — PROPOFOL 10 MG/ML IV BOLUS
INTRAVENOUS | Status: DC | PRN
  Administered 2022-11-15: 200 mg via INTRAVENOUS

## 2022-11-15 MED ORDER — FENTANYL CITRATE (PF) 100 MCG/2ML IJ SOLN
INTRAMUSCULAR | Status: AC
  Filled 2022-11-15: qty 2

## 2022-11-15 MED ORDER — KETOROLAC TROMETHAMINE 15 MG/ML IJ SOLN
INTRAMUSCULAR | Status: AC
  Administered 2022-11-15: 15 mg via INTRAVENOUS
  Filled 2022-11-15: qty 1

## 2022-11-15 MED ORDER — TAMSULOSIN HCL 0.4 MG PO CAPS
0.4000 mg | ORAL_CAPSULE | Freq: Every day | ORAL | Status: DC
  Administered 2022-11-15 – 2022-11-19 (×5): 0.4 mg via ORAL
  Filled 2022-11-15 (×5): qty 1

## 2022-11-15 MED ORDER — EPINEPHRINE PF 1 MG/ML IJ SOLN
INTRAMUSCULAR | Status: AC
  Filled 2022-11-15: qty 1

## 2022-11-15 MED ORDER — ACETAMINOPHEN 10 MG/ML IV SOLN
INTRAVENOUS | Status: AC
  Administered 2022-11-15: 1000 mg
  Filled 2022-11-15: qty 100

## 2022-11-15 MED ORDER — DEXMEDETOMIDINE HCL IN NACL 80 MCG/20ML IV SOLN
INTRAVENOUS | Status: DC | PRN
  Administered 2022-11-15: 12 ug via BUCCAL

## 2022-11-15 MED ORDER — LACTATED RINGERS IV SOLN: INTRAVENOUS | Status: DC

## 2022-11-15 MED ORDER — BUPIVACAINE HCL (PF) 0.25 % IJ SOLN
INTRAMUSCULAR | Status: DC | PRN
  Administered 2022-11-15: 30 mL

## 2022-11-15 MED ORDER — IPRATROPIUM-ALBUTEROL 0.5-2.5 (3) MG/3ML IN SOLN: 3.0000 mL | Freq: Once | RESPIRATORY_TRACT | Status: DC

## 2022-11-15 MED ORDER — ONDANSETRON HCL 4 MG/2ML IJ SOLN: 4.0000 mg | Freq: Once | INTRAMUSCULAR | Status: DC | PRN

## 2022-11-15 MED ORDER — SODIUM CHLORIDE (PF) 0.9 % IJ SOLN
INTRAMUSCULAR | Status: AC
  Filled 2022-11-15: qty 50

## 2022-11-15 MED ORDER — SALINE SPRAY 0.65 % NA SOLN: 1.0000 | NASAL | Status: DC | PRN

## 2022-11-15 MED ORDER — ASPIRIN 81 MG PO CHEW
81.0000 mg | CHEWABLE_TABLET | Freq: Two times a day (BID) | ORAL | Status: DC
  Administered 2022-11-16 – 2022-11-19 (×7): 81 mg via ORAL
  Filled 2022-11-15 (×7): qty 1

## 2022-11-15 MED ORDER — HYDROMORPHONE HCL 1 MG/ML IJ SOLN
0.5000 mg | INTRAMUSCULAR | Status: AC | PRN
  Administered 2022-11-15: 0.5 mg via INTRAVENOUS

## 2022-11-15 MED ORDER — SODIUM CHLORIDE 0.9 % IR SOLN
Status: DC | PRN
  Administered 2022-11-15 (×2): 3000 mL

## 2022-11-15 MED ORDER — BUPIVACAINE-EPINEPHRINE (PF) 0.5% -1:200000 IJ SOLN
INTRAMUSCULAR | Status: DC | PRN
  Administered 2022-11-15: 30 mL via PERINEURAL

## 2022-11-15 MED ORDER — DEXAMETHASONE SODIUM PHOSPHATE 10 MG/ML IJ SOLN
INTRAMUSCULAR | Status: DC | PRN
  Administered 2022-11-15: 8 mg via INTRAVENOUS

## 2022-11-15 MED ORDER — HYDROMORPHONE HCL 1 MG/ML IJ SOLN
INTRAMUSCULAR | Status: AC
  Administered 2022-11-15: 0.5 mg via INTRAVENOUS
  Filled 2022-11-15: qty 1

## 2022-11-15 MED ORDER — CEFAZOLIN SODIUM-DEXTROSE 2-4 GM/100ML-% IV SOLN
2.0000 g | INTRAVENOUS | Status: AC
  Administered 2022-11-15: 2 g via INTRAVENOUS
  Filled 2022-11-15: qty 100

## 2022-11-15 MED ORDER — IRBESARTAN 75 MG PO TABS
75.0000 mg | ORAL_TABLET | Freq: Every day | ORAL | Status: DC
  Administered 2022-11-16 – 2022-11-19 (×4): 75 mg via ORAL
  Filled 2022-11-15 (×4): qty 1

## 2022-11-15 MED ORDER — CEFAZOLIN SODIUM-DEXTROSE 2-4 GM/100ML-% IV SOLN
2.0000 g | Freq: Three times a day (TID) | INTRAVENOUS | Status: DC
  Administered 2022-11-16 – 2022-11-19 (×11): 2 g via INTRAVENOUS
  Filled 2022-11-15 (×11): qty 100

## 2022-11-15 MED ORDER — SERTRALINE HCL 100 MG PO TABS
100.0000 mg | ORAL_TABLET | Freq: Every day | ORAL | Status: DC
  Administered 2022-11-15 – 2022-11-19 (×5): 100 mg via ORAL
  Filled 2022-11-15 (×5): qty 1

## 2022-11-15 MED ORDER — FENTANYL CITRATE PF 50 MCG/ML IJ SOSY
25.0000 ug | PREFILLED_SYRINGE | INTRAMUSCULAR | Status: DC | PRN
  Administered 2022-11-15: 50 ug via INTRAVENOUS

## 2022-11-15 MED ORDER — METHOCARBAMOL 500 MG IVPB - SIMPLE MED: 500.0000 mg | Freq: Once | INTRAVENOUS | Status: AC

## 2022-11-15 MED ORDER — DEXAMETHASONE SODIUM PHOSPHATE 10 MG/ML IJ SOLN
INTRAMUSCULAR | Status: AC
  Filled 2022-11-15: qty 1

## 2022-11-15 MED ORDER — PHENYLEPHRINE 80 MCG/ML (10ML) SYRINGE FOR IV PUSH (FOR BLOOD PRESSURE SUPPORT)
PREFILLED_SYRINGE | INTRAVENOUS | Status: DC | PRN
  Administered 2022-11-15: 80 ug via INTRAVENOUS
  Administered 2022-11-15 (×3): 160 ug via INTRAVENOUS

## 2022-11-15 MED ORDER — ONDANSETRON HCL 4 MG/2ML IJ SOLN
INTRAMUSCULAR | Status: DC | PRN
  Administered 2022-11-15: 4 mg via INTRAVENOUS

## 2022-11-15 MED ORDER — ONDANSETRON HCL 4 MG/2ML IJ SOLN
INTRAMUSCULAR | Status: AC
  Filled 2022-11-15: qty 2

## 2022-11-15 MED ORDER — FENTANYL CITRATE PF 50 MCG/ML IJ SOSY
PREFILLED_SYRINGE | INTRAMUSCULAR | Status: AC
  Administered 2022-11-15: 50 ug via INTRAVENOUS
  Filled 2022-11-15: qty 1

## 2022-11-15 MED ORDER — KETOROLAC TROMETHAMINE 15 MG/ML IJ SOLN: 15.0000 mg | Freq: Once | INTRAMUSCULAR | Status: AC | PRN

## 2022-11-15 MED ORDER — FENTANYL CITRATE PF 50 MCG/ML IJ SOSY
PREFILLED_SYRINGE | INTRAMUSCULAR | Status: AC
  Administered 2022-11-15: 50 ug via INTRAVENOUS
  Filled 2022-11-15: qty 2

## 2022-11-15 MED ORDER — BUPIVACAINE HCL 0.25 % IJ SOLN
INTRAMUSCULAR | Status: AC
  Filled 2022-11-15: qty 1

## 2022-11-15 MED ORDER — 0.9 % SODIUM CHLORIDE (POUR BTL) OPTIME
TOPICAL | Status: DC | PRN
  Administered 2022-11-15: 1000 mL

## 2022-11-15 MED ORDER — PROPOFOL 10 MG/ML IV BOLUS
INTRAVENOUS | Status: AC
  Filled 2022-11-15: qty 20

## 2022-11-15 MED ORDER — LIDOCAINE HCL (PF) 2 % IJ SOLN
INTRAMUSCULAR | Status: AC
  Filled 2022-11-15: qty 5

## 2022-11-15 MED ORDER — FENTANYL CITRATE (PF) 100 MCG/2ML IJ SOLN
INTRAMUSCULAR | Status: DC | PRN
  Administered 2022-11-15 (×6): 50 ug via INTRAVENOUS

## 2022-11-15 MED ORDER — FENTANYL CITRATE PF 50 MCG/ML IJ SOSY
50.0000 ug | PREFILLED_SYRINGE | INTRAMUSCULAR | Status: AC
  Administered 2022-11-15 (×2): 50 ug via INTRAVENOUS
  Filled 2022-11-15: qty 2

## 2022-11-15 MED ORDER — DEXMEDETOMIDINE HCL IN NACL 80 MCG/20ML IV SOLN
INTRAVENOUS | Status: AC
  Filled 2022-11-15: qty 20

## 2022-11-15 MED ORDER — STERILE WATER FOR IRRIGATION IR SOLN
Status: DC | PRN
  Administered 2022-11-15: 1000 mL

## 2022-11-15 MED ORDER — LIDOCAINE 2% (20 MG/ML) 5 ML SYRINGE
INTRAMUSCULAR | Status: DC | PRN
  Administered 2022-11-15: 60 mg via INTRAVENOUS

## 2022-11-15 MED ORDER — METHOCARBAMOL 500 MG IVPB - SIMPLE MED
INTRAVENOUS | Status: AC
  Administered 2022-11-15: 500 mg via INTRAVENOUS
  Filled 2022-11-15: qty 55

## 2022-11-15 MED ORDER — HYDROMORPHONE HCL 1 MG/ML IJ SOLN: 0.5000 mg | INTRAMUSCULAR | Status: DC | PRN

## 2022-11-15 MED ORDER — KETOROLAC TROMETHAMINE 30 MG/ML IJ SOLN
INTRAMUSCULAR | Status: AC
  Filled 2022-11-15: qty 1

## 2022-11-15 MED ORDER — IPRATROPIUM-ALBUTEROL 0.5-2.5 (3) MG/3ML IN SOLN
RESPIRATORY_TRACT | Status: AC
  Administered 2022-11-15: 3 mL via RESPIRATORY_TRACT
  Filled 2022-11-15: qty 3

## 2022-11-15 MED ORDER — MIDAZOLAM HCL 2 MG/2ML IJ SOLN
1.0000 mg | INTRAMUSCULAR | Status: DC
  Filled 2022-11-15: qty 2

## 2022-11-15 SURGICAL SUPPLY — 50 items
BAG COUNTER SPONGE SURGICOUNT (BAG) IMPLANT
BAG ZIPLOCK 12X15 (MISCELLANEOUS) ×1 IMPLANT
BLADE SAG 18X100X1.27 (BLADE) IMPLANT
BLADE SAGITTAL 25.0X1.19X90 (BLADE) IMPLANT
BNDG ELASTIC 4X5.8 VLCR STR LF (GAUZE/BANDAGES/DRESSINGS) IMPLANT
BNDG ELASTIC 6X5.8 VLCR STR LF (GAUZE/BANDAGES/DRESSINGS) ×1 IMPLANT
COVER SURGICAL LIGHT HANDLE (MISCELLANEOUS) ×1 IMPLANT
CUFF TOURN SGL QUICK 34 (TOURNIQUET CUFF) ×1
CUFF TRNQT CYL 34X4.125X (TOURNIQUET CUFF) ×1 IMPLANT
DERMABOND ADVANCED .7 DNX12 (GAUZE/BANDAGES/DRESSINGS) IMPLANT
DRAPE INCISE IOBAN 66X45 STRL (DRAPES) ×3 IMPLANT
DRAPE U-SHAPE 47X51 STRL (DRAPES) ×1 IMPLANT
DRSG AQUACEL AG ADV 3.5X10 (GAUZE/BANDAGES/DRESSINGS) IMPLANT
DRSG AQUACEL AG ADV 3.5X14 (GAUZE/BANDAGES/DRESSINGS) IMPLANT
DURAPREP 26ML APPLICATOR (WOUND CARE) ×2 IMPLANT
ELECT REM PT RETURN 15FT ADLT (MISCELLANEOUS) ×1 IMPLANT
EVACUATOR 1/8 PVC DRAIN (DRAIN) IMPLANT
GLOVE BIOGEL PI IND STRL 7.5 (GLOVE) ×1 IMPLANT
GLOVE BIOGEL PI IND STRL 8 (GLOVE) ×1 IMPLANT
GLOVE ECLIPSE 8.0 STRL XLNG CF (GLOVE) ×1 IMPLANT
GLOVE SURG SS PI 7.0 STRL IVOR (GLOVE) ×1 IMPLANT
GOWN SPEC L4 XLG W/TWL (GOWN DISPOSABLE) ×2 IMPLANT
HANDPIECE INTERPULSE COAX TIP (DISPOSABLE) ×1
IMMOBILIZER KNEE 20 (SOFTGOODS) ×1 IMPLANT
IMMOBILIZER KNEE 20 THIGH 36 (SOFTGOODS) IMPLANT
INSERT TIB ATTUNE RP SZ7X16 (Insert) IMPLANT
JET LAVAGE IRRISEPT WOUND (IRRIGATION / IRRIGATOR) ×1
KIT TURNOVER KIT A (KITS) IMPLANT
LAVAGE JET IRRISEPT WOUND (IRRIGATION / IRRIGATOR) ×1 IMPLANT
MANIFOLD NEPTUNE II (INSTRUMENTS) ×1 IMPLANT
NS IRRIG 1000ML POUR BTL (IV SOLUTION) ×1 IMPLANT
PACK TOTAL KNEE CUSTOM (KITS) ×1 IMPLANT
PROTECTOR NERVE ULNAR (MISCELLANEOUS) ×1 IMPLANT
SET HNDPC FAN SPRY TIP SCT (DISPOSABLE) ×1 IMPLANT
SET PAD KNEE POSITIONER (MISCELLANEOUS) ×1 IMPLANT
SPIKE FLUID TRANSFER (MISCELLANEOUS) ×1 IMPLANT
STAPLER VISISTAT 35W (STAPLE) IMPLANT
SUT MNCRL AB 3-0 PS2 18 (SUTURE) ×1 IMPLANT
SUT STRATAFIX 1PDS 45CM VIOLET (SUTURE) IMPLANT
SUT VIC AB 0 CT1 27 (SUTURE) ×1
SUT VIC AB 0 CT1 27XBRD ANBCTR (SUTURE) IMPLANT
SUT VIC AB 1 CT1 27 (SUTURE) ×3
SUT VIC AB 1 CT1 27XBRD ANTBC (SUTURE) ×1 IMPLANT
SUT VIC AB 2-0 CT1 27 (SUTURE) ×3
SUT VIC AB 2-0 CT1 TAPERPNT 27 (SUTURE) ×1 IMPLANT
SUT VLOC 180 0 24IN GS25 (SUTURE) ×1 IMPLANT
SWAB COLLECTION DEVICE MRSA (MISCELLANEOUS) IMPLANT
SWAB CULTURE ESWAB REG 1ML (MISCELLANEOUS) IMPLANT
TRAY FOLEY MTR SLVR 16FR STAT (SET/KITS/TRAYS/PACK) IMPLANT
WRAP KNEE MAXI GEL POST OP (GAUZE/BANDAGES/DRESSINGS) ×1 IMPLANT

## 2022-11-15 NOTE — Op Note (Signed)
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  This confirmed the superficial deep infection. Operative details patient was counseled in the holding acrylics identified marked signed appropriate IV started sedation given block administered therapist uninvolved leg take the operative room placed supine position under general anesthesia abductor canal block in the in the preop area following this lower extremity was elevated prepped with DuraPrep and draped in sterile fashion after timeout exsanguinated up to the top of the R include the area of infection and tourniquet plated 350 mL mercury.  The previous midline incision was utilized with ABD dehisced already the rest was opened up extending proximal distal to normal tissue there is exuberant thick red fibrinous exudate and scar tissue in the subcutaneous area.  I meticulous medial parapatellar arthrotomy was performed posteromedial soft tissue release was done protected but had been released previously and a lateral release to the area had been released previously.  I then meticulously.  Of the cocoon of scar tissue off of the normal tissue in the subcutaneous area and the knee joint itself took quite some time. aI then Irrigated with 6 L of Saline Pulsatile Lavage.  The probably from the previous surgery was  removed without complication or problem protected neurovascular structures.  Following the copious irrigation and meticulous debridement the osteotomewasutilizedwiththeplasticoffthetibial the post was cut partially with a saw and easily removed being very meticulous to not damage the posterior neurovascular structures.  This sounds a post removal [place] the knee was thoroughly irrigated and debrided as was intercartilaginous region.  Following 6 L of saline and multiple rounds of protoson we replaced the plastic on the tibia with a size 7 x 16 mm meters insert.  Also reported that meticulously debrided the fibrinous exudate and scar tissue from the patella extensor mechanism the knee was well-balanced flexion-extension gaps patella tracked anatomically after the poly exchange.  I then meticulously closed the arthrotomy with 1 layer Vicryl then a running locking suture tourniquet was deflated normal normal circulation in the foot and ankle.  Compressive wrap was applied to the knee skin looks satisfactory and then closed with staples drain hooked to suction sterile dressing immobilizer.  Patient positioning prepping draping technical surgical assistance throughout entire case wound closure application dressing is suture management Ms. Leanne haus PA-C assistance was needed.

## 2022-11-15 NOTE — Interval H&P Note (Signed)
History and Physical Interval Note:  11/15/2022 4:29 PM  [name] [name]  has presented today for surgery, with the diagnosis of Right wound infection.  The various methods of treatment have been discussed with the patient and family. After consideration of risks, benefits and other options for treatment, the patient has consented to  Procedure(s) with comments: IRRIGATION AND DEBRIDEMENT KNEE WITH POSSIBLE  POLY EXCHANGE (Right) - adductor canal, no antibiotics prior to culture add on room to follow other case 60 as a surgical intervention.  The patient's history has been reviewed, patient examined, no change in status, stable for surgery.  I have reviewed the patient's chart and labs.  Questions were answered to the patient's satisfaction.     [name] ANDREW

## 2022-11-15 NOTE — Anesthesia Preprocedure Evaluation (Addendum)
Anesthesia Evaluation  Patient identified by MRN, date of birth, ID band Patient awake    Reviewed: Allergy & Precautions, NPO status , Patient's Chart, lab work & pertinent test results  Airway Mallampati: II  TM Distance: >3 FB Neck ROM: Full    Dental no notable dental hx.    Pulmonary neg pulmonary ROS   Pulmonary exam normal        Cardiovascular hypertension, Pt. on medications Normal cardiovascular exam     Neuro/Psych  PSYCHIATRIC DISORDERS  Depression    negative neurological ROS     GI/Hepatic negative GI ROS, Neg liver ROS,,,  Endo/Other  negative endocrine ROS    Renal/GU Renal InsufficiencyRenal disease     Musculoskeletal  (+) Arthritis ,    Abdominal  (+) + obese  Peds  Hematology  (+) Blood dyscrasia, anemia   Anesthesia Other Findings Right wound infection  Reproductive/Obstetrics                              Anesthesia Physical Anesthesia Plan  ASA: 3  Anesthesia Plan: General and Regional   Post-op Pain Management: Regional block*   Induction: Intravenous  PONV Risk Score and Plan: 2 and Ondansetron, Dexamethasone and Treatment may vary due to age or medical condition  Airway Management Planned: LMA  Additional Equipment:   Intra-op Plan:   Post-operative Plan: Extubation in OR  Informed Consent: I have reviewed the patients History and Physical, chart, labs and discussed the procedure including the risks, benefits and alternatives for the proposed anesthesia with the patient or authorized representative who has indicated his/her understanding and acceptance.     Dental advisory given  Plan Discussed with: CRNA  Anesthesia Plan Comments:          Anesthesia Quick Evaluation

## 2022-11-15 NOTE — Progress Notes (Signed)
Pharmacy Antibiotic Note  [name] [name] is a 72 y.o. male admitted on 11/14/2022 with infected R TKA s/p I&D.  Pharmacy has been consulted for cefazolin and vancomycin dosing.  Plan: Cefazolin 2g IV q8h Vancomycin 1749m IV q48h for estimated AUC 497 using SCr 1.94, Vd 0.5 Check vancomycin levels at steady state, goal AUC 400-550 Follow up renal function & cultures  Height: 5' 9"$  (175.3 cm) Weight: 104.3 kg (230 lb) IBW/kg (Calculated) : 70.7  Temp (24hrs), Avg:98.4 F (36.9 C), Min:97.5 F (36.4 C), Max:99 F (37.2 C)  Recent Labs  Lab 11/14/22 1746  WBC 9.5  CREATININE 1.94*    Estimated Creatinine Clearance: 42.1 mL/min (A) (by C-G formula based on SCr of 1.94 mg/dL (H)).    Allergies  Allergen Reactions   Escitalopram Oxalate Other (See Comments)    Anxiety, insomnia   Irbesartan Cough   Penicillins Rash    Blotchy spots    Antimicrobials this admission: 2/13 Cefazolin >> 2/13 Vancomycin >>  Dose adjustments this admission:  Microbiology results: 2/13 knee fluid:  Thank you for allowing pharmacy to be a part of this patient's care.  EPeggyann Juba PharmD, BCPS Pharmacy: [phone]/13/2024 7:03 PM

## 2022-11-15 NOTE — Transfer of Care (Signed)
Immediate Anesthesia Transfer of Care Note  Patient: [name] [name]  Procedure(s) Performed: Procedure(s) with comments: IRRIGATION AND DEBRIDEMENT KNEE WITH  POLY EXCHANGE (Right) - adductor canal, no antibiotics prior to culture add on room to follow other case 60  Patient Location: PACU  Anesthesia Type:General  Level of Consciousness: Alert, Awake, Oriented  Airway & Oxygen Therapy: Patient Spontanous Breathing  Post-op Assessment: Report given to RN  Post vital signs: Reviewed and stable  Last Vitals:  Vitals:   11/15/22 1616 11/15/22 1856  BP:  (!) 153/91  Pulse: 86 (!) 109  Resp: 14 17  Temp:  36.7 C  SpO2: [id number]    Complications: No apparent anesthesia complications

## 2022-11-15 NOTE — H&P (Signed)
[name] [name] is an 72 y.o. male.   Chief Complaint: Right knee pain HPI: Pleasant 72 year old male who underwent a total knee arthroplasty with Dr.Collins on 10/25/2022. He presented to the office on 11/14/2022 due to drainage from incision site and severe pain in the knee. He states that he was doing great until the Thursday prior after PT. He states that pain increased and then he started noticing draining. His pain was increasing and by Friday was unable to bear weight on the leg. He reports chills over the weekend and a low grade fever.   Past Medical History:  Diagnosis Date   Bronchitis    Depression    GERD (gastroesophageal reflux disease)    Hypertension     Past Surgical History:  Procedure Laterality Date   CHOLECYSTECTOMY N/A 02/10/2021   Procedure: LAPAROSCOPIC CHOLECYSTECTOMY;  Surgeon: Erroll Luna, MD;  Location: [facility];  Service: General;  Laterality: N/A;   HERNIA REPAIR     Knee Surgery Right 10/25/2022   ROTATOR CUFF REPAIR      Family History  Problem Relation Age of Onset   Emphysema Mother        heart failure indusce emphysema   Heart disease Father    Heart attack Father    Heart disease Sister 90       cabgx3   Social History:  reports that he has never smoked. He has never used smokeless tobacco. He reports current alcohol use. He reports that he does not use drugs.  Allergies:  Allergies  Allergen Reactions   Escitalopram Oxalate Other (See Comments)    Anxiety, insomnia   Irbesartan Cough   Penicillins Rash    Blotchy spots    Medications Prior to Admission  Medication Sig Dispense Refill   acetaminophen (TYLENOL) 500 MG tablet Take 1,000 mg by mouth 2 (two) times daily.     APPLE CIDER VINEGAR PO Take 1 capsule by mouth daily.     aspirin EC 81 MG tablet Take 81 mg by mouth daily. Swallow whole.     calcium carbonate (TUMS - DOSED IN MG ELEMENTAL CALCIUM) 500 MG chewable tablet Chew 2 tablets by mouth 2 (two) times daily as needed for  indigestion or heartburn.     Ergocalciferol (VITAMIN D2) 50 MCG (2000 UT) TABS Take 2,000 Units by mouth daily.     Ketotifen Fumarate (ALLERGY EYE DROPS OP) Place 1 drop into both eyes daily as needed (red/itchy eyes).     Magnesium 500 MG TABS Take 500 mg by mouth daily.     Multiple Vitamin (MULTI-VITAMIN PO) Take 1 tablet by mouth daily.     naproxen (NAPROSYN) 500 MG tablet Take 500 mg by mouth 2 (two) times daily.     oxyCODONE (OXY IR/ROXICODONE) 5 MG immediate release tablet Take 5-10 mg by mouth every 4 (four) hours as needed for severe pain.     sertraline (ZOLOFT) 100 MG tablet TAKE 1 TABLET BY MOUTH EVERY DAY 90 tablet 3   sodium chloride (OCEAN) 0.65 % SOLN nasal spray Place 1 spray into both nostrils as needed for congestion.     tamsulosin (FLOMAX) 0.4 MG CAPS capsule Take 0.4 mg by mouth daily.  0   telmisartan (MICARDIS) 80 MG tablet TAKE 1 TABLET BY MOUTH EVERY DAY 90 tablet 3   traMADol (ULTRAM) 50 MG tablet Take 50 mg by mouth every 6 (six) hours as needed for moderate pain.     Zinc 30 MG TABS  Take 30 mg by mouth daily.      Results for orders placed or performed during the hospital encounter of 11/14/22 (from the past 48 hour(s))  CBC     Status: Abnormal   Collection Time: 11/14/22  5:46 PM  Result Value Ref Range   WBC 9.5 4.0 - 10.5 K/uL   RBC 3.93 (L) 4.22 - 5.81 MIL/uL   Hemoglobin 12.4 (L) 13.0 - 17.0 g/dL   HCT 37.2 (L) 39.0 - 52.0 %   MCV 94.7 80.0 - 100.0 fL   MCH 31.6 26.0 - 34.0 pg   MCHC 33.3 30.0 - 36.0 g/dL   RDW 13.6 11.5 - 15.5 %   Platelets 214 150 - 400 K/uL   nRBC 0.0 0.0 - 0.2 %    Comment: Performed at [facility], [place] [address], [place], [place] [id number]  Basic metabolic panel     Status: Abnormal   Collection Time: 11/14/22  5:46 PM  Result Value Ref Range   Sodium 130 (L) 135 - 145 mmol/L   Potassium 4.2 3.5 - 5.1 mmol/L   Chloride 93 (L) 98 - 111 mmol/L   CO2 24 22 - 32 mmol/L   Glucose, Bld 128 (H) 70 - 99  mg/dL    Comment: Glucose reference range applies only to samples taken after fasting for at least 8 hours.   BUN 36 (H) 8 - 23 mg/dL   Creatinine, Ser 1.94 (H) 0.61 - 1.24 mg/dL   Calcium 9.6 8.9 - 10.3 mg/dL   GFR, Estimated 37 (L) >60 mL/min    Comment: (NOTE) Calculated using the CKD-EPI Creatinine Equation (2021)    Anion gap 13 5 - 15    Comment: Performed at [facility], [place] [address], [place], [place] [address]  Sedimentation rate     Status: Abnormal   Collection Time: 11/14/22  5:46 PM  Result Value Ref Range   Sed Rate 97 (H) 0 - 16 mm/hr    Comment: Performed at [facility], [place] [address] [place], [place] [address]  C-reactive protein     Status: Abnormal   Collection Time: 11/14/22  5:46 PM  Result Value Ref Range   CRP 39.8 (H) <1.0 mg/dL    Comment: Performed at [place] [address] [place], [place] [address]  HIV Antibody (routine testing w rflx)     Status: None   Collection Time: 11/14/22  5:46 PM  Result Value Ref Range   HIV Screen 4th Generation wRfx Non Reactive Non Reactive    Comment: Performed at [facility], [place] [address], [place],  [address]   No results found.  Review of Systems  Constitutional:  Positive for chills, fatigue and fever (low grade 99-100).  Musculoskeletal:  Positive for joint swelling and myalgias (severe right knee pain).  Skin:  Positive for wound (right knee joint).    Blood pressure 104/82, pulse 90, temperature 99 F (37.2 C), temperature source Oral, resp. rate 17, height 5' 9"$  (1.753 m), weight 104.3 kg, SpO2 96 %. Physical Exam Vitals reviewed.  Constitutional:      Appearance: Normal appearance. He is ill-appearing and diaphoretic.  HENT:     Head: Normocephalic and atraumatic.  Musculoskeletal:        General: Swelling (right knee) present.     Comments: Draining from incision site of right knee.  Redness and warmth Severe pain with ROM and any  palpation  Skin:    Capillary Refill: Capillary refill takes less than 2 seconds.     Findings: Erythema (right knee) present.  Neurological:     General: No focal deficit present.     Mental Status: He is alert and oriented to person, place, and time.  Psychiatric:        Mood and Affect: Mood normal.        Behavior: Behavior normal.        Thought Content: Thought content normal.        Judgment: Judgment normal.      Assessment/Plan Right knee infection s/p TKA Hopefully infection is more superficial than deep at this time. He has been admitted to hospital. He is scheduled for a right knee I and D and possible poly exchange. Cultures will be taken while in the OR so hold abx until this is done. A postop game plan will be formed after the OR. Risks and benefits discussed with the patient in the office.    Drue Novel, PA [facility] with Dr. Theda Sers [phone] 11/15/2022, 7:43 AM

## 2022-11-15 NOTE — Anesthesia Procedure Notes (Signed)
Procedure Name: LMA Insertion Date/Time: 11/15/2022 4:38 PM  Performed by: Montel Clock, CRNAPre-anesthesia Checklist: Patient identified, Emergency Drugs available, Suction available, Patient being monitored and Timeout performed Patient Re-evaluated:Patient Re-evaluated prior to induction Oxygen Delivery Method: Circle system utilized Preoxygenation: Pre-oxygenation with 100% oxygen Induction Type: IV induction Ventilation: Mask ventilation without difficulty LMA: LMA with gastric port inserted LMA Size: 4.0 Number of attempts: 1 Dental Injury: Teeth and Oropharynx as per pre-operative assessment

## 2022-11-15 NOTE — Anesthesia Procedure Notes (Signed)
Anesthesia Regional Block: Adductor canal block   Pre-Anesthetic Checklist: , timeout performed,  Correct Patient, Correct Site, Correct Laterality,  Correct Procedure,, site marked,  Risks and benefits discussed,  Surgical consent,  Pre-op evaluation,  At surgeon's request and post-op pain management  Laterality: Right  Prep: chloraprep       Needles:  Injection technique: Single-shot  Needle Type: Echogenic Stimulator Needle     Needle Length: 10cm  Needle Gauge: 20     Additional Needles:   Procedures:,,,, ultrasound used (permanent image in chart),,    Narrative:  Start time: 11/15/2022 4:10 PM End time: 11/15/2022 4:20 PM Injection made incrementally with aspirations every 5 mL.  Performed by: Personally  Anesthesiologist: Murvin Natal, MD  Additional Notes: Functioning IV was confirmed and monitors were applied. A time-out was performed. Hand hygiene and sterile gloves were used. The thigh was placed in a frog-leg position and prepped in a sterile fashion. A 186m 20ga Bbraun echogenic stimulator needle was placed using ultrasound guidance.  Negative aspiration and negative test dose prior to incremental administration of local anesthetic. The patient tolerated the procedure well.

## 2022-11-16 ENCOUNTER — Other Ambulatory Visit: Payer: Self-pay

## 2022-11-16 ENCOUNTER — Inpatient Hospital Stay: Payer: Self-pay

## 2022-11-16 DIAGNOSIS — M00861 Arthritis due to other bacteria, right knee: Secondary | ICD-10-CM

## 2022-11-16 DIAGNOSIS — T8453XA Infection and inflammatory reaction due to internal right knee prosthesis, initial encounter: Secondary | ICD-10-CM | POA: Diagnosis not present

## 2022-11-16 DIAGNOSIS — Z880 Allergy status to penicillin: Secondary | ICD-10-CM

## 2022-11-16 LAB — BASIC METABOLIC PANEL
Anion gap: 14 (ref 5–15)
BUN: 32 mg/dL — ABNORMAL HIGH (ref 8–23)
CO2: 23 mmol/L (ref 22–32)
Calcium: 9 mg/dL (ref 8.9–10.3)
Chloride: 95 mmol/L — ABNORMAL LOW (ref 98–111)
Creatinine, Ser: 1.2 mg/dL (ref 0.61–1.24)
GFR, Estimated: >60 mL/min (ref >60)
Glucose, Bld: 151 mg/dL — ABNORMAL HIGH (ref 70–99)
Potassium: 4.9 mmol/L (ref 3.5–5.1)
Sodium: 132 mmol/L — ABNORMAL LOW (ref 135–145)

## 2022-11-16 LAB — HEMOGLOBIN AND HEMATOCRIT, BLOOD
HCT: 35.6 % — ABNORMAL LOW (ref 39.0–52.0)
Hemoglobin: 11.6 g/dL — ABNORMAL LOW (ref 13.0–17.0)

## 2022-11-16 MED ORDER — AMOXICILLIN 500 MG PO CAPS
500.0000 mg | ORAL_CAPSULE | Freq: Once | ORAL | Status: AC
  Administered 2022-11-16: 500 mg via ORAL
  Filled 2022-11-16: qty 1

## 2022-11-16 MED ORDER — POLYETHYLENE GLYCOL 3350 17 G PO PACK
17.0000 g | PACK | Freq: Every day | ORAL | Status: DC
  Administered 2022-11-16 – 2022-11-19 (×4): 17 g via ORAL
  Filled 2022-11-16 (×4): qty 1

## 2022-11-16 MED ORDER — DIPHENHYDRAMINE HCL 50 MG/ML IJ SOLN: 25.0000 mg | Freq: Once | INTRAMUSCULAR | Status: DC | PRN

## 2022-11-16 MED ORDER — EPINEPHRINE 0.3 MG/0.3ML IJ SOAJ
0.3000 mg | Freq: Once | INTRAMUSCULAR | Status: DC | PRN
  Filled 2022-11-16: qty 0.3

## 2022-11-16 MED ORDER — DOCUSATE SODIUM 100 MG PO CAPS: 200.0000 mg | ORAL_CAPSULE | Freq: Two times a day (BID) | ORAL | Status: DC | PRN

## 2022-11-16 MED ORDER — SODIUM CHLORIDE 0.9 % IV SOLN
8.0000 mg/kg | Freq: Every day | INTRAVENOUS | Status: DC
  Administered 2022-11-16 – 2022-11-17 (×2): 650 mg via INTRAVENOUS
  Filled 2022-11-16 (×2): qty 13

## 2022-11-16 NOTE — Plan of Care (Signed)

## 2022-11-16 NOTE — Consult Note (Signed)
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 PCN allergy GERD HTN  Plan:  Change vancomycin to daptomycin with continue cefazolin Follow-up cultures from orthopedic surgery office as well as the operating room Amoxicillin po challenge PICC line placement Sed rate CRP Screen for HIV and viral hepatitides and low risk patient  Principal Problem:   Infection of right knee ([place])   Scheduled Meds:  aspirin  81 mg Oral BID   irbesartan  75 mg Oral Daily   sertraline  100 mg Oral Daily   tamsulosin  0.4 mg Oral Daily   traMADol  50 mg Oral Q6H   Continuous Infusions:   ceFAZolin (ANCEF) IV 2 g (11/16/22 0931)   methocarbamol (ROBAXIN) IV     PRN Meds:.acetaminophen, diphenhydrAMINE, HYDROmorphone (DILAUDID) injection, HYDROmorphone, methocarbamol **OR** methocarbamol (ROBAXIN) IV, oxyCODONE, sodium chloride, zolpidem  HPI: [name] [name] is a 72 y.o. male who has history of hypertension GERD depression he underwent total knee arthroplasty with Dr. Theda Sers on 10/25/2022.  He appeared to be doing relatively well postoperatively but then had drainage that worsened in particular when his knee was flexed during physical therapy.  He had of note been on postop for of cephalexin but then run out of this.  He then was seen in the office on fibber the 12th with worsening drainage at the incision site.  Pain is also worsened significantly.  Developed fevers chills fatigue and myalgias.  The knee was aspirated and sent for count which showed 63000 white blood cells culture with GS apparently shows gram-positive cocci though the actual culture results are not yet back to just the Gram stain.  He was taken the operating room yesterday or went I&D with poly exchange.  Intraoperative cultures  here are also showing gram-positive cocci on Gram stain.  He has been on vancomycin and cefazolin.  I will switch his vancomycin for daptomycin.  He has a history of penicillin allergy as a child where he had a rash.  I think he would benefit from a amoxicillin challenge to see if he can tolerate penicillins now and if this allergy is no longer an issue.   I spent 84 minutes with the patient including than 50% of the time in face to face counseling of the patient re his  prosthetic joint infection along with review of medical records in preparation for the visit and during the visit and in coordination of nis care.    Review of Systems: Review of Systems  Constitutional:  Negative for chills, fever, malaise/fatigue and weight loss.  HENT:  Negative for congestion and sore throat.   Eyes:  Negative for blurred vision and photophobia.  Respiratory:  Negative for cough, shortness of breath and wheezing.   Cardiovascular:  Negative for chest pain, palpitations and leg swelling.  Gastrointestinal:  Negative for abdominal pain, blood in stool, constipation, diarrhea, heartburn, melena, nausea and vomiting.  Genitourinary:  Negative for dysuria, flank pain and hematuria.  Musculoskeletal:  Positive for joint pain and myalgias. Negative for back pain and falls.  Skin:  Negative for itching and rash.  Neurological:  Negative for dizziness, focal weakness, loss of consciousness, weakness and headaches.  Endo/Heme/Allergies:  Does not bruise/bleed easily.  Psychiatric/Behavioral:  Negative for depression and  suicidal ideas. The patient does not have insomnia.     Past Medical History:  Diagnosis Date   Bronchitis    Depression    GERD (gastroesophageal reflux disease)    Hypertension     Social History   Tobacco Use   Smoking status: Never   Smokeless tobacco: Never  Vaping Use   Vaping Use: Never used  Substance Use Topics   Alcohol use: Yes    Comment: socially   Drug use: No     Family History  Problem Relation Age of Onset   Emphysema Mother        heart failure indusce emphysema   Heart disease Father    Heart attack Father    Heart disease Sister 27       cabgx3   Allergies  Allergen Reactions   Escitalopram Oxalate Other (See Comments)    Anxiety, insomnia   Irbesartan Cough   Penicillins Rash    Blotchy spots    OBJECTIVE: Blood pressure (!) 158/77, pulse 99, temperature 98.6 F (37 C), temperature source Oral, resp. rate 16, height 5' 9"$  (1.753 m), weight 104.3 kg, SpO2 97 %.  Physical Exam Constitutional:      Appearance: He is well-developed.  HENT:     Head: Normocephalic and atraumatic.  Eyes:     Conjunctiva/sclera: Conjunctivae normal.  Cardiovascular:     Rate and Rhythm: Normal rate and regular rhythm.  Pulmonary:     Effort: Pulmonary effort is normal. No respiratory distress.     Breath sounds: No wheezing.  Abdominal:     General: There is no distension.     Palpations: Abdomen is soft.  Musculoskeletal:     Cervical back: Normal range of motion and neck supple.  Skin:    General: Skin is warm and dry.     Coloration: Skin is not pale.     Findings: No erythema or rash.  Neurological:     General: No focal deficit present.     Mental Status: He is alert and oriented to person, place, and time.  Psychiatric:        Mood and Affect: Mood normal.        Behavior: Behavior normal.        Thought Content: Thought content normal.        Judgment: Judgment normal.    Knee bandaged  Lab Results Lab Results  Component Value Date   WBC 9.5 11/14/2022   HGB 11.6 (L) 11/16/2022   HCT 35.6 (L) 11/16/2022   MCV 94.7 11/14/2022   PLT 214 11/14/2022    Lab Results  Component Value Date   CREATININE 1.20 11/16/2022   BUN 32 (H) 11/16/2022   NA 132 (L) 11/16/2022   K 4.9 11/16/2022   CL 95 (L) 11/16/2022   CO2 23 11/16/2022    Lab Results  Component Value Date   ALT 22 07/07/2022   AST 17 07/07/2022   ALKPHOS  50 07/07/2022   BILITOT 0.8 07/07/2022     Microbiology: Recent Results (from the past 240 hour(s))  Aerobic/Anaerobic Culture w Gram Stain (surgical/deep wound)     Status: None (Preliminary result)   Collection Time: 11/15/22  5:34 PM   Specimen: Joint, Other; Body Fluid  Result Value Ref Range Status   Specimen Description   Final    FLUID R KNEE JOINT Performed at [facility] [address], [place], [place] [address]    Special Requests   Final  NONE Performed at [facility], [place] [address], [place], [place] [address]    Gram Stain   Final    RARE WBC PRESENT, PREDOMINANTLY PMN MODERATE GRAM POSITIVE COCCI    Culture   Final    TOO YOUNG TO READ Performed at [facility], [place] [address], [place], [place] [address]    Report Status PENDING  Incomplete    Alcide Evener, [facility] [phone] pager  11/16/2022, 10:21 AM

## 2022-11-16 NOTE — Progress Notes (Addendum)
Pharmacy Antibiotic Note  [name] [name] is a 72 y.o. male admitted on 11/14/2022 with infected R TKA s/p I&D. Cultures from procedure pending. Pharmacy has been consulted for cefazolin and vancomycin dosing.  ID following - change vancomycin to daptomycin.  Plan: -Continue cefazolin 2g IV q8h -Stop vancomycin -Start daptomycin ~ 8 mg/kg -Continue to follow renal function, cultures and clinical progress for dose adjustments and de-escalation as indicated  Height: 5' 9"$  (175.3 cm) Weight: 104.3 kg (230 lb) IBW/kg (Calculated) : 70.7  Temp (24hrs), Avg:98.3 F (36.8 C), Min:97.5 F (36.4 C), Max:98.8 F (37.1 C)  Recent Labs  Lab 11/14/22 1746 11/16/22 0354  WBC 9.5  --   CREATININE 1.94* 1.20     Estimated Creatinine Clearance: 68.1 mL/min (by C-G formula based on SCr of 1.2 mg/dL).    Allergies  Allergen Reactions   Escitalopram Oxalate Other (See Comments)    Anxiety, insomnia   Irbesartan Cough   Penicillins Rash    Blotchy spots    Antimicrobials this admission: 2/14 Daptomycin >> 2/13 Cefazolin >> 2/13 Vancomycin >> 2/14  Microbiology results: 2/13 knee fluid: pending  Thank you for allowing pharmacy to be a part of this patient's care.  Tawnya Crook, PharmD, BCPS Clinical Pharmacist 11/16/2022 8:30 AM

## 2022-11-16 NOTE — Progress Notes (Signed)
Pharmacy Note- Penicillin Allergy Clarification   ASSESSMENT:   PEN-FAST Scoring   Five years or less since last reaction 0  Anaphylaxis/Angioedema OR Severe cutaneous adverse reaction  0  Treatment required for reaction  0  Total Score 0 points - Very low risk of positive penicillin allergy test (<1%)      Type of intervention (select all that apply):  Amoxicillin Oral Challenge  Impact on therapy (select all that apply):  Penicillin allergy removed   PLAN: - amoxicillin 500 mg PO x1  - Q66mn vitals monitoring x 1 hour  - Epi-Pen PRN  - IV diphenhydramine PRN  - plan communicated to primary RN   POST-CHALLENGE FOLLOW-UP:   Patient tolerated amoxicillin oral challenge without any reaction.  Penicillin allergy removed from EMR.  Told patient and spouse to inform pharmacy, dentist, and medical team that he is no longer allergic to penicillin.  JHeide Guile PharmD, BEl CastilloClinical Pharmacist Phone [phone]

## 2022-11-16 NOTE — Anesthesia Postprocedure Evaluation (Signed)
Anesthesia Post Note  Patient: [name] [name]  Procedure(s) Performed: IRRIGATION AND DEBRIDEMENT KNEE WITH  POLY EXCHANGE (Right: Knee)     Patient location during evaluation: PACU Anesthesia Type: Regional and General Level of consciousness: awake Pain management: pain level controlled Vital Signs Assessment: post-procedure vital signs reviewed and stable Respiratory status: spontaneous breathing, nonlabored ventilation and respiratory function stable Cardiovascular status: blood pressure returned to baseline and stable Postop Assessment: no apparent nausea or vomiting Anesthetic complications: no   No notable events documented.  Last Vitals:  Vitals:   11/16/22 1257 11/16/22 1327  BP: (!) 150/98 (!) 157/81  Pulse: (!) 102 87  Resp: 16 16  Temp: 36.4 C   SpO2: 95% 98%    Last Pain:  Vitals:   11/16/22 1946  TempSrc:   PainSc: 0-No pain                 Elvira Langston P Victoria Euceda

## 2022-11-16 NOTE — Progress Notes (Signed)
Arrived at patient bedside to obtain consent for PICC and place.  Patient states that he thought he would be getting PICC placed tomorrow and does not wish to have placed tonight.  Attempted to go ahead and explain PICC (procedure, risk, benefits), patient declines to have this explained tonight.  States that he "will not be going home tomorrow and that the bacteria has not even been identified yet".  PICC team will follow up 2/15 and place if patient is agreeable at that time.

## 2022-11-16 NOTE — TOC Initial Note (Signed)
Transition of Care [facility]) - Initial/Assessment Note   Patient Details  Name: [name] [name] MRN: [id number] Date of Birth: 05-30-1951  Transition of Care [facility]) CM/SW Contact:    Sherie Don, LCSW Phone Number: 11/16/2022, 3:23 PM  Clinical Narrative: Per RN and patient, patient is expected to discharge home on several weeks of IV antibiotics. Patient and spouse are agreeable to referrals to [place] for IV antibiotics and [place] agencies for nursing. CSW made referral to [facility] with [facility], which was accepted. CSW made [facility] referral to [facility], but there is no nursing available in patient's zip code. CSW made [facility] referral to [place] with [facility], which was accepted. TOC to follow.  Expected Discharge Plan: [place] Barriers to Discharge: Continued Medical Work up  Patient Goals and CMS Choice Patient states their goals for this hospitalization and ongoing recovery are:: Discharge home with IV antibiotics CMS Medicare.gov Compare Post Acute Care list provided to:: Patient Choice offered to / list presented to : Patient  Expected Discharge Plan and Services In-house Referral: Clinical Social Work Post Acute Care Choice: [place] arrangements for the past 2 months: Single Family Home           DME Arranged: N/A DME Agency: NA HH Arranged: RN, IV Antibiotics HH Agency: [facility], [facility] Date [place]: 11/16/22 Representative spoke with at [place]: Osborne Casco)  Prior Living Arrangements/Services Living arrangements for the past 2 months: Single Family Home Lives with:: Spouse Patient language and need for interpreter reviewed:: Yes Do you feel safe going back to the place where you live?: Yes      Need for Family Participation in Patient Care: No (Comment) Care giver support system in place?: Yes (comment) Criminal Activity/Legal Involvement Pertinent to Current Situation/Hospitalization: No - Comment as needed  Activities of  Daily Living Home Assistive Devices/Equipment: Walker (specify type) (rolling) ADL Screening (condition at time of admission) Patient's cognitive ability adequate to safely complete daily activities?: Yes Is the patient deaf or have difficulty hearing?: No Does the patient have difficulty seeing, even when wearing glasses/contacts?: No Does the patient have difficulty concentrating, remembering, or making decisions?: No Patient able to express need for assistance with ADLs?: Yes Does the patient have difficulty dressing or bathing?: No Independently performs ADLs?: Yes (appropriate for developmental age) Does the patient have difficulty walking or climbing stairs?: No Weakness of Legs: Right Weakness of Arms/Hands: None  Permission Sought/Granted Permission sought to share information with : Other (comment) Permission granted to share information with : Yes, Verbal Permission Granted Permission granted to share info w AGENCY: [facility], [place] agencies  Emotional Assessment Appearance:: Appears stated age Attitude/Demeanor/Rapport: Engaged Affect (typically observed): Accepting Orientation: : Oriented to Self, Oriented to Place, Oriented to  Time, Oriented to Situation Alcohol / Substance Use: Not Applicable  Admission diagnosis:  Infection of right knee [facility]) [M00.9] Patient Active Problem List   Diagnosis Date Noted   Infection of right knee ([place]) 11/14/2022   Chronic sinusitis 09/02/2022   Arthritis of right knee 09/02/2022   Lower respiratory infection 08/08/2022   Acute cough 02/01/2022   Encounter for general adult medical examination with abnormal findings 07/09/2021   BPH (benign prostatic hyperplasia) 07/09/2021   MDD (major depressive disorder), recurrent, in full remission ([place]) 07/09/2021   Essential hypertension 10/20/2008   PCP:  Hoyt Koch, MD Pharmacy:   [id number] [address] [place] [address] Phone: [phone] Fax: [phone]  Social Determinants of Health (SDOH) Social History: [place]: No Food Insecurity (11/14/2022)  Housing: Low Risk  (11/14/2022)  Transportation Needs: No Transportation Needs (11/14/2022)  Utilities: Not At Risk (11/14/2022)  Depression (PHQ2-9): Low Risk  (07/14/2022)  Tobacco Use: Low Risk  (11/15/2022)   SDOH Interventions:    Readmission Risk Interventions     No data to display

## 2022-11-16 NOTE — Plan of Care (Signed)
  Problem: Education: Goal: Knowledge of General Education information will improve Description: Including pain rating scale, medication(s)/side effects and non-pharmacologic comfort measures Outcome: Progressing   Problem: Activity: Goal: Risk for activity intolerance will decrease Outcome: Progressing   Problem: Pain Managment: Goal: General experience of comfort will improve Outcome: Progressing   

## 2022-11-16 NOTE — Progress Notes (Signed)
As per ID order, did amoxicillin challenge. After 1 hour, vss and patient reported no issues/reactions.

## 2022-11-16 NOTE — Progress Notes (Signed)
Subjective: 1 Day Post-Op Procedure(s) (LRB): IRRIGATION AND DEBRIDEMENT KNEE WITH  POLY EXCHANGE (Right) Patient reports pain as 8 on 0-10 scale.   He reports he was able to get some sleep last night, pain improving. Still is confused on what day of the week it is and how long he has been here Reports he was having some hallucinations last night  Objective: Vital signs in last 24 hours: Temp:  [97.5 F (36.4 C)-98.8 F (37.1 C)] 98.1 F (36.7 C) (02/14 0549) Pulse Rate:  [83-122] 102 (02/14 0549) Resp:  [14-23] 16 (02/14 0549) BP: (108-180)/(61-108) 155/89 (02/14 0549) SpO2:  [92 %-100 %] 100 % (02/14 0549)  Intake/Output from previous day: 02/13 0701 - 02/14 0700 In: 2590 [P.O.:240; I.V.:1800; IV Piggyback:550] Out: [id number] [Urine:1600; Drains:70; Blood:100] Intake/Output this shift: No intake/output data recorded.  Recent Labs    11/14/22 1746 11/16/22 0354  HGB 12.4* 11.6*   Recent Labs    11/14/22 1746 11/16/22 0354  WBC 9.5  --   RBC 3.93*  --   HCT 37.2* 35.6*  PLT 214  --    Recent Labs    11/14/22 1746 11/16/22 0354  NA 130* 132*  K 4.2 4.9  CL 93* 95*  CO2 24 23  BUN 36* 32*  CREATININE 1.94* 1.20  GLUCOSE 128* 151*  CALCIUM 9.6 9.0   No results for input(s): "LABPT", "INR" in the last 72 hours.  Neurovascular intact Sensation intact distally Intact pulses distally Dorsiflexion/Plantar flexion intact Compartment soft  Labs from office: synovial fluid- few gram + cocci in pairs found   Assessment/Plan: 1 Day Post-Op Procedure(s) (LRB): IRRIGATION AND DEBRIDEMENT KNEE WITH  POLY EXCHANGE (Right) Advance diet as tolerated Will contact ID for consult for abx management Continue on current pain medicine management Intraoperative Cultures pending    Anticipated LOS equal to or greater than 2 midnights due to - Age 26 and older with one or more of the following:  - Obesity  - Expected need for hospital services (PT, OT, Nursing) required  for safe  discharge  - Anticipated need for postoperative skilled nursing care or inpatient rehab  - Active co-morbidities: Postop infection OR   - Unanticipated findings during/Post Surgery: Infected knee joint or operative site  - Patient is a high risk of re-admission due to: None   Drue Novel, PA-C [facility] with Dr. Theda Sers [phone] 11/16/2022, 8:00 AM

## 2022-11-17 ENCOUNTER — Encounter (HOSPITAL_COMMUNITY): Payer: Self-pay | Admitting: Specialist

## 2022-11-17 DIAGNOSIS — A4102 Sepsis due to Methicillin resistant Staphylococcus aureus: Secondary | ICD-10-CM

## 2022-11-17 DIAGNOSIS — Z880 Allergy status to penicillin: Secondary | ICD-10-CM | POA: Diagnosis not present

## 2022-11-17 DIAGNOSIS — M00861 Arthritis due to other bacteria, right knee: Secondary | ICD-10-CM | POA: Diagnosis not present

## 2022-11-17 LAB — C-REACTIVE PROTEIN: CRP: 27.6 mg/dL — ABNORMAL HIGH (ref <1.0)

## 2022-11-17 LAB — BASIC METABOLIC PANEL
Anion gap: 12 (ref 5–15)
BUN: 20 mg/dL (ref 8–23)
CO2: 25 mmol/L (ref 22–32)
Calcium: 8.6 mg/dL — ABNORMAL LOW (ref 8.9–10.3)
Chloride: 95 mmol/L — ABNORMAL LOW (ref 98–111)
Creatinine, Ser: 0.88 mg/dL (ref 0.61–1.24)
GFR, Estimated: >60 mL/min (ref >60)
Glucose, Bld: 127 mg/dL — ABNORMAL HIGH (ref 70–99)
Potassium: 3.7 mmol/L (ref 3.5–5.1)
Sodium: 132 mmol/L — ABNORMAL LOW (ref 135–145)

## 2022-11-17 LAB — HEPATITIS B SURFACE ANTIGEN: Hepatitis B Surface Ag: NONREACTIVE

## 2022-11-17 LAB — HEMOGLOBIN AND HEMATOCRIT, BLOOD
HCT: 32.8 % — ABNORMAL LOW (ref 39.0–52.0)
Hemoglobin: 10.9 g/dL — ABNORMAL LOW (ref 13.0–17.0)

## 2022-11-17 LAB — SEDIMENTATION RATE: Sed Rate: 97 mm/hr — ABNORMAL HIGH (ref 0–16)

## 2022-11-17 LAB — HEPATITIS C ANTIBODY: HCV Ab: NONREACTIVE

## 2022-11-17 LAB — HEPATITIS A ANTIBODY, TOTAL: hep A Total Ab: NONREACTIVE

## 2022-11-17 LAB — CK: Total CK: 82 U/L (ref 49–397)

## 2022-11-17 MED ORDER — SODIUM CHLORIDE 0.9% FLUSH: 10.0000 mL | INTRAVENOUS | Status: DC | PRN

## 2022-11-17 MED ORDER — CHLORHEXIDINE GLUCONATE CLOTH 2 % EX PADS
6.0000 | MEDICATED_PAD | Freq: Every day | CUTANEOUS | Status: DC
  Administered 2022-11-17 – 2022-11-19 (×3): 6 via TOPICAL

## 2022-11-17 MED ORDER — SODIUM CHLORIDE 0.9% FLUSH
10.0000 mL | Freq: Two times a day (BID) | INTRAVENOUS | Status: DC
  Administered 2022-11-19: 10 mL

## 2022-11-17 NOTE — Progress Notes (Signed)
Subjective: 2 Days Post-Op Procedure(s) (LRB): IRRIGATION AND DEBRIDEMENT KNEE WITH  POLY EXCHANGE (Right) Patient reports pain as 7 on 0-10 scale.  It is improving Reports slept better last night compared to previous nights  Objective: Vital signs in last 24 hours: Temp:  [97.6 F (36.4 C)-98.9 F (37.2 C)] 98.9 F (37.2 C) (02/15 0516) Pulse Rate:  [81-102] 81 (02/15 0516) Resp:  [16-18] 18 (02/15 0516) BP: (140-158)/(74-98) 151/74 (02/15 0516) SpO2:  [94 %-98 %] 94 % (02/15 0516)  Intake/Output from previous day: 02/14 0701 - 02/15 0700 In: 819.8 [P.O.:720; IV Piggyback:99.8] Out: 2550 [Urine:2550] Intake/Output this shift: Total I/O In: -  Out: 650 [Urine:650]  Recent Labs    11/14/22 1746 11/16/22 0354  HGB 12.4* 11.6*   Recent Labs    11/14/22 1746 11/16/22 0354  WBC 9.5  --   RBC 3.93*  --   HCT 37.2* 35.6*  PLT 214  --    Recent Labs    11/14/22 1746 11/16/22 0354  NA 130* 132*  K 4.2 4.9  CL 93* 95*  CO2 24 23  BUN 36* 32*  CREATININE 1.94* 1.20  GLUCOSE 128* 151*  CALCIUM 9.6 9.0   No results for input(s): "LABPT", "INR" in the last 72 hours.  Neurologically intact Neurovascular intact Sensation intact distally Intact pulses distally Dorsiflexion/Plantar flexion intact Incision: dressing C/D/I Compartment soft Still some redness and warmth over right knee but improving   Assessment/Plan: 2 Days Post-Op Procedure(s) (LRB): IRRIGATION AND DEBRIDEMENT KNEE WITH  POLY EXCHANGE (Right) Advance diet Continue ABX therapy due to Post-op infection Drain is left in at this time, 1 more day with drain in and then we will remove tomorrow Still waiting for cultures to finish I did talk to patient today he will get PICC line placed today Most likely spend 1 more night in the hospital and then as long as everything is okay we can let him discharged tomorrow Overall he is slowly improving. I did order some new labs this morning just to continue  monitoring him.  Anticipated LOS equal to or greater than 2 midnights due to - Age 21 and older with one or more of the following:  - Obesity  - Expected need for hospital services (PT, OT, Nursing) required for safe  discharge  - Anticipated need for postoperative skilled nursing care or inpatient rehab  - Active co-morbidities: Postop infection OR   - Unanticipated findings during/Post Surgery: Infected knee joint or operative site  - Patient is a high risk of re-admission due to: None   Drue Novel, PA-C [facility] with Dr. Theda Sers [phone] 11/17/2022, 6:53 AM

## 2022-11-17 NOTE — Care Management Important Message (Signed)
Important Message  Patient Details IM Letter given. Name: [name] [name] MRN: [id number] Date of Birth: 1951-05-02   Medicare Important Message Given:  Yes     Kerin Salen 11/17/2022, 1:33 PM

## 2022-11-17 NOTE — Progress Notes (Signed)
Peripherally Inserted Central Catheter Placement  The IV Nurse has discussed with the patient and/or persons authorized to consent for the patient, the purpose of this procedure and the potential benefits and risks involved with this procedure.  The benefits include less needle sticks, lab draws from the catheter, and the patient may be discharged home with the catheter. Risks include, but not limited to, infection, bleeding, blood clot (thrombus formation), and puncture of an artery; nerve damage and irregular heartbeat and possibility to perform a PICC exchange if needed/ordered by physician.  Alternatives to this procedure were also discussed.  Bard Power PICC patient education guide, fact sheet on infection prevention and patient information card has been provided to patient /or left at bedside.    PICC Placement Documentation  PICC Single Lumen 11/17/22 Right Brachial 40 cm 0 cm (Active)  Indication for Insertion or Continuance of Line Home intravenous therapies (PICC only) 11/17/22 1827  Exposed Catheter (cm) 0 cm 11/17/22 1827  Site Assessment Clean, Dry, Intact 11/17/22 1827  Line Status Flushed;Blood return noted;Saline locked 11/17/22 1827  Dressing Type Transparent 11/17/22 1827  Dressing Status Antimicrobial disc in place 11/17/22 1827  Safety Lock Not Applicable [id number]  Line Care Connections checked and tightened 11/17/22 1827  Line Adjustment (NICU/IV Team Only) No 11/17/22 1827  Dressing Intervention New dressing 11/17/22 1827  Dressing Change Due 11/24/22 11/17/22 1827       Scotty Court 11/17/2022, 6:28 PM

## 2022-11-17 NOTE — Plan of Care (Signed)
  Problem: Education: Goal: Knowledge of General Education information will improve Description: Including pain rating scale, medication(s)/side effects and non-pharmacologic comfort measures Outcome: Progressing   Problem: Activity: Goal: Risk for activity intolerance will decrease Outcome: Progressing   Problem: Pain Managment: Goal: General experience of comfort will improve Outcome: Progressing   

## 2022-11-17 NOTE — Progress Notes (Signed)
Subjective: No new complaints   Antibiotics:  Anti-infectives (From admission, onward)    Start     Dose/Rate Route Frequency Ordered Stop   11/16/22 2000  DAPTOmycin (CUBICIN) 650 mg in sodium chloride 0.9 % IVPB        8 mg/kg  84.1 kg (Adjusted) 126 mL/hr over 30 Minutes Intravenous Daily 11/16/22 1122     11/16/22 1145  amoxicillin (AMOXIL) capsule 500 mg        500 mg Oral  Once 11/16/22 1052 11/16/22 1211   11/16/22 0200  ceFAZolin (ANCEF) IVPB 2g/100 mL premix        2 g 200 mL/hr over 30 Minutes Intravenous Every 8 hours 11/15/22 1907     11/15/22 2200  vancomycin (VANCOREADY) IVPB 1750 mg/350 mL  Status:  Discontinued        1,750 mg 175 mL/hr over 120 Minutes Intravenous Every 48 hours 11/15/22 1907 11/16/22 1020   11/15/22 1030  ceFAZolin (ANCEF) IVPB 2g/100 mL premix        2 g 200 mL/hr over 30 Minutes Intravenous On call to O.R. 11/15/22 1015 11/15/22 1745       Medications: Scheduled Meds:  aspirin  81 mg Oral BID   irbesartan  75 mg Oral Daily   polyethylene glycol  17 g Oral Daily   sertraline  100 mg Oral Daily   tamsulosin  0.4 mg Oral Daily   traMADol  50 mg Oral Q6H   Continuous Infusions:   ceFAZolin (ANCEF) IV 2 g (11/17/22 0834)   DAPTOmycin (CUBICIN) 650 mg in sodium chloride 0.9 % IVPB 650 mg (11/16/22 2021)   methocarbamol (ROBAXIN) IV     PRN Meds:.acetaminophen, diphenhydrAMINE, diphenhydrAMINE, docusate sodium, EPINEPHrine, HYDROmorphone (DILAUDID) injection, HYDROmorphone, methocarbamol **OR** methocarbamol (ROBAXIN) IV, oxyCODONE, sodium chloride, zolpidem    Objective: Weight change:   Intake/Output Summary (Last 24 hours) at 11/17/2022 1512 Last data filed at 11/17/2022 1502 Gross per 24 hour  Intake 820 ml  Output 2150 ml  Net -1330 ml   Blood pressure (!) 153/77, pulse 78, temperature 98.2 F (36.8 C), resp. rate 16, height 5' 9"$  (1.753 m), weight 104.3 kg, SpO2 96 %. Temp:  [98.2 F (36.8 C)-98.9 F (37.2 C)]  98.2 F (36.8 C) (02/15 1352) Pulse Rate:  [78-87] 78 (02/15 1352) Resp:  [16-18] 16 (02/15 1352) BP: (140-153)/(74-80) 153/77 (02/15 1352) SpO2:  [94 %-96 %] 96 % (02/15 1352)  Physical Exam: Physical Exam Constitutional:      Appearance: He is well-developed.  HENT:     Head: Normocephalic and atraumatic.  Eyes:     Conjunctiva/sclera: Conjunctivae normal.  Cardiovascular:     Rate and Rhythm: Normal rate and regular rhythm.  Pulmonary:     Effort: Pulmonary effort is normal. No respiratory distress.     Breath sounds: No wheezing.  Abdominal:     General: There is no distension.     Palpations: Abdomen is soft.  Musculoskeletal:     Cervical back: Normal range of motion and neck supple.  Skin:    General: Skin is warm and dry.     Findings: No erythema or rash.  Neurological:     General: No focal deficit present.     Mental Status: He is alert and oriented to person, place, and time.  Psychiatric:        Mood and Affect: Mood normal.        Behavior: Behavior normal.  Thought Content: Thought content normal.        Judgment: Judgment normal.     Knee dressed  CBC:    BMET Recent Labs    11/16/22 0354 11/17/22 0722  NA 132* 132*  K 4.9 3.7  CL 95* 95*  CO2 23 25  GLUCOSE 151* 127*  BUN 32* 20  CREATININE 1.20 0.88  CALCIUM 9.0 8.6*     Liver Panel  No results for input(s): "PROT", "ALBUMIN", "AST", "ALT", "ALKPHOS", "BILITOT", "BILIDIR", "IBILI" in the last 72 hours.     Sedimentation Rate Recent Labs    11/17/22 0339  ESRSEDRATE 97*   C-Reactive Protein Recent Labs    11/14/22 1746 11/17/22 0339  CRP 39.8* 27.6*    Micro Results: Recent Results (from the past 720 hour(s))  Aerobic/Anaerobic Culture w Gram Stain (surgical/deep wound)     Status: None (Preliminary result)   Collection Time: 11/15/22  5:34 PM   Specimen: Joint, Other; Body Fluid  Result Value Ref Range Status   Specimen Description   Final    FLUID R KNEE  JOINT Performed at [facility] [address], [place], [place] [address]    Special Requests   Final    NONE Performed at [facility], [place] [address] [place], [place] [address]    Gram Stain   Final    RARE WBC PRESENT, PREDOMINANTLY PMN MODERATE GRAM POSITIVE COCCI Performed at [facility], [place] [address] [place], [place] [address]    Culture   Final    ABUNDANT STAPHYLOCOCCUS AUREUS SUSCEPTIBILITIES TO FOLLOW NO ANAEROBES ISOLATED; CULTURE IN PROGRESS FOR 5 DAYS    Report Status PENDING  Incomplete    Studies/Results: [name] EKG SITE RITE  Result Date: 11/16/2022 If Site Rite image not attached, placement could not be confirmed due to current cardiac rhythm.     Assessment/Plan:  INTERVAL HISTORY: Cultures from the operating room are yielding Staphylococcus aureus with sensitivities pending   Principal Problem:   Infection of right knee ([place])    [name] [name] is a 72 y.o. 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  Hopefully will have susceptibility data tomorrow.  If it is MRSA we will go with daptomycin if it is MSSA we will go with cefazolin  To have 6 weeks of IV therapy followed by protracted oral therapy  #2 PCN allergy: had no problems with amoxicillin and if not done will remove from allergy list  I spent 12mnutes with the patient including than 50% of the time in face to face counseling of the patient re his PJI along with review of medical records in preparation for the visit and during the visit and in coordination of his care.    LOS: 3 days   CAlcide Evener2/15/2024, 3:12 PM

## 2022-11-18 DIAGNOSIS — M009 Pyogenic arthritis, unspecified: Secondary | ICD-10-CM | POA: Diagnosis not present

## 2022-11-18 DIAGNOSIS — A4102 Sepsis due to Methicillin resistant Staphylococcus aureus: Secondary | ICD-10-CM | POA: Diagnosis not present

## 2022-11-18 DIAGNOSIS — Z880 Allergy status to penicillin: Secondary | ICD-10-CM

## 2022-11-18 LAB — BASIC METABOLIC PANEL
Anion gap: 10 (ref 5–15)
BUN: 18 mg/dL (ref 8–23)
CO2: 26 mmol/L (ref 22–32)
Calcium: 8.5 mg/dL — ABNORMAL LOW (ref 8.9–10.3)
Chloride: 97 mmol/L — ABNORMAL LOW (ref 98–111)
Creatinine, Ser: 0.8 mg/dL (ref 0.61–1.24)
GFR, Estimated: >60 mL/min (ref >60)
Glucose, Bld: 124 mg/dL — ABNORMAL HIGH (ref 70–99)
Potassium: 3.9 mmol/L (ref 3.5–5.1)
Sodium: 133 mmol/L — ABNORMAL LOW (ref 135–145)

## 2022-11-18 LAB — HEMOGLOBIN AND HEMATOCRIT, BLOOD
HCT: 32.3 % — ABNORMAL LOW (ref 39.0–52.0)
Hemoglobin: 10.8 g/dL — ABNORMAL LOW (ref 13.0–17.0)

## 2022-11-18 LAB — HEPATITIS B SURFACE ANTIBODY, QUANTITATIVE: Hep B S AB Quant (Post): <3.1 m[IU]/mL — ABNORMAL LOW (ref >9.9)

## 2022-11-18 MED ORDER — OXYCODONE HCL 5 MG PO TABS: 5.0000 mg | ORAL_TABLET | ORAL | 0 refills | Status: DC | PRN

## 2022-11-18 MED ORDER — ZOLPIDEM TARTRATE 5 MG PO TABS: 5.0000 mg | ORAL_TABLET | Freq: Every evening | ORAL | 0 refills | Status: DC | PRN

## 2022-11-18 MED ORDER — ASPIRIN 81 MG PO TBEC: 81.0000 mg | DELAYED_RELEASE_TABLET | Freq: Two times a day (BID) | ORAL | 0 refills | Status: AC

## 2022-11-18 MED ORDER — CEFAZOLIN IV (FOR PTA / DISCHARGE USE ONLY): 2.0000 g | Freq: Three times a day (TID) | INTRAVENOUS | 0 refills | Status: AC

## 2022-11-18 MED ORDER — TRAMADOL HCL 50 MG PO TABS: 50.0000 mg | ORAL_TABLET | Freq: Four times a day (QID) | ORAL | 0 refills | Status: DC | PRN

## 2022-11-18 MED ORDER — METHOCARBAMOL 500 MG PO TABS: 500.0000 mg | ORAL_TABLET | Freq: Four times a day (QID) | ORAL | 0 refills | Status: DC

## 2022-11-18 NOTE — Plan of Care (Signed)
  Problem: Education: Goal: Knowledge of General Education information will improve Description: Including pain rating scale, medication(s)/side effects and non-pharmacologic comfort measures Outcome: Progressing   Problem: Health Behavior/Discharge Planning: Goal: Ability to manage health-related needs will improve Outcome: Progressing   Problem: Nutrition: Goal: Adequate nutrition will be maintained Outcome: Progressing   

## 2022-11-18 NOTE — Discharge Summary (Signed)
Orthopedic Discharge Summary      Patient ID: [name] [name] MRN: [id number] DOB/AGE: 1951/03/05 72 y.o.  Admit date: 11/14/2022 Discharge date: 11/18/2022  Admission Diagnoses: Principal Problem:   Infection of right knee [facility]) Status post total knee arthroplasty  Discharge Diagnoses:  Principal Problem:   Infection of right knee [facility])   Discharged Condition: fair  Hospital Course: Patient was admitted on February 12 for a suspected infection of a right artificial joint.  He was seen in the office that day and was sent to the hospital for admission.  He was taken to the operating room on February 13 for an I&D and poly exchange.  No events overnight.  Postop day 1 infectious disease was consulted.  At this time we did have some growth from the office samples that were taken that did start to show gram-positive cocci.  Antibiotics were given.  Patient slowly started to improve.  Postop day 2 patient is continuing to improve, pain is improving.  Infectious diseases following.  Waiting for culture results.  PICC line was placed.  Postop day 3 patient is continuing to improve, pain is now 5 out of 10, was a 10 out of 10 when admitted.  Had a mark with 1 session of physical therapy just to learn how to get up and get into bed.  Infectious disease continue to monitor and prescribe antibiotics for patient's home health.  Consults: ID  Significant Diagnostic Studies: labs: BMP, H and H, Sed Rate and CRP and microbiology: wound culture: positive for Staph aureus  Treatments: IV hydration, antibiotics: Ancef, daptomycin, amoxicillin, analgesia: acetaminophen and Tramadol, anticoagulation: ASA, therapies: PT, and surgery: Right TKA  Discharge Exam: Blood pressure 136/79, pulse 75, temperature 98.1 F (36.7 C), temperature source Oral, resp. rate 20, height '5\' 9"'$  (1.753 m), weight 104.3 kg, SpO2 96 %. General appearance: alert, cooperative, appears stated age, and no distress Extremities:  Limited ROM of his right knee, good dorsiflexion and plantar flexion of right knee.  Pulses: 2+ and symmetric Skin: Incision is healing, still some active drainage coming from incision site Neurologic: Grossly normal Incision/Wound: New dressings applied at time of discharge  Disposition: Discharge disposition: 01-Home or Self Care       Discharge Instructions     Call MD / Call 911   Complete by: As directed    If you experience chest pain or shortness of breath, CALL 911 and be transported to the hospital emergency room.  If you develope a fever above 101 F, pus (white drainage) or increased drainage or redness at the wound, or calf pain, call your surgeon's office.   Constipation Prevention   Complete by: As directed    Drink plenty of fluids.  Prune juice may be helpful.  You may use a stool softener, such as Colace (over the counter) 100 mg twice a day.  Use MiraLax (over the counter) for constipation as needed.   Diet - low sodium heart healthy   Complete by: As directed    Discharge instructions   Complete by: As directed    Follow up in the office in 1 week, Leave dressings on TTWB ASA 81 mg BID   Increase activity slowly as tolerated   Complete by: As directed    Post-operative opioid taper instructions:   Complete by: As directed    POST-OPERATIVE OPIOID TAPER INSTRUCTIONS: It is important to wean off of your opioid medication as soon as possible. If you do not need pain medication after  your surgery it is ok to stop day one. Opioids include: Codeine, Hydrocodone(Norco, Vicodin), Oxycodone(Percocet, oxycontin) and hydromorphone amongst others.  Long term and even short term use of opiods can cause: Increased pain response Dependence Constipation Depression Respiratory depression And more.  Withdrawal symptoms can include Flu like symptoms Nausea, vomiting And more Techniques to manage these symptoms Hydrate well Eat regular healthy meals Stay active Use  relaxation techniques(deep breathing, meditating, yoga) Do Not substitute Alcohol to help with tapering If you have been on opioids for less than two weeks and do not have pain than it is ok to stop all together.  Plan to wean off of opioids This plan should start within one week post op of your joint replacement. Maintain the same interval or time between taking each dose and first decrease the dose.  Cut the total daily intake of opioids by one tablet each day Next start to increase the time between doses. The last dose that should be eliminated is the evening dose.         Allergies as of 11/19/2022       Reactions   Escitalopram Oxalate Other (See Comments)   Anxiety, insomnia   Irbesartan Cough        Medication List     STOP taking these medications    acetaminophen 500 MG tablet Commonly known as: TYLENOL       TAKE these medications    ALLERGY EYE DROPS OP Place 1 drop into both eyes daily as needed (red/itchy eyes).   APPLE CIDER VINEGAR PO Take 1 capsule by mouth daily.   aspirin EC 81 MG tablet Take 1 tablet (81 mg total) by mouth 2 (two) times daily. Swallow whole. What changed: when to take this   calcium carbonate 500 MG chewable tablet Commonly known as: TUMS - dosed in mg elemental calcium Chew 2 tablets by mouth 2 (two) times daily as needed for indigestion or heartburn.   ceFAZolin  IVPB Commonly known as: ANCEF Inject 2 g into the vein every 8 (eight) hours. Indication:  MSSA prosthetic knee infection First Dose: No Last Day of Therapy:  12/27/2022 Labs - Once weekly:  CBC/D and BMP, Labs - Every other week:  ESR and CRP Method of administration: IV Push Method of administration may be changed at the discretion of home infusion pharmacist based upon assessment of the patient and/or caregiver's ability to self-administer the medication ordered.   docusate sodium 100 MG capsule Commonly known as: COLACE Take 200 mg by mouth 2 (two) times  daily.   Magnesium 500 MG Tabs Take 500 mg by mouth daily.   methocarbamol 500 MG tablet Commonly known as: ROBAXIN Take 1 tablet (500 mg total) by mouth 4 (four) times daily.   MULTI-VITAMIN PO Take 1 tablet by mouth daily.   naproxen 500 MG tablet Commonly known as: NAPROSYN Take 500 mg by mouth 2 (two) times daily.   oxyCODONE 5 MG immediate release tablet Commonly known as: Oxy IR/ROXICODONE Take 5-10 mg by mouth every 4 (four) hours as needed for severe pain. What changed: Another medication with the same name was added. Make sure you understand how and when to take each.   oxyCODONE 5 MG immediate release tablet Commonly known as: Roxicodone Take 1 tablet (5 mg total) by mouth every 4 (four) hours as needed for severe pain. What changed: You were already taking a medication with the same name, and this prescription was added. Make sure you understand how and  when to take each.   polyethylene glycol powder 17 GM/SCOOP powder Commonly known as: GLYCOLAX/MIRALAX Take 1 Container by mouth once.   sertraline 100 MG tablet Commonly known as: ZOLOFT TAKE 1 TABLET BY MOUTH EVERY DAY   sodium chloride 0.65 % Soln nasal spray Commonly known as: OCEAN Place 1 spray into both nostrils as needed for congestion.   tamsulosin 0.4 MG Caps capsule Commonly known as: FLOMAX Take 0.4 mg by mouth daily.   telmisartan 80 MG tablet Commonly known as: MICARDIS TAKE 1 TABLET BY MOUTH EVERY DAY   traMADol 50 MG tablet Commonly known as: ULTRAM Take 50 mg by mouth every 6 (six) hours as needed for moderate pain. What changed: Another medication with the same name was added. Make sure you understand how and when to take each.   traMADol 50 MG tablet Commonly known as: Ultram Take 1 tablet (50 mg total) by mouth every 6 (six) hours as needed. What changed: You were already taking a medication with the same name, and this prescription was added. Make sure you understand how and when to  take each.   Vitamin D2 50 MCG (2000 UT) Tabs Take 2,000 Units by mouth daily.   Zinc 30 MG Tabs Take 30 mg by mouth daily.   zolpidem 5 MG tablet Commonly known as: AMBIEN Take 1 tablet (5 mg total) by mouth at bedtime as needed for sleep.               Discharge Care Instructions  (From admission, onward)           Start     Ordered   11/18/22 0000  Change dressing on IV access line weekly and PRN  (Home infusion instructions - [facility] )        11/18/22 1002            Follow-up Information     [facility] Follow up.   Why: [facility] will provide IV antibiotics in the home after discharge.        Care, [facility] Follow up.   Specialty: Home Health Services Why: Alvis Lemmings will provide nursing after discharge for PICC line maintenance and weekly labs. Contact information: [address] [place] [address] [phone]                 Signed: Drue Novel Dr. Theda Sers, Rosanne Gutting [id number] 11/18/2022, 8:04 AM

## 2022-11-18 NOTE — Progress Notes (Signed)
Physical Therapy Treatment Patient Details Name: [name] [name] MRN: [id number] DOB: [birth year] Today's Date: 11/18/2022   History of Present Illness Pt s/p I&D and poly exchange of R TKR with initial TKR 10/25/22    PT Comments    Pt continues very motivated and with marked improvement in activity tolerance this pm.  Pt up to ambulate increased (but still limited) distance with RW and with no c/o dizziness.  Pt should progress to attempt stairs in am and hopes for dc home tomorrow.   Recommendations for follow up therapy are one component of a multi-disciplinary discharge planning process, led by the attending physician.  Recommendations may be updated based on patient status, additional functional criteria and insurance authorization.  Follow Up Recommendations  Follow physician's recommendations for discharge plan and follow up therapies     Assistance Recommended at Discharge Intermittent Supervision/Assistance  Patient can return home with the following A little help with walking and/or transfers;A little help with bathing/dressing/bathroom;Assistance with cooking/housework;Assist for transportation;Help with stairs or ramp for entrance   Equipment Recommendations  None recommended by PT    Recommendations for Other Services       Precautions / Restrictions Precautions Precautions: Knee;Fall;Other (comment) Precaution Comments: gentle ROM only Restrictions Weight Bearing Restrictions: Yes RLE Weight Bearing: Touchdown weight bearing     Mobility  Bed Mobility Overal bed mobility: Needs Assistance Bed Mobility: Sit to Supine     Supine to sit: Min assist Sit to supine: Min assist   General bed mobility comments: increased time with assist to manage R LE    Transfers Overall transfer level: Needs assistance Equipment used: Rolling walker (2 wheels) Transfers: Sit to/from Stand Sit to Stand: Min assist           General transfer comment: cues for LE  management and use of UEs to self assist.  Physical assist to bring weight up and fwd and to balance in initial standing    Ambulation/Gait Ambulation/Gait assistance: Min assist Gait Distance (Feet): 32 Feet Assistive device: Rolling walker (2 wheels) Gait Pattern/deviations: Step-to pattern, Decreased step length - right, Decreased step length - left, Shuffle Gait velocity: decr     General Gait Details: cues for sequence, posture and position from RW; Good follow through with TWB; distance ltd by fatigue but no c/o dizziness   Stairs             Wheelchair Mobility    Modified Rankin (Stroke Patients Only)       Balance Overall balance assessment: Needs assistance Sitting-balance support: Feet supported, No upper extremity supported Sitting balance-Leahy Scale: Good     Standing balance support: Bilateral upper extremity supported Standing balance-Leahy Scale: Poor                              Cognition Arousal/Alertness: Awake/alert Behavior During Therapy: WFL for tasks assessed/performed Overall Cognitive Status: Within Functional Limits for tasks assessed                                          Exercises Total Joint Exercises Ankle Circles/Pumps: AROM, Both, 10 reps, Supine Quad Sets: AROM, Both, 5 reps, Supine    General Comments        Pertinent Vitals/Pain Pain Assessment Pain Assessment: 0-10 Pain Score: 4  Pain Location: R knee Pain Descriptors / Indicators:  Aching, Grimacing, Sore Pain Intervention(s): Limited activity within patient's tolerance, Monitored during session, Premedicated before session    [place] expects to be discharged to:: Private residence Living Arrangements: Spouse/significant other Available Help at Discharge: Family;Available 24 hours/day Type of Home: House Home Access: Stairs to enter Entrance Stairs-Rails: Right;Left;Can reach both Entrance Stairs-Number of  Steps: 2   Home Layout: One level Home Equipment: Conservation officer, nature (2 wheels);Cane - single point;Grab bars - tub/shower;Grab bars - toilet      Prior Function            PT Goals (current goals can now be found in the care plan section) Acute Rehab PT Goals Patient Stated Goal: Regain IND PT Goal Formulation: With patient Time For Goal Achievement: 11/25/22 Potential to Achieve Goals: Good Progress towards PT goals: Progressing toward goals    Frequency    7X/week      PT Plan Current plan remains appropriate    Co-evaluation              AM-PAC PT "6 Clicks" Mobility   Outcome Measure  Help needed turning from your back to your side while in a flat bed without using bedrails?: A Little Help needed moving from lying on your back to sitting on the side of a flat bed without using bedrails?: A Little Help needed moving to and from a bed to a chair (including a wheelchair)?: A Little Help needed standing up from a chair using your arms (e.g., wheelchair or bedside chair)?: A Little Help needed to walk in hospital room?: A Little Help needed climbing 3-5 steps with a railing? : A Lot 6 Click Score: 17    End of Session Equipment Utilized During Treatment: Gait belt;Right knee immobilizer Activity Tolerance: Patient limited by fatigue Patient left: in bed;with call bell/phone within reach;with family/visitor present Nurse Communication: Mobility status PT Visit Diagnosis: Unsteadiness on feet (R26.81);Difficulty in walking, not elsewhere classified (R26.2);Pain Pain - Right/Left: Right Pain - part of body: Knee     Time: 1345-1410 PT Time Calculation (min) (ACUTE ONLY): 25 min  Charges:  $Gait Training: 8-22 mins $Therapeutic Activity: 8-22 mins                     Debe Coder PT Acute Rehabilitation Services Pager [phone] Office [phone]    Hailey Miles 11/18/2022, 2:31 PM

## 2022-11-18 NOTE — Progress Notes (Signed)
PHARMACY CONSULT NOTE FOR:  OUTPATIENT  PARENTERAL ANTIBIOTIC THERAPY (OPAT)  Indication: MSSA prosthetic knee infection Regimen: cefazolin 2g IV q8h End date: 12/27/2022  IV antibiotic discharge orders are pended. To discharging provider:  please sign these orders via discharge navigator,  Select New Orders & click on the button choice - Manage This Unsigned Work.     Thank you for allowing pharmacy to be a part of this patient's care.  Candie Mile 11/18/2022, 9:55 AM

## 2022-11-18 NOTE — TOC Progression Note (Addendum)
Transition of Care [facility]) - Progression Note    Patient Details  Name: [name] [name] MRN: [id number] Date of Birth: 24-Nov-1950  Transition of Care [facility]) CM/SW Contact  Henrietta Dine, RN Phone Number: 11/18/2022, 10:05 AM  Clinical Narrative:    OPAT order signed; HHRN Cindie at [facility] per Carolynn Sayers at [place]; Pam also says plan on 1st home dose of antibiotics around 10 PM; orders placed for PT eval; awaiting PT eval.  -[id number]- pt not likely to d/c today; awaiting HH orders; Carolynn Sayers at [place], and Georgiana Shore, Therapist, sports at [place] notified.  -[id number] orders received for HHPT set up w/ cory at [facility]; no d/c today.  Expected Discharge Plan: [place] Barriers to Discharge: Continued Medical Work up  Expected Discharge Plan and Services In-house Referral: Clinical Social Work   Post Acute Care Choice: [place] arrangements for the past 2 months: [place] Expected Discharge Date: 11/18/22               DME Arranged: N/A DME Agency: NA       HH Arranged: RN, IV Antibiotics HH Agency: [facility], [facility] Date [place]: 11/16/22   Representative spoke with at [place]: Osborne Casco)   Social Determinants of Health (SDOH) Interventions SDOH Screenings   Food Insecurity: No Food Insecurity (11/14/2022)  Housing: Low Risk  (11/14/2022)  Transportation Needs: No Transportation Needs (11/14/2022)  Utilities: Not At Risk (11/14/2022)  Depression (PHQ2-9): Low Risk  (07/14/2022)  Tobacco Use: Low Risk  (11/17/2022)    Readmission Risk Interventions     No data to display

## 2022-11-18 NOTE — Progress Notes (Addendum)
Subjective: No new complaints   Antibiotics:  Anti-infectives (From admission, onward)    Start     Dose/Rate Route Frequency Ordered Stop   11/16/22 2000  DAPTOmycin (CUBICIN) 650 mg in sodium chloride 0.9 % IVPB  Status:  Discontinued        8 mg/kg  84.1 kg (Adjusted) 126 mL/hr over 30 Minutes Intravenous Daily 11/16/22 1122 11/18/22 0940   11/16/22 1145  amoxicillin (AMOXIL) capsule 500 mg        500 mg Oral  Once 11/16/22 1052 11/16/22 1211   11/16/22 0200  ceFAZolin (ANCEF) IVPB 2g/100 mL premix        2 g 200 mL/hr over 30 Minutes Intravenous Every 8 hours 11/15/22 1907     11/15/22 2200  vancomycin (VANCOREADY) IVPB 1750 mg/350 mL  Status:  Discontinued        1,750 mg 175 mL/hr over 120 Minutes Intravenous Every 48 hours 11/15/22 1907 11/16/22 1020   11/15/22 1030  ceFAZolin (ANCEF) IVPB 2g/100 mL premix        2 g 200 mL/hr over 30 Minutes Intravenous On call to O.R. 11/15/22 1015 11/15/22 1745       Medications: Scheduled Meds:  aspirin  81 mg Oral BID   Chlorhexidine Gluconate Cloth  6 each Topical Daily   irbesartan  75 mg Oral Daily   polyethylene glycol  17 g Oral Daily   sertraline  100 mg Oral Daily   sodium chloride flush  10-40 mL Intracatheter Q12H   tamsulosin  0.4 mg Oral Daily   traMADol  50 mg Oral Q6H   Continuous Infusions:   ceFAZolin (ANCEF) IV 2 g (11/18/22 0227)   methocarbamol (ROBAXIN) IV     PRN Meds:.acetaminophen, diphenhydrAMINE, diphenhydrAMINE, docusate sodium, EPINEPHrine, HYDROmorphone (DILAUDID) injection, HYDROmorphone, methocarbamol **OR** methocarbamol (ROBAXIN) IV, oxyCODONE, sodium chloride, sodium chloride flush, zolpidem    Objective: Weight change:   Intake/Output Summary (Last 24 hours) at 11/18/2022 0945 Last data filed at 11/18/2022 0856 Gross per 24 hour  Intake 1090 ml  Output 1050 ml  Net 40 ml    Blood pressure 115/74, pulse 79, temperature 98.6 F (37 C), resp. rate 19, height 5' 9"$  (1.753  m), weight 104.3 kg, SpO2 95 %. Temp:  [98.1 F (36.7 C)-99.2 F (37.3 C)] 98.6 F (37 C) (02/16 0936) Pulse Rate:  [75-85] 79 (02/16 0936) Resp:  [16-20] 19 (02/16 0936) BP: (115-153)/(74-79) 115/74 (02/16 0936) SpO2:  [95 %-96 %] 95 % (02/16 0936)  Physical Exam: Physical Exam Constitutional:      Appearance: He is well-developed.  HENT:     Head: Normocephalic and atraumatic.  Eyes:     Conjunctiva/sclera: Conjunctivae normal.  Cardiovascular:     Rate and Rhythm: Normal rate and regular rhythm.  Pulmonary:     Effort: Pulmonary effort is normal. No respiratory distress.     Breath sounds: No wheezing.  Abdominal:     General: There is no distension.     Palpations: Abdomen is soft.  Musculoskeletal:        General: Normal range of motion.     Cervical back: Normal range of motion and neck supple.  Skin:    General: Skin is warm and dry.     Findings: No erythema or rash.  Neurological:     General: No focal deficit present.     Mental Status: He is alert and oriented to person, place, and time.  Psychiatric:  Mood and Affect: Mood normal.        Behavior: Behavior normal.        Thought Content: Thought content normal.        Judgment: Judgment normal.     Knee dressed  CBC:    BMET Recent Labs    11/17/22 0722 11/18/22 0342  NA 132* 133*  K 3.7 3.9  CL 95* 97*  CO2 25 26  GLUCOSE 127* 124*  BUN 20 18  CREATININE 0.88 0.80  CALCIUM 8.6* 8.5*      Liver Panel  No results for input(s): "PROT", "ALBUMIN", "AST", "ALT", "ALKPHOS", "BILITOT", "BILIDIR", "IBILI" in the last 72 hours.     Sedimentation Rate Recent Labs    11/17/22 0339  ESRSEDRATE 97*    C-Reactive Protein Recent Labs    11/17/22 0339  CRP 27.6*     Micro Results: Recent Results (from the past 720 hour(s))  Aerobic/Anaerobic Culture w Gram Stain (surgical/deep wound)     Status: None (Preliminary result)   Collection Time: 11/15/22  5:34 PM   Specimen:  Joint, Other; Body Fluid  Result Value Ref Range Status   Specimen Description   Final    FLUID R KNEE JOINT Performed at [place] [address] [place], [place] [address]    Special Requests   Final    NONE Performed at [facility], [place] [address], [place], [place] [address]    Gram Stain   Final    RARE WBC PRESENT, PREDOMINANTLY PMN MODERATE GRAM POSITIVE COCCI Performed at [facility], [place] [address] [place], [place] [address]    Culture   Final    ABUNDANT STAPHYLOCOCCUS AUREUS NO ANAEROBES ISOLATED; CULTURE IN PROGRESS FOR 5 DAYS    Report Status PENDING  Incomplete   Organism ID, Bacteria STAPHYLOCOCCUS AUREUS  Final      Susceptibility   Staphylococcus aureus - MIC*    CIPROFLOXACIN <=0.5 SENSITIVE Sensitive     ERYTHROMYCIN RESISTANT Resistant     GENTAMICIN <=0.5 SENSITIVE Sensitive     OXACILLIN 0.5 SENSITIVE Sensitive     TETRACYCLINE <=1 SENSITIVE Sensitive     VANCOMYCIN 1 SENSITIVE Sensitive     TRIMETH/SULFA <=10 SENSITIVE Sensitive     CLINDAMYCIN RESISTANT Resistant     RIFAMPIN <=0.5 SENSITIVE Sensitive     Inducible Clindamycin POSITIVE Resistant     * ABUNDANT STAPHYLOCOCCUS AUREUS    Studies/Results: [name] EKG SITE RITE  Result Date: 11/16/2022 If Site Rite image not attached, placement could not be confirmed due to current cardiac rhythm.     Assessment/Plan:  INTERVAL HISTORY: Cultures + MSSA   Principal Problem:   Infection of right knee (HCC)    [name] [name] is a 72 y.o. male with  PJI sp I and D and poly-exchange  #1 PJI  Narrow to cefazolin and will provide 6 weeks of IV abx followed by oral antibiotics  Diagnosis:  MSSA prosthetic knee infection   Culture Result: MSSA  Allergies  Allergen Reactions   Escitalopram Oxalate Other (See Comments)    Anxiety, insomnia   Irbesartan Cough    OPAT Orders Discharge antibiotics to be given via PICC line Discharge  antibiotics: cefazolin 2g IV q8h  Duration: 6 weeks End Date: 12/27/2022   [facility] Care Per Protocol:  Home health RN for IV administration and teaching; PICC line care and labs.    Labs weekly while on IV antibiotics: _x_ CBC with differential  _x_ BMP  _x_ CRP _x_ ESR   x__ Please pull PIC at completion of IV antibiotics __ Please leave PIC in place until doctor has seen patient or been notified  Fax weekly labs to [phone]  Clinic Follow Up Appt:   BRIGHT KILLE has an appointment on [phone] at [place] with Dr. Juleen China  at  [facility] for Infectious Disease, which  is located in the [facility] at  [address] in [place].  [address], which is located to the left of the elevators.  Phone: [phone]  Fax: [phone]  [email or web address]  The patient should arrive 30 minutes prior to their appoitment.   #2 PCN allergy: had no problems with amoxicillin and if not done will remove from allergy list  I spent 52 minutes with the patient including than 50% of the time in face to face counseling of the patient regarding his prosthetic joint infection along with review of medical records in preparation for the visit and during the visit and in coordination of his care.   I will sign off for now.  Please call with further questions.   LOS: 4 days   Alcide Evener 11/18/2022, 9:45 AM

## 2022-11-18 NOTE — Evaluation (Signed)
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  This am, pt up to mobilize but very limited by increasing dizziness - BP 154/86 - RN aware.  Pt very motivated and should progress to dc home with family assist.     Recommendations for follow up therapy are one component of a multi-disciplinary discharge planning process, led by the attending physician.  Recommendations may be updated based on patient status, additional functional criteria and insurance authorization.  Follow Up Recommendations Follow physician's recommendations for discharge plan and follow up therapies      Assistance Recommended at Discharge Intermittent Supervision/Assistance  Patient can return home with the following  A little help with walking and/or transfers;A little help with bathing/dressing/bathroom;Assistance with cooking/housework;Assist for transportation;Help with stairs or ramp for entrance    Equipment Recommendations None recommended by PT  Recommendations for Other Services       Functional Status Assessment Patient has had a recent decline in their functional status and demonstrates the ability to make significant improvements in function in a reasonable and predictable amount of time.     Precautions / Restrictions Precautions Precautions: Knee;Fall;Other (comment) Precaution Comments: gentle ROM only Restrictions Weight Bearing Restrictions: Yes RLE Weight Bearing: Touchdown weight bearing      Mobility  Bed Mobility Overal bed mobility: Needs Assistance Bed Mobility: Supine to Sit     Supine to sit: Min assist     General bed mobility comments: increased time  with assist to manage R LE    Transfers Overall transfer level: Needs assistance Equipment used: Rolling walker (2 wheels) Transfers: Sit to/from Stand Sit to Stand: Min assist, Mod assist           General transfer comment: cues for LE management and use of UEs to self assist.  Physical assist to bring weight up and fwd and to balance in initial standing    Ambulation/Gait Ambulation/Gait assistance: Min assist, +2 safety/equipment Gait Distance (Feet): 3 Feet Assistive device: Rolling walker (2 wheels) Gait Pattern/deviations: Step-to pattern, Decreased step length - right, Decreased step length - left, Shuffle Gait velocity: decr     General Gait Details: cues for sequence, posture and position from RW; Good follow through with TWB; distance ltd by increasing dizziness - BP 154/86  Stairs            Wheelchair Mobility    Modified Rankin (Stroke Patients Only)       Balance Overall balance assessment: Needs assistance Sitting-balance support: Feet supported, No upper extremity supported Sitting balance-Leahy Scale: Good     Standing balance support: Bilateral upper extremity supported Standing balance-Leahy Scale: Poor                               Pertinent Vitals/Pain Pain Assessment Pain Assessment: 0-10 Pain Score: 4  Pain Location: R knee Pain Descriptors / Indicators: Aching, Grimacing, Sore Pain Intervention(s): Limited activity within patient's tolerance, Monitored during session, Premedicated before session    Home Living Family/patient expects to be discharged to:: Private residence Living Arrangements: Spouse/significant other Available Help at Discharge: Family;Available 24 hours/day Type of Home: House Home Access: Stairs  to enter Entrance Stairs-Rails: Right;Left;Can reach both Entrance Stairs-Number of Steps: 2   Home Layout: One level Home Equipment: Conservation officer, nature (2 wheels);Cane - single point;Grab bars -  tub/shower;Grab bars - toilet      Prior Function Prior Level of Function : Independent/Modified Independent             Mobility Comments: using RW since TKR ADLs Comments: Pt reports IND with ADL prior to this admit     Hand Dominance        Extremity/Trunk Assessment   Upper Extremity Assessment Upper Extremity Assessment: Overall WFL for tasks assessed    Lower Extremity Assessment Lower Extremity Assessment: RLE deficits/detail RLE Deficits / Details: Pt able to pump ankle with ltd ROM and perform gentle QS but further ROM deferred 2* pain level RLE: Unable to fully assess due to pain    Cervical / Trunk Assessment Cervical / Trunk Assessment: Normal  Communication   Communication: No difficulties  Cognition Arousal/Alertness: Awake/alert Behavior During Therapy: WFL for tasks assessed/performed Overall Cognitive Status: Within Functional Limits for tasks assessed                                          General Comments      Exercises Total Joint Exercises Ankle Circles/Pumps: AROM, Both, 10 reps, Supine Quad Sets: AROM, Both, 5 reps, Supine   Assessment/Plan    PT Assessment Patient needs continued PT services  PT Problem List Decreased strength;Decreased range of motion;Decreased activity tolerance;Decreased balance;Decreased mobility;Decreased knowledge of use of DME;Pain       PT Treatment Interventions DME instruction;Gait training;Therapeutic activities;Functional mobility training;Stair training;Therapeutic exercise;Patient/family education;Balance training    PT Goals (Current goals can be found in the Care Plan section)  Acute Rehab PT Goals Patient Stated Goal: Regain IND PT Goal Formulation: With patient Time For Goal Achievement: 11/25/22 Potential to Achieve Goals: Good    Frequency 7X/week     Co-evaluation               AM-PAC PT "6 Clicks" Mobility  Outcome Measure Help needed turning from your back  to your side while in a flat bed without using bedrails?: A Little Help needed moving from lying on your back to sitting on the side of a flat bed without using bedrails?: A Little Help needed moving to and from a bed to a chair (including a wheelchair)?: A Little Help needed standing up from a chair using your arms (e.g., wheelchair or bedside chair)?: A Little Help needed to walk in hospital room?: Total Help needed climbing 3-5 steps with a railing? : Total 6 Click Score: 14    End of Session Equipment Utilized During Treatment: Gait belt;Right knee immobilizer Activity Tolerance: Patient limited by fatigue;Patient limited by pain Patient left: in chair;with call bell/phone within reach Nurse Communication: Mobility status PT Visit Diagnosis: Unsteadiness on feet (R26.81);Difficulty in walking, not elsewhere classified (R26.2);Pain Pain - Right/Left: Right Pain - part of body: Knee    Time: 1020-1051 PT Time Calculation (min) (ACUTE ONLY): 31 min   Charges:   PT Evaluation $PT Eval Low Complexity: 1 Low PT Treatments $Therapeutic Activity: 8-22 mins        Debe Coder PT Acute Rehabilitation Services Pager [phone] Office [phone]   Morganna Styles 11/18/2022, 12:09 PM

## 2022-11-18 NOTE — Progress Notes (Signed)
Subjective: 3 Days Post-Op Procedure(s) (LRB): IRRIGATION AND DEBRIDEMENT KNEE WITH  POLY EXCHANGE (Right) Patient reports pain as 5 on 0-10 scale.   He reports improvement Still having pain but getting better.   Objective: Vital signs in last 24 hours: Temp:  [98.1 F (36.7 C)-99.2 F (37.3 C)] 98.1 F (36.7 C) (02/16 0559) Pulse Rate:  [75-85] 75 (02/16 0559) Resp:  [16-20] 20 (02/16 0559) BP: (136-153)/(77-79) 136/79 (02/16 0559) SpO2:  [95 %-96 %] 96 % (02/16 0559)  Intake/Output from previous day: 02/15 0701 - 02/16 0700 In: 1330 [P.O.:1080; IV Piggyback:250] Out: 1150 [Urine:1150] Intake/Output this shift: No intake/output data recorded.  Recent Labs    11/16/22 0354 11/17/22 0722 11/18/22 0342  HGB 11.6* 10.9* 10.8*   Recent Labs    11/17/22 0722 11/18/22 0342  HCT 32.8* 32.3*   Recent Labs    11/17/22 0722 11/18/22 0342  NA 132* 133*  K 3.7 3.9  CL 95* 97*  CO2 25 26  BUN 20 18  CREATININE 0.88 0.80  GLUCOSE 127* 124*  CALCIUM 8.6* 8.5*   No results for input(s): "LABPT", "INR" in the last 72 hours.  Neurologically intact Neurovascular intact Sensation intact distally Intact pulses distally Dorsiflexion/Plantar flexion intact Incision: dressing C/D/I No cellulitis present Compartment soft Still drainage coming from incision site  Hemovac: less then 10 Assessment/Plan: 3 Days Post-Op Procedure(s) (LRB): IRRIGATION AND DEBRIDEMENT KNEE WITH  POLY EXCHANGE (Right) Advance diet Up with therapy Just want therapy to come by and help him work on getting around with walker Will be going home on IV abx per ID Drain removed today Remain in knee immobilizer Still pending sensativities As long as ID is okay with patient going home today once patient gets a session on PT in he is okay for d/c   Anticipated LOS equal to or greater than 2 midnights due to - Age 98 and older with one or more of the following:  - Obesity  - Expected need for  hospital services (PT, OT, Nursing) required for safe  discharge  - Anticipated need for postoperative skilled nursing care or inpatient rehab  - Active co-morbidities: postop infection OR   - Unanticipated findings during/Post Surgery: Infected knee joint or operative site  - Patient is a high risk of re-admission due to: None   Drue Novel, PA-C [facility] with Dr. Theda Sers [phone] 11/18/2022, 7:28 AM

## 2022-11-19 DIAGNOSIS — M009 Pyogenic arthritis, unspecified: Secondary | ICD-10-CM | POA: Diagnosis not present

## 2022-11-19 LAB — BASIC METABOLIC PANEL
Anion gap: 9 (ref 5–15)
BUN: 18 mg/dL (ref 8–23)
CO2: 25 mmol/L (ref 22–32)
Calcium: 8.4 mg/dL — ABNORMAL LOW (ref 8.9–10.3)
Chloride: 100 mmol/L (ref 98–111)
Creatinine, Ser: 0.77 mg/dL (ref 0.61–1.24)
GFR, Estimated: >60 mL/min (ref >60)
Glucose, Bld: 101 mg/dL — ABNORMAL HIGH (ref 70–99)
Potassium: 4.1 mmol/L (ref 3.5–5.1)
Sodium: 134 mmol/L — ABNORMAL LOW (ref 135–145)

## 2022-11-19 LAB — HEMOGLOBIN AND HEMATOCRIT, BLOOD
HCT: 33.4 % — ABNORMAL LOW (ref 39.0–52.0)
Hemoglobin: 11 g/dL — ABNORMAL LOW (ref 13.0–17.0)

## 2022-11-19 NOTE — Progress Notes (Signed)
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  Pt balance continues to improve but pt continues to fatigue easily and is pain limited.  Will follow up after additional pain meds.   Recommendations for follow up therapy are one component of a multi-disciplinary discharge planning process, led by the attending physician.  Recommendations may be updated based on patient status, additional functional criteria and insurance authorization.  Follow Up Recommendations  Follow physician's recommendations for discharge plan and follow up therapies     Assistance Recommended at Discharge Intermittent Supervision/Assistance  Patient can return home with the following A little help with walking and/or transfers;A little help with bathing/dressing/bathroom;Assistance with cooking/housework;Assist for transportation;Help with stairs or ramp for entrance   Equipment Recommendations  None recommended by PT    Recommendations for Other Services       Precautions / Restrictions Precautions Precautions: Knee;Fall;Other (comment) Precaution Comments: gentle ROM only Required Braces or Orthoses: Knee Immobilizer - Right Knee Immobilizer - Right: On at all times Restrictions Weight Bearing Restrictions: Yes RLE Weight Bearing: Touchdown weight bearing     Mobility  Bed Mobility Overal bed mobility: Needs Assistance Bed Mobility: Supine to Sit     Supine to sit: Min assist     General bed mobility comments: increased time with assist to manage R LE    Transfers Overall transfer level: Needs assistance Equipment used: Rolling walker (2 wheels) Transfers: Sit to/from Stand Sit to  Stand: Min assist, Min guard           General transfer comment: cues for LE management and use of UEs to self assist.  Physical assist to bring weight up and fwd and to balance in initial standing; increased assist to stand from [facility]    Ambulation/Gait Ambulation/Gait assistance: Min assist, Min guard Gait Distance (Feet): 15 Feet (15' twice to/from bathroom) Assistive device: Rolling walker (2 wheels) Gait Pattern/deviations: Step-to pattern, Decreased step length - right, Decreased step length - left, Shuffle Gait velocity: decr     General Gait Details: cues for sequence, posture and position from RW; Good follow through with TWB; distance ltd by fatigue but no c/o dizziness   Stairs             Wheelchair Mobility    Modified Rankin (Stroke Patients Only)       Balance Overall balance assessment: Needs assistance Sitting-balance support: Feet supported, No upper extremity supported Sitting balance-Leahy Scale: Good     Standing balance support: Single extremity supported Standing balance-Leahy Scale: Poor                              Cognition Arousal/Alertness: Awake/alert Behavior During Therapy: WFL for tasks assessed/performed Overall Cognitive Status: Within Functional Limits for tasks assessed                                          Exercises Total Joint Exercises Ankle Circles/Pumps: AROM, Both, 10 reps, Supine Quad Sets: AROM, Both, 5 reps, Supine    General Comments  Pertinent Vitals/Pain Pain Assessment Pain Assessment: 0-10 Pain Score: 6  Pain Location: R knee Pain Descriptors / Indicators: Aching, Grimacing, Sore Pain Intervention(s): Limited activity within patient's tolerance, Monitored during session, Premedicated before session, Patient requesting pain meds-RN notified    Home Living                          Prior Function            PT Goals (current goals can now be found  in the care plan section) Acute Rehab PT Goals Patient Stated Goal: Regain IND PT Goal Formulation: With patient Time For Goal Achievement: 11/25/22 Potential to Achieve Goals: Good Progress towards PT goals: Progressing toward goals    Frequency    7X/week      PT Plan Current plan remains appropriate    Co-evaluation              AM-PAC PT "6 Clicks" Mobility   Outcome Measure  Help needed turning from your back to your side while in a flat bed without using bedrails?: A Little Help needed moving from lying on your back to sitting on the side of a flat bed without using bedrails?: A Little Help needed moving to and from a bed to a chair (including a wheelchair)?: A Little Help needed standing up from a chair using your arms (e.g., wheelchair or bedside chair)?: A Little Help needed to walk in hospital room?: A Little Help needed climbing 3-5 steps with a railing? : A Lot 6 Click Score: 17    End of Session Equipment Utilized During Treatment: Gait belt;Right knee immobilizer Activity Tolerance: Patient limited by fatigue Patient left: in chair;with call bell/phone within reach;with family/visitor present Nurse Communication: Mobility status PT Visit Diagnosis: Unsteadiness on feet (R26.81);Difficulty in walking, not elsewhere classified (R26.2);Pain Pain - Right/Left: Right Pain - part of body: Knee     Time: 0916-0950 PT Time Calculation (min) (ACUTE ONLY): 34 min  Charges:  $Gait Training: 8-22 mins $Therapeutic Activity: 8-22 mins                     [place] Pager [phone] Office [phone]    [facility] 11/19/2022, 10:49 AM

## 2022-11-19 NOTE — Progress Notes (Signed)
    Subjective:  Patient reports pain as mild to moderate.  Denies N/V/CP/SOB/Abd pain. He reports some pain that has been improving. Tennis Must denies any tingling or numbness in LE bilaterally. He is hopeful for discharge home.   Answered questions from patient and his wife this morning.   Objective:   VITALS:   Vitals:   11/18/22 0936 11/18/22 1416 11/18/22 2125 11/19/22 0614  BP: 115/74 130/69 (!) 146/75 134/70  Pulse: 79 80 78 73  Resp: 19 18 16 20  $ Temp: 98.6 F (37 C) 97.8 F (36.6 C) 98.1 F (36.7 C) 98.3 F (36.8 C)  TempSrc:   Oral Oral  SpO2: 95% 96% 97% 99%  Weight:      Height:        NAD Neurologically intact Neurovascular intact Sensation intact distally Intact pulses distally Dorsiflexion/Plantar flexion intact Incision: dressing had some drainage. New dressing over incision and drain site this morning.  No cellulitis present Compartment soft Still drainage coming from incision site  Lab Results  Component Value Date   WBC 9.5 11/14/2022   HGB 11.0 (L) 11/19/2022   HCT 33.4 (L) 11/19/2022   MCV 94.7 11/14/2022   PLT 214 11/14/2022   BMET    Component Value Date/Time   NA 134 (L) 11/19/2022 0322   K 4.1 11/19/2022 0322   CL 100 11/19/2022 0322   CO2 25 11/19/2022 0322   GLUCOSE 101 (H) 11/19/2022 0322   BUN 18 11/19/2022 0322   CREATININE 0.77 11/19/2022 0322   CALCIUM 8.4 (L) 11/19/2022 0322   GFRNONAA >60 11/19/2022 0322     Assessment/Plan: 4 Days Post-Op   Principal Problem:   Infection of right knee (HCC) Active Problems:   Sepsis due to methicillin resistant Staphylococcus aureus (MRSA) (HCC)   Penicillin allergy   WBAT with walker Knee immobilizer at all times.  DVT ppx: Aspirin, SCDs, TEDS PO pain control PT/OT: Progressed well with PT yesterday. Still needs stair training.  Dispo:  - Up with therapy. Just want therapy to come by and help him work on getting around with walker -Will be going home on IV abx per ID - Remain in  knee immobilizer - Still pending sensativities - As long as ID is okay with patient going home today once patient gets a session on PT in he is okay for d/c   Charlott Rakes, PA-C 11/19/2022, 10:28 AM    [facility]  [place] [address], [place], [place] [address] Phone: [phone] [email or web address] Facebook  [employer]

## 2022-11-19 NOTE — Progress Notes (Signed)
PT AVS reviewed and pt verbalized understanding of teaching. Home health has been notified of discharge. PT going home with wife as transportation and PICC line in place.

## 2022-11-19 NOTE — Progress Notes (Signed)
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  Spouse present for session.  Pt and spouse state a little apprehensive but would like to dc home this date.  Recommendations for follow up therapy are one component of a multi-disciplinary discharge planning process, led by the attending physician.  Recommendations may be updated based on patient status, additional functional criteria and insurance authorization.  Follow Up Recommendations  Follow physician's recommendations for discharge plan and follow up therapies     Assistance Recommended at Discharge Intermittent Supervision/Assistance  Patient can return home with the following A little help with walking and/or transfers;A little help with bathing/dressing/bathroom;Assistance with cooking/housework;Assist for transportation;Help with stairs or ramp for entrance   Equipment Recommendations  None recommended by PT    Recommendations for Other Services       Precautions / Restrictions Precautions Precautions: Knee;Fall;Other (comment) Precaution Comments: gentle ROM only Required Braces or Orthoses: Knee Immobilizer - Right Knee Immobilizer - Right: On at all times Restrictions Weight Bearing Restrictions: Yes RLE Weight Bearing: Touchdown weight bearing     Mobility  Bed Mobility Overal bed mobility: Needs Assistance Bed Mobility: Supine to Sit     Supine to sit: Min assist     General bed mobility comments: Pt up in chair and requests back to same    Transfers Overall transfer level: Needs assistance Equipment used: Rolling walker (2 wheels) Transfers: Sit  to/from Stand Sit to Stand: Min guard           General transfer comment: cues for LE management and use of UEs to self assist    Ambulation/Gait Ambulation/Gait assistance: Min guard Gait Distance (Feet): 40 Feet (and additional 10') Assistive device: Rolling walker (2 wheels) Gait Pattern/deviations: Step-to pattern, Decreased step length - right, Decreased step length - left, Shuffle Gait velocity: decr     General Gait Details: cues for sequence, posture and position from RW; Good follow through with TWB; distance ltd by fatigue but no c/o dizziness   Stairs Stairs: Yes Stairs assistance: Min assist Stair Management: No rails, Two rails, Step to pattern, Backwards, Forwards, With walker Number of Stairs: 4 General stair comments: 3 stairs with bil rails; single step bkwd with RW.  cues for sequence   Wheelchair Mobility    Modified Rankin (Stroke Patients Only)       Balance Overall balance assessment: Needs assistance Sitting-balance support: Feet supported, No upper extremity supported Sitting balance-Leahy Scale: Good     Standing balance support: Single extremity supported Standing balance-Leahy Scale: Poor                              Cognition Arousal/Alertness: Awake/alert Behavior During Therapy: WFL for tasks assessed/performed Overall Cognitive Status: Within Functional Limits for tasks assessed                                          Exercises Total Joint Exercises Ankle Circles/Pumps: AROM, Both, 10 reps, Supine Quad Sets: AROM, Both, 5 reps, Supine  General Comments        Pertinent Vitals/Pain Pain Assessment Pain Assessment: 0-10 Pain Score: 6  Pain Location: R knee Pain Descriptors / Indicators: Aching, Grimacing, Sore Pain Intervention(s): Limited activity within patient's tolerance, Monitored during session, Premedicated before session    Home Living                          Prior  Function            PT Goals (current goals can now be found in the care plan section) Acute Rehab PT Goals Patient Stated Goal: Regain IND PT Goal Formulation: With patient Time For Goal Achievement: 11/25/22 Potential to Achieve Goals: Good Progress towards PT goals: Progressing toward goals    Frequency    7X/week      PT Plan Current plan remains appropriate    Co-evaluation              AM-PAC PT "6 Clicks" Mobility   Outcome Measure  Help needed turning from your back to your side while in a flat bed without using bedrails?: A Little Help needed moving from lying on your back to sitting on the side of a flat bed without using bedrails?: A Little Help needed moving to and from a bed to a chair (including a wheelchair)?: A Little Help needed standing up from a chair using your arms (e.g., wheelchair or bedside chair)?: A Little Help needed to walk in hospital room?: A Little Help needed climbing 3-5 steps with a railing? : A Little 6 Click Score: 18    End of Session Equipment Utilized During Treatment: Gait belt;Right knee immobilizer Activity Tolerance: Patient limited by fatigue Patient left: in chair;with call bell/phone within reach;with family/visitor present Nurse Communication: Mobility status PT Visit Diagnosis: Unsteadiness on feet (R26.81);Difficulty in walking, not elsewhere classified (R26.2);Pain Pain - Right/Left: Right Pain - part of body: Knee     Time: [id number] PT Time Calculation (min) (ACUTE ONLY): 27 min  Charges:  $Gait Training: 8-22 mins $Therapeutic Activity: 8-22 mins                     Debe Coder PT Acute Rehabilitation Services Pager [phone] Office [phone]    Diangelo Radel 11/19/2022, 12:40 PM

## 2022-11-19 NOTE — TOC Transition Note (Signed)
Transition of Care [facility]) - CM/SW Discharge Note   Patient Details  Name: [name] [name] MRN: [id number] Date of Birth: 1951-01-31  Transition of Care [facility]) CM/SW Contact:  Henrietta Dine, RN Phone Number: 11/19/2022, 12:43 PM   Clinical Narrative:    D/C orders received; HHRN/PT previously arranged w/ [facility]; notified Cindie, RN and Tommi Rumps at agency; OPAT previously arranged w/ [facility]; notified Carolynn Sayers; pt and wife agree to d/c plan; no TOC    Final next level of care: Home w Home Health Services Barriers to Discharge: No Barriers Identified   Patient Goals and CMS Choice CMS Medicare.gov Compare Post Acute Care list provided to:: Patient Choice offered to / list presented to : Patient  Discharge Placement                    Name of family member notified: [name] (wife) at bedside Patient and family notified of of transfer: 11/19/22  Discharge Plan and Services Additional resources added to the After Visit Summary for   In-house Referral: Clinical Social Work   Post Acute Care Choice: Home Health          DME Arranged: N/A DME Agency: NA       HH Arranged: PT HH Agency: [place] Date [facility] Agency Contacted: 11/18/22 Time [place]: 1508 Representative spoke with at [place]: [place] Determinants of Health ([place]) Interventions SDOH Screenings   Food Insecurity: No Food Insecurity (11/14/2022)  Housing: Low Risk  (11/14/2022)  Transportation Needs: No Transportation Needs (11/14/2022)  Utilities: Not At Risk (11/14/2022)  Depression (PHQ2-9): Low Risk  (07/14/2022)  Tobacco Use: Low Risk  (11/17/2022)     Readmission Risk Interventions     No data to display

## 2022-11-20 DIAGNOSIS — T8453XA Infection and inflammatory reaction due to internal right knee prosthesis, initial encounter: Secondary | ICD-10-CM | POA: Diagnosis not present

## 2022-11-20 DIAGNOSIS — M00861 Arthritis due to other bacteria, right knee: Secondary | ICD-10-CM | POA: Diagnosis not present

## 2022-11-20 DIAGNOSIS — K219 Gastro-esophageal reflux disease without esophagitis: Secondary | ICD-10-CM | POA: Diagnosis not present

## 2022-11-20 DIAGNOSIS — F32A Depression, unspecified: Secondary | ICD-10-CM | POA: Diagnosis not present

## 2022-11-20 DIAGNOSIS — I10 Essential (primary) hypertension: Secondary | ICD-10-CM | POA: Diagnosis not present

## 2022-11-20 LAB — AEROBIC/ANAEROBIC CULTURE W GRAM STAIN (SURGICAL/DEEP WOUND)

## 2022-11-21 ENCOUNTER — Encounter: Payer: Self-pay | Admitting: *Deleted

## 2022-11-21 ENCOUNTER — Telehealth: Payer: Self-pay | Admitting: *Deleted

## 2022-11-21 NOTE — Transitions of Care (Post Inpatient/ED Visit) (Signed)
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 How have you been since you were released from the hospital?: Better Any questions or concerns?: No  Items Reviewed: Did you receive and understand the discharge instructions provided?: Yes Medications obtained and verified?: Yes (Medications Reviewed) (full medication review completed; no concerns or discrepancies identified; patient verified obtained/ is taking all newly prescribed medications; denies concerns/ questions around medications; wife assists with medication management at home) Any new allergies since your discharge?: No Dietary orders reviewed?: No Do you have support at home?: Yes People in Home: spouse Name of Support/Comfort Primary Source: spouse Altha Harm assisting as needed/ indicated with care needs; reports he is essentially independent in self-care activities  [place] and Equipment/Supplies: [place] Ordered?: Yes Name of [place]:: [name] IV infusion/ [place] set up a time to come to your home?: Yes ([name] arrived at home yesterday) [place] Visit Date: 11/20/22 Any new equipment or medical supplies ordered?: No  Functional Questionnaire: Do you need assistance with bathing/showering or dressing?: Yes Do you need assistance with meal preparation?: Yes Do you need assistance with eating?: No Do you have difficulty maintaining continence: No Do you need assistance with getting out of bed/getting out of a chair/moving?: Yes Do you have difficulty managing or taking  your medications?: Yes  Folllow up appointments reviewed: PCP Follow-up appointment confirmed?: No (verified no hospital follow up PCP office visit recommended at time of hospital discharge- verified had specialist orthopedic provider office visit today) MD Provider Line Number:[phone] Given: No [place] [facility] Follow-up appointment confirmed?: Yes Date of Specialist follow-up appointment?: 11/21/22 Follow-Up Specialty Provider:: orthopedic provider- verified attended appointment today as scheduled Do you need transportation to your follow-up appointment?: No Do you understand care options if your condition(s) worsen?: Yes-patient verbalized understanding  SDOH Interventions Today    Flowsheet Row Most Recent Value  SDOH Interventions   Food Insecurity Interventions Intervention Not Indicated  Transportation Interventions Intervention Not Indicated  [normally drives self,  spouse providing transportation post- recent hospitalization]      TOC Interventions Today    Flowsheet Row Most Recent Value  TOC Interventions   TOC Interventions Discussed/Reviewed TOC Interventions Discussed, S/S of infection  [provided my direct phone number should questions/ concerns/ needs arise post-TOC call today]      Interventions Today    Flowsheet Row Most Recent Value  Chronic Disease   Chronic disease during today's visit Other  [(R) knee infection post- recent TKR]  General Interventions   General Interventions Discussed/Reviewed General Interventions Discussed, Doctor Visits  Doctor Visits Discussed/Reviewed Doctor Visits Discussed, Doctor Visits Reviewed, Specialist  PCP/Specialist Visits Compliance with follow-up visit  Education Interventions   Education Provided Provided Education  Provided Verbal Education On Other, When to see the doctor  Nelda Bucks of home health services]  Nutrition Interventions   Nutrition Discussed/Reviewed Nutrition Discussed, Increaing proteins  Pharmacy  Interventions   Pharmacy Dicussed/Reviewed Pharmacy Topics Discussed  [full medication review completed]      Oneta Rack, RN, BSN, CCRN Alumnus RN CM Care Coordination/ Transition of [place] Management [phone]: direct office

## 2022-11-22 DIAGNOSIS — T8453XA Infection and inflammatory reaction due to internal right knee prosthesis, initial encounter: Secondary | ICD-10-CM | POA: Diagnosis not present

## 2022-11-22 DIAGNOSIS — M00861 Arthritis due to other bacteria, right knee: Secondary | ICD-10-CM | POA: Diagnosis not present

## 2022-11-22 DIAGNOSIS — K219 Gastro-esophageal reflux disease without esophagitis: Secondary | ICD-10-CM | POA: Diagnosis not present

## 2022-11-22 DIAGNOSIS — I10 Essential (primary) hypertension: Secondary | ICD-10-CM | POA: Diagnosis not present

## 2022-11-22 DIAGNOSIS — F32A Depression, unspecified: Secondary | ICD-10-CM | POA: Diagnosis not present

## 2022-11-23 DIAGNOSIS — M009 Pyogenic arthritis, unspecified: Secondary | ICD-10-CM | POA: Diagnosis not present

## 2022-11-26 DIAGNOSIS — M00861 Arthritis due to other bacteria, right knee: Secondary | ICD-10-CM | POA: Diagnosis not present

## 2022-11-26 DIAGNOSIS — T8453XA Infection and inflammatory reaction due to internal right knee prosthesis, initial encounter: Secondary | ICD-10-CM | POA: Diagnosis not present

## 2022-11-26 DIAGNOSIS — I10 Essential (primary) hypertension: Secondary | ICD-10-CM | POA: Diagnosis not present

## 2022-11-26 DIAGNOSIS — K219 Gastro-esophageal reflux disease without esophagitis: Secondary | ICD-10-CM | POA: Diagnosis not present

## 2022-11-26 DIAGNOSIS — F32A Depression, unspecified: Secondary | ICD-10-CM | POA: Diagnosis not present

## 2022-11-28 DIAGNOSIS — M00861 Arthritis due to other bacteria, right knee: Secondary | ICD-10-CM | POA: Diagnosis not present

## 2022-11-28 DIAGNOSIS — F32A Depression, unspecified: Secondary | ICD-10-CM | POA: Diagnosis not present

## 2022-11-28 DIAGNOSIS — K219 Gastro-esophageal reflux disease without esophagitis: Secondary | ICD-10-CM | POA: Diagnosis not present

## 2022-11-28 DIAGNOSIS — I10 Essential (primary) hypertension: Secondary | ICD-10-CM | POA: Diagnosis not present

## 2022-11-28 DIAGNOSIS — T8453XA Infection and inflammatory reaction due to internal right knee prosthesis, initial encounter: Secondary | ICD-10-CM | POA: Diagnosis not present

## 2022-11-29 LAB — LAB REPORT - SCANNED: EGFR: 98

## 2022-12-01 DIAGNOSIS — M009 Pyogenic arthritis, unspecified: Secondary | ICD-10-CM | POA: Diagnosis not present

## 2022-12-01 DIAGNOSIS — T8131XA Disruption of external operation (surgical) wound, not elsewhere classified, initial encounter: Secondary | ICD-10-CM | POA: Diagnosis not present

## 2022-12-05 DIAGNOSIS — M00861 Arthritis due to other bacteria, right knee: Secondary | ICD-10-CM | POA: Diagnosis not present

## 2022-12-05 DIAGNOSIS — T8453XA Infection and inflammatory reaction due to internal right knee prosthesis, initial encounter: Secondary | ICD-10-CM | POA: Diagnosis not present

## 2022-12-05 DIAGNOSIS — I10 Essential (primary) hypertension: Secondary | ICD-10-CM | POA: Diagnosis not present

## 2022-12-05 DIAGNOSIS — F32A Depression, unspecified: Secondary | ICD-10-CM | POA: Diagnosis not present

## 2022-12-05 DIAGNOSIS — K219 Gastro-esophageal reflux disease without esophagitis: Secondary | ICD-10-CM | POA: Diagnosis not present

## 2022-12-06 LAB — LAB REPORT - SCANNED: EGFR: 99

## 2022-12-08 DIAGNOSIS — M009 Pyogenic arthritis, unspecified: Secondary | ICD-10-CM | POA: Diagnosis not present

## 2022-12-12 DIAGNOSIS — I10 Essential (primary) hypertension: Secondary | ICD-10-CM | POA: Diagnosis not present

## 2022-12-12 DIAGNOSIS — T8453XA Infection and inflammatory reaction due to internal right knee prosthesis, initial encounter: Secondary | ICD-10-CM | POA: Diagnosis not present

## 2022-12-12 DIAGNOSIS — F32A Depression, unspecified: Secondary | ICD-10-CM | POA: Diagnosis not present

## 2022-12-12 DIAGNOSIS — K219 Gastro-esophageal reflux disease without esophagitis: Secondary | ICD-10-CM | POA: Diagnosis not present

## 2022-12-12 DIAGNOSIS — M00861 Arthritis due to other bacteria, right knee: Secondary | ICD-10-CM | POA: Diagnosis not present

## 2022-12-14 ENCOUNTER — Encounter: Payer: Self-pay | Admitting: Internal Medicine

## 2022-12-14 ENCOUNTER — Other Ambulatory Visit: Payer: Self-pay

## 2022-12-14 ENCOUNTER — Telehealth: Payer: Self-pay

## 2022-12-14 ENCOUNTER — Ambulatory Visit: Payer: Medicare Other | Admitting: Internal Medicine

## 2022-12-14 VITALS — BP 125/74 | HR 98 | Temp 98.0°F | Wt 229.0 lb

## 2022-12-14 DIAGNOSIS — M009 Pyogenic arthritis, unspecified: Secondary | ICD-10-CM | POA: Diagnosis not present

## 2022-12-14 NOTE — Assessment & Plan Note (Signed)
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  He was planned to complete 6 weeks of IV cefazolin on 3/26, however, I have some concern of stopping early given the superficial irrigation he needed to have done this past Monday in the orthopedic office.  It sounds like this was superficial and not a deeper process.  For now, will plan to extend IV cefazolin x 2 additional weeks through 01/10/23 and follow up at that time for next steps with antibiotics.  We have communicated with home health about antibiotics and continued weekly lab monitoring. I will see him back on April 9th.

## 2022-12-14 NOTE — Telephone Encounter (Signed)
Called [facility] with verbal order to extend patient IV antibiotics through 4/9. Relayed verbal order to Teah who was able to take verbal order.  Leatrice Jewels, RMA

## 2022-12-14 NOTE — Progress Notes (Signed)
[name] for Infectious Disease  CHIEF COMPLAINT:    Follow up for PJI  SUBJECTIVE:    [name] [name] is a 72 y.o. male with PMHx as below who presents to the clinic for right knee PJI.   Patient was admitted at [facility] 2/12-2/16.  He was there for right knee PJI.  He underwent DAIR on 11/15/22 with Dr Theda Sers.  His initial knee replacement was 10/25/22.  His operative cultures grew MSSA.  PICC line was placed and he was seen by Dr Tommy Medal while inpatient.  He was discharged on Ancef 2gm Q8H through 12/27/22 (6 weeks).  He was scheduled for follow up today. He reports no issues with his IV antibiotics.  His OPAT labs from 12/12/22 are notable for WBC 6.8, Hgb 10.9, creatinine 0.91, ESR 35, CRP 11 (0-10 normal).  These inflammatory markers are improved from his admission and his CRP on 12/05/22 was 28.  He has been following with orthopedic surgery.  He was seen in the office on 12/12/22 by Dr Theda Sers.  He reports having what sounds like some purulence expressed superficially by Dr Theda Sers.  This was then irrigated and cleaned out in the office.  They have been doing daily packing since that time.  They report Dr Theda Sers thinks this was surface level.    Please see A&P for the details of today's visit and status of the patient's medical problems.   Patient's Medications  New Prescriptions   No medications on file  Previous Medications   APPLE CIDER VINEGAR PO    Take 1 capsule by mouth daily.   ASPIRIN EC 81 MG TABLET    Take 1 tablet (81 mg total) by mouth 2 (two) times daily. Swallow whole.   CALCIUM CARBONATE (TUMS - DOSED IN MG ELEMENTAL CALCIUM) 500 MG CHEWABLE TABLET    Chew 2 tablets by mouth 2 (two) times daily as needed for indigestion or heartburn.   CEFAZOLIN (ANCEF) IVPB    Inject 2 g into the vein every 8 (eight) hours. Indication:  MSSA prosthetic knee infection First Dose: No Last Day of Therapy:  12/27/2022 Labs - Once weekly:  CBC/D and BMP, Labs - Every  other week:  ESR and CRP Method of administration: IV Push Method of administration may be changed at the discretion of home infusion pharmacist based upon assessment of the patient and/or caregiver's ability to self-administer the medication ordered.   DOCUSATE SODIUM (COLACE) 100 MG CAPSULE    Take 200 mg by mouth 2 (two) times daily.   ERGOCALCIFEROL (VITAMIN D2) 50 MCG (2000 UT) TABS    Take 2,000 Units by mouth daily.   KETOTIFEN FUMARATE (ALLERGY EYE DROPS OP)    Place 1 drop into both eyes daily as needed (red/itchy eyes).   MAGNESIUM 500 MG TABS    Take 500 mg by mouth daily.   METHOCARBAMOL (ROBAXIN) 500 MG TABLET    Take 1 tablet (500 mg total) by mouth 4 (four) times daily.   MULTIPLE VITAMIN (MULTI-VITAMIN PO)    Take 1 tablet by mouth daily.   NAPROXEN (NAPROSYN) 500 MG TABLET    Take 500 mg by mouth 2 (two) times daily.   OXYCODONE (OXY IR/ROXICODONE) 5 MG IMMEDIATE RELEASE TABLET    Take 5-10 mg by mouth every 4 (four) hours as needed for severe pain.   OXYCODONE (ROXICODONE) 5 MG IMMEDIATE RELEASE TABLET    Take 1 tablet (5 mg total) by mouth  every 4 (four) hours as needed for severe pain.   POLYETHYLENE GLYCOL POWDER (GLYCOLAX/MIRALAX) 17 GM/SCOOP POWDER    Take 1 Container by mouth once.   SERTRALINE (ZOLOFT) 100 MG TABLET    TAKE 1 TABLET BY MOUTH EVERY DAY   SODIUM CHLORIDE (OCEAN) 0.65 % SOLN NASAL SPRAY    Place 1 spray into both nostrils as needed for congestion.   TAMSULOSIN (FLOMAX) 0.4 MG CAPS CAPSULE    Take 0.4 mg by mouth daily.   TELMISARTAN (MICARDIS) 80 MG TABLET    TAKE 1 TABLET BY MOUTH EVERY DAY   TRAMADOL (ULTRAM) 50 MG TABLET    Take 50 mg by mouth every 6 (six) hours as needed for moderate pain.   TRAMADOL (ULTRAM) 50 MG TABLET    Take 1 tablet (50 mg total) by mouth every 6 (six) hours as needed.   ZINC 30 MG TABS    Take 30 mg by mouth daily.   ZOLPIDEM (AMBIEN) 5 MG TABLET    Take 1 tablet (5 mg total) by mouth at bedtime as needed for sleep.  Modified  Medications   No medications on file  Discontinued Medications   No medications on file      Past Medical History:  Diagnosis Date   Bronchitis    Depression    GERD (gastroesophageal reflux disease)    Hypertension     Social History   Tobacco Use   Smoking status: Never   Smokeless tobacco: Never  Vaping Use   Vaping Use: Never used  Substance Use Topics   Alcohol use: Yes    Comment: socially   Drug use: No    Family History  Problem Relation Age of Onset   Emphysema Mother        heart failure indusce emphysema   Heart disease Father    Heart attack Father    Heart disease Sister 33       cabgx3    Allergies  Allergen Reactions   Escitalopram Oxalate Other (See Comments)    Anxiety, insomnia   Irbesartan Cough    Review of Systems  Gastrointestinal: Negative.   All other systems reviewed and are negative.    OBJECTIVE:    Vitals:   12/14/22 1459  BP: 125/74  Pulse: 98  Temp: 98 F (36.7 C)  TempSrc: Oral  SpO2: 96%  Weight: 229 lb (103.9 kg)   Body mass index is 33.82 kg/m.  Physical Exam Constitutional:      Appearance: Normal appearance.  HENT:     Head: Normocephalic and atraumatic.  Eyes:     Extraocular Movements: Extraocular movements intact.     Conjunctiva/sclera: Conjunctivae normal.  Pulmonary:     Effort: Pulmonary effort is normal. No respiratory distress.  Abdominal:     General: There is no distension.     Palpations: Abdomen is soft.  Musculoskeletal:     Comments: PICC in place.  Knee in immobilizer.  Neurological:     General: No focal deficit present.     Mental Status: He is alert and oriented to person, place, and time.  Psychiatric:        Mood and Affect: Mood normal.        Behavior: Behavior normal.      Labs and Microbiology:    Latest Ref Rng & Units 11/19/2022    3:22 AM 11/18/2022    3:42 AM 11/17/2022    7:22 AM  CBC  Hemoglobin 13.0 - 17.0 g/dL  11.0  10.8  10.9   Hematocrit 39.0 - 52.0 %  33.4  32.3  32.8       Latest Ref Rng & Units 11/19/2022    3:22 AM 11/18/2022    3:42 AM 11/17/2022    7:22 AM  CMP  Glucose 70 - 99 mg/dL 101  124  127   BUN 8 - 23 mg/dL '18  18  20   '$ Creatinine 0.61 - 1.24 mg/dL 0.77  0.80  0.88   Sodium 135 - 145 mmol/L 134  133  132   Potassium 3.5 - 5.1 mmol/L 4.1  3.9  3.7   Chloride 98 - 111 mmol/L 100  97  95   CO2 22 - 32 mmol/L '25  26  25   '$ Calcium 8.9 - 10.3 mg/dL 8.4  8.5  8.6      No results found for this or any previous visit (from the past 240 hour(s)).    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  He was planned to complete 6 weeks of IV cefazolin on 3/26, however, I have some concern of stopping early given the superficial irrigation he needed to have done this past Monday in the orthopedic office.  It sounds like this was superficial and not a deeper process.  For now, will plan to extend IV cefazolin x 2 additional weeks through 01/10/23 and follow up at that time for next steps with antibiotics.  We have communicated with home health about antibiotics and continued weekly lab monitoring. I will see him back on April 9th.     No orders of the defined types were placed in this encounter.       Raynelle Highland for Infectious Disease [facility] 12/14/2022, 3:34 PM   I have personally spent 30 minutes involved in face-to-face and non-face-to-face activities for this patient on the day of the visit. Professional time spent includes the following activities: Preparing to see the patient (review of tests), Obtaining and/or reviewing separately obtained history (admission/discharge record), Performing a medically appropriate examination and/or evaluation , Ordering medications/tests/procedures, referring and communicating with other health care professionals, Documenting clinical information in the EMR, Independently interpreting results  (not separately reported), Communicating results to the patient/family/caregiver, Counseling and educating the patient/family/caregiver and Care coordination (not separately reported).

## 2022-12-15 DIAGNOSIS — F32A Depression, unspecified: Secondary | ICD-10-CM | POA: Diagnosis not present

## 2022-12-15 DIAGNOSIS — M00861 Arthritis due to other bacteria, right knee: Secondary | ICD-10-CM | POA: Diagnosis not present

## 2022-12-15 DIAGNOSIS — M009 Pyogenic arthritis, unspecified: Secondary | ICD-10-CM | POA: Diagnosis not present

## 2022-12-15 DIAGNOSIS — K219 Gastro-esophageal reflux disease without esophagitis: Secondary | ICD-10-CM | POA: Diagnosis not present

## 2022-12-15 DIAGNOSIS — I10 Essential (primary) hypertension: Secondary | ICD-10-CM | POA: Diagnosis not present

## 2022-12-15 DIAGNOSIS — T8453XA Infection and inflammatory reaction due to internal right knee prosthesis, initial encounter: Secondary | ICD-10-CM | POA: Diagnosis not present

## 2022-12-16 DIAGNOSIS — T8453XA Infection and inflammatory reaction due to internal right knee prosthesis, initial encounter: Secondary | ICD-10-CM | POA: Diagnosis not present

## 2022-12-16 DIAGNOSIS — F32A Depression, unspecified: Secondary | ICD-10-CM | POA: Diagnosis not present

## 2022-12-16 DIAGNOSIS — I10 Essential (primary) hypertension: Secondary | ICD-10-CM | POA: Diagnosis not present

## 2022-12-16 DIAGNOSIS — M00861 Arthritis due to other bacteria, right knee: Secondary | ICD-10-CM | POA: Diagnosis not present

## 2022-12-16 DIAGNOSIS — K219 Gastro-esophageal reflux disease without esophagitis: Secondary | ICD-10-CM | POA: Diagnosis not present

## 2022-12-17 DIAGNOSIS — M00861 Arthritis due to other bacteria, right knee: Secondary | ICD-10-CM | POA: Diagnosis not present

## 2022-12-17 DIAGNOSIS — K219 Gastro-esophageal reflux disease without esophagitis: Secondary | ICD-10-CM | POA: Diagnosis not present

## 2022-12-17 DIAGNOSIS — I10 Essential (primary) hypertension: Secondary | ICD-10-CM | POA: Diagnosis not present

## 2022-12-17 DIAGNOSIS — F32A Depression, unspecified: Secondary | ICD-10-CM | POA: Diagnosis not present

## 2022-12-17 DIAGNOSIS — T8453XA Infection and inflammatory reaction due to internal right knee prosthesis, initial encounter: Secondary | ICD-10-CM | POA: Diagnosis not present

## 2022-12-18 DIAGNOSIS — I10 Essential (primary) hypertension: Secondary | ICD-10-CM | POA: Diagnosis not present

## 2022-12-18 DIAGNOSIS — K219 Gastro-esophageal reflux disease without esophagitis: Secondary | ICD-10-CM | POA: Diagnosis not present

## 2022-12-18 DIAGNOSIS — F32A Depression, unspecified: Secondary | ICD-10-CM | POA: Diagnosis not present

## 2022-12-18 DIAGNOSIS — M00861 Arthritis due to other bacteria, right knee: Secondary | ICD-10-CM | POA: Diagnosis not present

## 2022-12-18 DIAGNOSIS — T8453XA Infection and inflammatory reaction due to internal right knee prosthesis, initial encounter: Secondary | ICD-10-CM | POA: Diagnosis not present

## 2022-12-19 DIAGNOSIS — S81001A Unspecified open wound, right knee, initial encounter: Secondary | ICD-10-CM | POA: Diagnosis not present

## 2022-12-19 DIAGNOSIS — T8131XA Disruption of external operation (surgical) wound, not elsewhere classified, initial encounter: Secondary | ICD-10-CM | POA: Diagnosis not present

## 2022-12-21 DIAGNOSIS — F32A Depression, unspecified: Secondary | ICD-10-CM | POA: Diagnosis not present

## 2022-12-21 DIAGNOSIS — I10 Essential (primary) hypertension: Secondary | ICD-10-CM | POA: Diagnosis not present

## 2022-12-21 DIAGNOSIS — K219 Gastro-esophageal reflux disease without esophagitis: Secondary | ICD-10-CM | POA: Diagnosis not present

## 2022-12-21 DIAGNOSIS — T8453XA Infection and inflammatory reaction due to internal right knee prosthesis, initial encounter: Secondary | ICD-10-CM | POA: Diagnosis not present

## 2022-12-21 DIAGNOSIS — M00861 Arthritis due to other bacteria, right knee: Secondary | ICD-10-CM | POA: Diagnosis not present

## 2022-12-22 DIAGNOSIS — M009 Pyogenic arthritis, unspecified: Secondary | ICD-10-CM | POA: Diagnosis not present

## 2022-12-22 LAB — LAB REPORT - SCANNED: EGFR: 99

## 2022-12-23 DIAGNOSIS — F32A Depression, unspecified: Secondary | ICD-10-CM | POA: Diagnosis not present

## 2022-12-23 DIAGNOSIS — I10 Essential (primary) hypertension: Secondary | ICD-10-CM | POA: Diagnosis not present

## 2022-12-23 DIAGNOSIS — K219 Gastro-esophageal reflux disease without esophagitis: Secondary | ICD-10-CM | POA: Diagnosis not present

## 2022-12-23 DIAGNOSIS — T8453XA Infection and inflammatory reaction due to internal right knee prosthesis, initial encounter: Secondary | ICD-10-CM | POA: Diagnosis not present

## 2022-12-23 DIAGNOSIS — M00861 Arthritis due to other bacteria, right knee: Secondary | ICD-10-CM | POA: Diagnosis not present

## 2022-12-26 DIAGNOSIS — T8453XA Infection and inflammatory reaction due to internal right knee prosthesis, initial encounter: Secondary | ICD-10-CM | POA: Diagnosis not present

## 2022-12-26 DIAGNOSIS — F32A Depression, unspecified: Secondary | ICD-10-CM | POA: Diagnosis not present

## 2022-12-26 DIAGNOSIS — I10 Essential (primary) hypertension: Secondary | ICD-10-CM | POA: Diagnosis not present

## 2022-12-26 DIAGNOSIS — K219 Gastro-esophageal reflux disease without esophagitis: Secondary | ICD-10-CM | POA: Diagnosis not present

## 2022-12-26 DIAGNOSIS — M00861 Arthritis due to other bacteria, right knee: Secondary | ICD-10-CM | POA: Diagnosis not present

## 2022-12-28 DIAGNOSIS — I10 Essential (primary) hypertension: Secondary | ICD-10-CM | POA: Diagnosis not present

## 2022-12-28 DIAGNOSIS — K219 Gastro-esophageal reflux disease without esophagitis: Secondary | ICD-10-CM | POA: Diagnosis not present

## 2022-12-28 DIAGNOSIS — M00861 Arthritis due to other bacteria, right knee: Secondary | ICD-10-CM | POA: Diagnosis not present

## 2022-12-28 DIAGNOSIS — T8453XA Infection and inflammatory reaction due to internal right knee prosthesis, initial encounter: Secondary | ICD-10-CM | POA: Diagnosis not present

## 2022-12-28 DIAGNOSIS — F32A Depression, unspecified: Secondary | ICD-10-CM | POA: Diagnosis not present

## 2022-12-29 DIAGNOSIS — M009 Pyogenic arthritis, unspecified: Secondary | ICD-10-CM | POA: Diagnosis not present

## 2022-12-29 LAB — LAB REPORT - SCANNED: EGFR: 102

## 2023-01-02 DIAGNOSIS — K219 Gastro-esophageal reflux disease without esophagitis: Secondary | ICD-10-CM | POA: Diagnosis not present

## 2023-01-02 DIAGNOSIS — T8453XA Infection and inflammatory reaction due to internal right knee prosthesis, initial encounter: Secondary | ICD-10-CM | POA: Diagnosis not present

## 2023-01-02 DIAGNOSIS — F32A Depression, unspecified: Secondary | ICD-10-CM | POA: Diagnosis not present

## 2023-01-02 DIAGNOSIS — M00861 Arthritis due to other bacteria, right knee: Secondary | ICD-10-CM | POA: Diagnosis not present

## 2023-01-02 DIAGNOSIS — I10 Essential (primary) hypertension: Secondary | ICD-10-CM | POA: Diagnosis not present

## 2023-01-03 LAB — LAB REPORT - SCANNED: EGFR: 97

## 2023-01-04 DIAGNOSIS — I10 Essential (primary) hypertension: Secondary | ICD-10-CM | POA: Diagnosis not present

## 2023-01-04 DIAGNOSIS — K219 Gastro-esophageal reflux disease without esophagitis: Secondary | ICD-10-CM | POA: Diagnosis not present

## 2023-01-04 DIAGNOSIS — M00861 Arthritis due to other bacteria, right knee: Secondary | ICD-10-CM | POA: Diagnosis not present

## 2023-01-04 DIAGNOSIS — F32A Depression, unspecified: Secondary | ICD-10-CM | POA: Diagnosis not present

## 2023-01-04 DIAGNOSIS — T8453XA Infection and inflammatory reaction due to internal right knee prosthesis, initial encounter: Secondary | ICD-10-CM | POA: Diagnosis not present

## 2023-01-05 DIAGNOSIS — M009 Pyogenic arthritis, unspecified: Secondary | ICD-10-CM | POA: Diagnosis not present

## 2023-01-06 DIAGNOSIS — K219 Gastro-esophageal reflux disease without esophagitis: Secondary | ICD-10-CM | POA: Diagnosis not present

## 2023-01-06 DIAGNOSIS — I10 Essential (primary) hypertension: Secondary | ICD-10-CM | POA: Diagnosis not present

## 2023-01-06 DIAGNOSIS — M00861 Arthritis due to other bacteria, right knee: Secondary | ICD-10-CM | POA: Diagnosis not present

## 2023-01-06 DIAGNOSIS — T8453XA Infection and inflammatory reaction due to internal right knee prosthesis, initial encounter: Secondary | ICD-10-CM | POA: Diagnosis not present

## 2023-01-06 DIAGNOSIS — F32A Depression, unspecified: Secondary | ICD-10-CM | POA: Diagnosis not present

## 2023-01-09 DIAGNOSIS — T8453XA Infection and inflammatory reaction due to internal right knee prosthesis, initial encounter: Secondary | ICD-10-CM | POA: Diagnosis not present

## 2023-01-09 DIAGNOSIS — Z4789 Encounter for other orthopedic aftercare: Secondary | ICD-10-CM | POA: Diagnosis not present

## 2023-01-09 DIAGNOSIS — K219 Gastro-esophageal reflux disease without esophagitis: Secondary | ICD-10-CM | POA: Diagnosis not present

## 2023-01-09 DIAGNOSIS — M00861 Arthritis due to other bacteria, right knee: Secondary | ICD-10-CM | POA: Diagnosis not present

## 2023-01-09 DIAGNOSIS — I10 Essential (primary) hypertension: Secondary | ICD-10-CM | POA: Diagnosis not present

## 2023-01-09 DIAGNOSIS — F32A Depression, unspecified: Secondary | ICD-10-CM | POA: Diagnosis not present

## 2023-01-10 ENCOUNTER — Other Ambulatory Visit: Payer: Self-pay

## 2023-01-10 ENCOUNTER — Encounter: Payer: Self-pay | Admitting: Internal Medicine

## 2023-01-10 ENCOUNTER — Ambulatory Visit: Payer: Medicare Other | Admitting: Internal Medicine

## 2023-01-10 VITALS — BP 107/71 | HR 92 | Temp 98.2°F | Ht 69.0 in | Wt 233.0 lb

## 2023-01-10 DIAGNOSIS — M009 Pyogenic arthritis, unspecified: Secondary | ICD-10-CM | POA: Diagnosis not present

## 2023-01-10 LAB — LAB REPORT - SCANNED: EGFR: 99

## 2023-01-10 MED ORDER — CEFADROXIL 500 MG PO CAPS: 500.0000 mg | ORAL_CAPSULE | Freq: Two times a day (BID) | ORAL | 2 refills | Status: DC

## 2023-01-10 NOTE — Progress Notes (Signed)
[facility]  CHIEF COMPLAINT:    Follow up for PJI  SUBJECTIVE:    [name] [name] is a 72 y.o. male with PMHx as below who presents to the clinic for right knee PJI.   Patient was admitted in February 2024 at [facility] for right knee PJI.  He underwent DAIR on 11/15/22 with Dr Thomasena Edisollins. His initial knee replacement was 10/25/22. His operative cultures grew MSSA. PICC line was placed and he was seen by Dr Daiva EvesVan Dam while inpatient. He was discharged on Ancef 2gm Q8H through 12/27/22.  He was seen by myself for follow up on 12/14/22 and his OPAT orders were extended for 2 weeks through 01/10/23 due to a superficial debridement he had done in Dr Thomasena Edisollins' office on 12/12/22.  Patient had another follow up on 12/19/22 and 12/30/22.  The most recent note indicates that he was continuing to improve.  His OPAT labs show:  3/20 - ESR 23, CRP 7 (both normal) 3/25 - CRP 6 (normal) 4/8 - ESR 6, CRP 4 (both normal) .    Please see A&P for the details of today's visit and status of the patient's medical problems.   Patient's Medications  New Prescriptions   CEFADROXIL (DURICEF) 500 MG CAPSULE    Take 1 capsule (500 mg total) by mouth 2 (two) times daily.  Previous Medications   APPLE CIDER VINEGAR PO    Take 1 capsule by mouth daily.   CALCIUM CARBONATE (TUMS - DOSED IN MG ELEMENTAL CALCIUM) 500 MG CHEWABLE TABLET    Chew 2 tablets by mouth 2 (two) times daily as needed for indigestion or heartburn.   DOCUSATE SODIUM (COLACE) 100 MG CAPSULE    Take 200 mg by mouth 2 (two) times daily.   ERGOCALCIFEROL (VITAMIN D2) 50 MCG (2000 UT) TABS    Take 2,000 Units by mouth daily.   KETOTIFEN FUMARATE (ALLERGY EYE DROPS OP)    Place 1 drop into both eyes daily as needed (red/itchy eyes).   MAGNESIUM 500 MG TABS    Take 500 mg by mouth daily.   METHOCARBAMOL (ROBAXIN) 500 MG TABLET    Take 1 tablet (500 mg total) by mouth 4 (four) times daily.   MULTIPLE VITAMIN (MULTI-VITAMIN  PO)    Take 1 tablet by mouth daily.   NAPROXEN (NAPROSYN) 500 MG TABLET    Take 500 mg by mouth 2 (two) times daily.   OXYCODONE (OXY IR/ROXICODONE) 5 MG IMMEDIATE RELEASE TABLET    Take 5-10 mg by mouth every 4 (four) hours as needed for severe pain.   POLYETHYLENE GLYCOL POWDER (GLYCOLAX/MIRALAX) 17 GM/SCOOP POWDER    Take 1 Container by mouth once.   SERTRALINE (ZOLOFT) 100 MG TABLET    TAKE 1 TABLET BY MOUTH EVERY DAY   SODIUM CHLORIDE (OCEAN) 0.65 % SOLN NASAL SPRAY    Place 1 spray into both nostrils as needed for congestion.   TAMSULOSIN (FLOMAX) 0.4 MG CAPS CAPSULE    Take 0.4 mg by mouth daily.   TELMISARTAN (MICARDIS) 80 MG TABLET    TAKE 1 TABLET BY MOUTH EVERY DAY   TRAMADOL (ULTRAM) 50 MG TABLET    Take 50 mg by mouth every 6 (six) hours as needed for moderate pain.   TRAMADOL (ULTRAM) 50 MG TABLET    Take 1 tablet (50 mg total) by mouth every 6 (six) hours as needed.   ZINC 30 MG TABS    Take  30 mg by mouth daily.   ZOLPIDEM (AMBIEN) 5 MG TABLET    Take 1 tablet (5 mg total) by mouth at bedtime as needed for sleep.  Modified Medications   No medications on file  Discontinued Medications   OXYCODONE (ROXICODONE) 5 MG IMMEDIATE RELEASE TABLET    Take 1 tablet (5 mg total) by mouth every 4 (four) hours as needed for severe pain.      Past Medical History:  Diagnosis Date   Bronchitis    Depression    GERD (gastroesophageal reflux disease)    Hypertension     Social History   Tobacco Use   Smoking status: Never   Smokeless tobacco: Never  Vaping Use   Vaping Use: Never used  Substance Use Topics   Alcohol use: Yes    Comment: socially   Drug use: No    Family History  Problem Relation Age of Onset   Emphysema Mother        heart failure indusce emphysema   Heart disease Father    Heart attack Father    Heart disease Sister 73       cabgx3    Allergies  Allergen Reactions   Escitalopram Oxalate Other (See Comments)    Anxiety, insomnia   Irbesartan Cough     Review of Systems  Constitutional: Negative.   Gastrointestinal: Negative.   All other systems reviewed and are negative.    OBJECTIVE:    Vitals:   01/10/23 1432  BP: 117/73  Pulse: 82  Temp: 98.2 F (36.8 C)  TempSrc: Temporal  SpO2: 99%  Weight: 233 lb (105.7 kg)  Height: 5\' 9"  (1.753 m)   Body mass index is 34.41 kg/m.  Physical Exam Constitutional:      Appearance: Normal appearance.  Pulmonary:     Effort: Pulmonary effort is normal. No respiratory distress.  Musculoskeletal:     Cervical back: Normal range of motion and neck supple.     Comments: Right knee incision healing with small scabbed area at proximal part of incision.  No drainage.   Skin:    General: Skin is warm and dry.  Neurological:     General: No focal deficit present.     Mental Status: He is alert and oriented to person, place, and time.  Psychiatric:        Mood and Affect: Mood normal.        Behavior: Behavior normal.      Labs and Microbiology:    Latest Ref Rng & Units 11/19/2022    3:22 AM 11/18/2022    3:42 AM 11/17/2022    7:22 AM  CBC  Hemoglobin 13.0 - 17.0 g/dL 27.7  82.4  23.5   Hematocrit 39.0 - 52.0 % 33.4  32.3  32.8       Latest Ref Rng & Units 11/19/2022    3:22 AM 11/18/2022    3:42 AM 11/17/2022    7:22 AM  CMP  Glucose 70 - 99 mg/dL 361  443  154   BUN 8 - 23 mg/dL 18  18  20    Creatinine 0.61 - 1.24 mg/dL 0.08  6.76  1.95   Sodium 135 - 145 mmol/L 134  133  132   Potassium 3.5 - 5.1 mmol/L 4.1  3.9  3.7   Chloride 98 - 111 mmol/L 100  97  95   CO2 22 - 32 mmol/L 25  26  25    Calcium 8.9 - 10.3 mg/dL 8.4  8.5  8.6      No results found for this or any previous visit (from the past 240 hour(s)).    ASSESSMENT & PLAN:    Infection of right knee [facility]) Patient following up today for right knee PJI secondary to MSSA status post DAIR 11/15/22.  Doing well on Cefazolin with normalized inflammatory markers and he has now completed 8 weeks of IV antibiotics  (initial course extended due to superficial debridement 12/12/22).  Will go ahead and place on suppressive Cefadroxil 500mg  BID at this time and have PICC line removed.  Follow up in 6 weeks.  Discussed with patient plan for at least 6 months of suppressive antibiotics and potentially longer.     Vedia Coffer for Infectious Disease [facility] 01/10/2023, 2:47 PM  I have personally spent 30 minutes involved in face-to-face and non-face-to-face activities for this patient on the day of the visit. Professional time spent includes the following activities: Preparing to see the patient (review of tests), Obtaining and/or reviewing separately obtained history (admission/discharge record), Performing a medically appropriate examination and/or evaluation , Ordering medications/tests/procedures, referring and communicating with other health care professionals, Documenting clinical information in the EMR, Independently interpreting results (not separately reported), Communicating results to the patient/family/caregiver, Counseling and educating the patient/family/caregiver and Care coordination (not separately reported).

## 2023-01-10 NOTE — Assessment & Plan Note (Signed)
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  Doing well on Cefazolin with normalized inflammatory markers and he has now completed 8 weeks of IV antibiotics (initial course extended due to superficial debridement 12/12/22).  Will go ahead and place on suppressive Cefadroxil 500mg  BID at this time and have PICC line removed.  Follow up in 6 weeks.  Discussed with patient plan for at least 6 months of suppressive antibiotics and potentially longer.

## 2023-01-10 NOTE — Progress Notes (Signed)
PICC Removal    PICC length & location:  right brachial 40 cm  Removed per verbal order from: Dr. Earlene Plater   Blood thinners:  none Platelet count:  207 ([facility] 4/8)  Site assessment: Dressing clean and dry. Extremity warm and dry. No redness, drainage, or swelling present at insertion site.   Pre-removal vital signs:  BP:  117/73 HR:  82 SpO2:  99% room air   Insertion site positioned below level of heart. No sutures present. Insertion site cleaned with CHG, catheter removed and petroleum dressing applied. Tip intact. Pressure held until hemostasis achieved.    Length of catheter removed:  40 cm   Provided patient with after care instructions and precautions print out (via [employer] Clinical Key). Reviewed this information with patient.   Patient verbalized understanding and agreement, all questions answered. Patient tolerated procedure well and remained in clinic under the care of RN 30 minutes post removal.  Post-observation vital signs:  BP:  107/71 HR:  92 SpO2:  95  Notified Jeri Modena, RN with [facility] and [facility] pharmacy team of removal.  Sandie Ano, RN

## 2023-01-10 NOTE — Patient Instructions (Signed)
Thank you for coming to see me today. It was a pleasure seeing you.  To Do: Stop IV antibiotics and pull PICC line Start Cefadroxil oral antibiotic 500mg  twice daily Follow up in 6 weeks  If you have any questions or concerns, please do not hesitate to call the office at [phone].  Take Care,   Gwynn Burly

## 2023-01-11 DIAGNOSIS — M25661 Stiffness of right knee, not elsewhere classified: Secondary | ICD-10-CM | POA: Diagnosis not present

## 2023-01-11 DIAGNOSIS — M25561 Pain in right knee: Secondary | ICD-10-CM | POA: Diagnosis not present

## 2023-01-17 DIAGNOSIS — H2513 Age-related nuclear cataract, bilateral: Secondary | ICD-10-CM | POA: Diagnosis not present

## 2023-01-18 DIAGNOSIS — M25661 Stiffness of right knee, not elsewhere classified: Secondary | ICD-10-CM | POA: Diagnosis not present

## 2023-01-18 DIAGNOSIS — M25561 Pain in right knee: Secondary | ICD-10-CM | POA: Diagnosis not present

## 2023-01-24 DIAGNOSIS — M25661 Stiffness of right knee, not elsewhere classified: Secondary | ICD-10-CM | POA: Diagnosis not present

## 2023-01-27 DIAGNOSIS — M25661 Stiffness of right knee, not elsewhere classified: Secondary | ICD-10-CM | POA: Diagnosis not present

## 2023-01-27 DIAGNOSIS — M25561 Pain in right knee: Secondary | ICD-10-CM | POA: Diagnosis not present

## 2023-02-10 DIAGNOSIS — Z4789 Encounter for other orthopedic aftercare: Secondary | ICD-10-CM | POA: Diagnosis not present

## 2023-02-13 DIAGNOSIS — T8453XD Infection and inflammatory reaction due to internal right knee prosthesis, subsequent encounter: Secondary | ICD-10-CM | POA: Diagnosis not present

## 2023-02-13 DIAGNOSIS — Z96651 Presence of right artificial knee joint: Secondary | ICD-10-CM | POA: Diagnosis not present

## 2023-02-14 ENCOUNTER — Telehealth: Payer: Self-pay | Admitting: Internal Medicine

## 2023-02-14 NOTE — Telephone Encounter (Signed)
[name] [name] called to cancel his appointment with plans to reschedule after his operation. He wanted Dr. Earlene Plater to know his blood markers are elevated to 59 and the infection is back. He will be gong through a 2-step procedure conducted by Dr. Durene Romans of [facility].

## 2023-02-16 ENCOUNTER — Ambulatory Visit: Payer: Medicare Other | Admitting: Internal Medicine

## 2023-02-17 ENCOUNTER — Telehealth: Payer: Self-pay | Admitting: Internal Medicine

## 2023-02-17 NOTE — Patient Instructions (Signed)
SURGICAL WAITING ROOM VISITATION Patients having surgery or a procedure may have no more than 2 support people in the waiting area - these visitors may rotate.    Children under the age of 56 must have an adult with them who is not the patient.  If the patient needs to stay at the hospital during part of their recovery, the visitor guidelines for inpatient rooms apply. Pre-op nurse will coordinate an appropriate time for 1 support person to accompany patient in pre-op.  This support person may not rotate.    Please refer to the [facility] website for the visitor guidelines for Inpatients (after your surgery is over and you are in a regular room).       Your procedure is scheduled on: 02-21-23   Report to [facility]    Report to admitting at 12:00 PM   Call this number if you have problems the morning of surgery [phone]   Do not eat food :After Midnight.   After Midnight you may have the following liquids until 11:00 AM DAY OF SURGERY  Water Non-Citrus Juices (without pulp, NO RED-Apple, White grape, White cranberry) Black Coffee (NO MILK/CREAM OR CREAMERS, sugar ok)  Clear Tea (NO MILK/CREAM OR CREAMERS, sugar ok) regular and decaf                             Plain Jell-O (NO RED)                                           Fruit ices (not with fruit pulp, NO RED)                                     Popsicles (NO RED)                                                               Sports drinks like Gatorade (NO RED)                   The day of surgery:  Drink ONE (1) Pre-Surgery Clear Ensure at 11:00 AM the morning of surgery. Drink in one sitting. Do not sip.  This drink was given to you during your hospital  pre-op appointment visit. Nothing else to drink after completing the Pre-Surgery Clear Ensure          If you have questions, please contact your surgeon's office.   FOLLOW  ANY ADDITIONAL PRE OP INSTRUCTIONS YOU RECEIVED FROM YOUR  SURGEON'S OFFICE!!!     Oral Hygiene is also important to reduce your risk of infection.                                    Remember - BRUSH YOUR TEETH THE MORNING OF SURGERY WITH YOUR REGULAR TOOTHPASTE   Do NOT smoke after Midnight   Take these medicines the morning of surgery with A SIP OF WATER:   Cefadroxil  Oxycodone  Sertraline  Tramadol                              You may not have any metal on your body including jewelry, and body piercing             Do not wear lotions, powders, cologne, or deodorant              Men may shave face and neck.   Do not bring valuables to the hospital. [facility] IS NOT RESPONSIBLE   FOR VALUABLES.   Contacts, dentures or bridgework may not be worn into surgery.   Bring small overnight bag day of surgery.   DO NOT BRING YOUR HOME MEDICATIONS TO THE HOSPITAL. PHARMACY WILL DISPENSE MEDICATIONS LISTED ON YOUR MEDICATION LIST TO YOU DURING YOUR ADMISSION IN THE HOSPITAL!               Please read over the following fact sheets you were given: IF YOU HAVE QUESTIONS ABOUT YOUR PRE-OP INSTRUCTIONS PLEASE CALL [phone] [name] [name]  If you received a COVID test during your pre-op visit  it is requested that you wear a mask when out in public, stay away from anyone that may not be feeling well and notify your surgeon if you develop symptoms. If you test positive for Covid or have been in contact with anyone that has tested positive in the last 10 days please notify you surgeon.  [facility] - Preparing for Surgery Before surgery, you can play an important role.  Because skin is not sterile, your skin needs to be as free of germs as possible.  You can reduce the number of germs on your skin by washing with CHG (chlorahexidine gluconate) soap before surgery.  CHG is an antiseptic cleaner which kills germs and bonds with the skin to continue killing germs even after washing. Please DO NOT use if you have an allergy to CHG or antibacterial soaps.  If your  skin becomes reddened/irritated stop using the CHG and inform your nurse when you arrive at Short Stay. Do not shave (including legs and underarms) for at least 48 hours prior to the first CHG shower.  You may shave your face/neck.  Please follow these instructions carefully:  1.  Shower with CHG Soap the night before surgery and the  morning of surgery.  2.  If you choose to wash your hair, wash your hair first as usual with your normal  shampoo.  3.  After you shampoo, rinse your hair and body thoroughly to remove the shampoo.                             4.  Use CHG as you would any other liquid soap.  You can apply chg directly to the skin and wash.  Gently with a scrungie or clean washcloth.  5.  Apply the CHG Soap to your body ONLY FROM THE NECK DOWN.   Do   not use on face/ open                           Wound or open sores. Avoid contact with eyes, ears mouth and   genitals (private parts).                       Wash face,  Genitals (private parts) with  your normal soap.             6.  Wash thoroughly, paying special attention to the area where your    surgery  will be performed.  7.  Thoroughly rinse your body with warm water from the neck down.  8.  DO NOT shower/wash with your normal soap after using and rinsing off the CHG Soap.                9.  Pat yourself dry with a clean towel.            10.  Wear clean pajamas.            11.  Place clean sheets on your bed the night of your first shower and do not  sleep with pets. Day of Surgery : Do not apply any lotions/deodorants the morning of surgery.  Please wear clean clothes to the hospital/surgery center.  FAILURE TO FOLLOW THESE INSTRUCTIONS MAY RESULT IN THE CANCELLATION OF YOUR SURGERY  PATIENT SIGNATURE_________________________________  NURSE SIGNATURE__________________________________  ________________________________________________________________________      [name] [name]  An incentive spirometer is a  tool that can help keep your lungs clear and active. This tool measures how well you are filling your lungs with each breath. Taking long deep breaths may help reverse or decrease the chance of developing breathing (pulmonary) problems (especially infection) following: A long period of time when you are unable to move or be active. BEFORE THE PROCEDURE  If the spirometer includes an indicator to show your best effort, your nurse or respiratory therapist will set it to a desired goal. If possible, sit up straight or lean slightly forward. Try not to slouch. Hold the incentive spirometer in an upright position. INSTRUCTIONS FOR USE  Sit on the edge of your bed if possible, or sit up as far as you can in bed or on a chair. Hold the incentive spirometer in an upright position. Breathe out normally. Place the mouthpiece in your mouth and seal your lips tightly around it. Breathe in slowly and as deeply as possible, raising the piston or the ball toward the top of the column. Hold your breath for 3-5 seconds or for as long as possible. Allow the piston or ball to fall to the bottom of the column. Remove the mouthpiece from your mouth and breathe out normally. Rest for a few seconds and repeat Steps 1 through 7 at least 10 times every 1-2 hours when you are awake. Take your time and take a few normal breaths between deep breaths. The spirometer may include an indicator to show your best effort. Use the indicator as a goal to work toward during each repetition. After each set of 10 deep breaths, practice coughing to be sure your lungs are clear. If you have an incision (the cut made at the time of surgery), support your incision when coughing by placing a pillow or rolled up towels firmly against it. Once you are able to get out of bed, walk around indoors and cough well. You may stop using the incentive spirometer when instructed by your caregiver.  RISKS AND COMPLICATIONS Take your time so you do not  get dizzy or light-headed. If you are in pain, you may need to take or ask for pain medication before doing incentive spirometry. It is harder to take a deep breath if you are having pain. AFTER USE Rest and breathe slowly and easily. It can be helpful to keep track of  a log of your progress. Your caregiver can provide you with a simple table to help with this. If you are using the spirometer at home, follow these instructions: SEEK MEDICAL CARE IF:  You are having difficultly using the spirometer. You have trouble using the spirometer as often as instructed. Your pain medication is not giving enough relief while using the spirometer. You develop fever of 100.5 F (38.1 C) or higher. SEEK IMMEDIATE MEDICAL CARE IF:  You cough up bloody sputum that had not been present before. You develop fever of 102 F (38.9 C) or greater. You develop worsening pain at or near the incision site. MAKE SURE YOU:  Understand these instructions. Will watch your condition. Will get help right away if you are not doing well or get worse. Document Released: 01/30/2007 Document Revised: 12/12/2011 Document Reviewed: 04/02/2007 [facility] Patient Information 2014 [place], [place].   ________________________________________________________________________

## 2023-02-17 NOTE — Telephone Encounter (Signed)
We have received Surgical Clearance, to be filled out by provider. Patient requested to send it via Fax within 7-days. Document is located in providers tray at front office.Please advise at Mobile [phone] (mobile)   Please fax to: [phone]

## 2023-02-17 NOTE — Progress Notes (Signed)
COVID Vaccine Completed:  Yes  Date of COVID positive in last 90 days:  PCP - Hillard Danker, MD Cardiologist - Chilton Si, MD (last saw 2016 for hyperlipidemia)  Chest x-ray - 08-08-22 Epic EKG - 07-14-22 Epic Stress Test -  ECHO -  Cardiac Cath -  Pacemaker/ICD device last checked: Spinal Cord Stimulator:  Bowel Prep -   Sleep Study -  CPAP -   Fasting Blood Sugar -  Checks Blood Sugar _____ times a day  Last dose of GLP1 agonist-  N/A GLP1 instructions:  N/A   Last dose of SGLT-2 inhibitors-  N/A SGLT-2 instructions: N/A   Blood Thinner Instructions:  Time Aspirin Instructions: Last Dose:  Activity level:  Can go up a flight of stairs and perform activities of daily living without stopping and without symptoms of chest pain or shortness of breath.  Able to exercise without symptoms  Unable to go up a flight of stairs without symptoms of     Anesthesia review:   Patient denies shortness of breath, fever, cough and chest pain at PAT appointment  Patient verbalized understanding of instructions that were given to them at the PAT appointment. Patient was also instructed that they will need to review over the PAT instructions again at home before surgery.

## 2023-02-17 NOTE — Telephone Encounter (Signed)
Placed inside office box 

## 2023-02-20 ENCOUNTER — Encounter (HOSPITAL_COMMUNITY)
Admission: RE | Admit: 2023-02-20 | Discharge: 2023-02-20 | Disposition: A | Discharge status: Home / Self Care | Payer: Medicare Other | Source: Ambulatory Visit | Attending: Orthopedic Surgery | Admitting: Orthopedic Surgery

## 2023-02-20 ENCOUNTER — Other Ambulatory Visit: Payer: Self-pay

## 2023-02-20 ENCOUNTER — Encounter (HOSPITAL_COMMUNITY): Payer: Self-pay

## 2023-02-20 VITALS — BP 127/79 | HR 67 | Temp 98.5°F | Resp 20 | Ht 69.0 in | Wt 230.8 lb

## 2023-02-20 DIAGNOSIS — I10 Essential (primary) hypertension: Secondary | ICD-10-CM | POA: Insufficient documentation

## 2023-02-20 DIAGNOSIS — Z01812 Encounter for preprocedural laboratory examination: Secondary | ICD-10-CM | POA: Insufficient documentation

## 2023-02-20 DIAGNOSIS — K219 Gastro-esophageal reflux disease without esophagitis: Secondary | ICD-10-CM | POA: Diagnosis not present

## 2023-02-20 DIAGNOSIS — Z01818 Encounter for other preprocedural examination: Secondary | ICD-10-CM

## 2023-02-20 DIAGNOSIS — Z825 Family history of asthma and other chronic lower respiratory diseases: Secondary | ICD-10-CM | POA: Diagnosis not present

## 2023-02-20 DIAGNOSIS — D849 Immunodeficiency, unspecified: Secondary | ICD-10-CM | POA: Diagnosis not present

## 2023-02-20 DIAGNOSIS — T8453XA Infection and inflammatory reaction due to internal right knee prosthesis, initial encounter: Secondary | ICD-10-CM | POA: Diagnosis not present

## 2023-02-20 DIAGNOSIS — Z8249 Family history of ischemic heart disease and other diseases of the circulatory system: Secondary | ICD-10-CM | POA: Diagnosis not present

## 2023-02-20 DIAGNOSIS — Z888 Allergy status to other drugs, medicaments and biological substances status: Secondary | ICD-10-CM | POA: Diagnosis not present

## 2023-02-20 DIAGNOSIS — E1165 Type 2 diabetes mellitus with hyperglycemia: Secondary | ICD-10-CM | POA: Diagnosis not present

## 2023-02-20 DIAGNOSIS — Z85828 Personal history of other malignant neoplasm of skin: Secondary | ICD-10-CM | POA: Diagnosis not present

## 2023-02-20 DIAGNOSIS — Z880 Allergy status to penicillin: Secondary | ICD-10-CM | POA: Diagnosis not present

## 2023-02-20 DIAGNOSIS — Z79899 Other long term (current) drug therapy: Secondary | ICD-10-CM | POA: Diagnosis not present

## 2023-02-20 HISTORY — DX: Unspecified malignant neoplasm of skin, unspecified: C44.90

## 2023-02-20 HISTORY — DX: Unspecified osteoarthritis, unspecified site: M19.90

## 2023-02-20 LAB — BASIC METABOLIC PANEL
Anion gap: 8 (ref 5–15)
BUN: 20 mg/dL (ref 8–23)
CO2: 25 mmol/L (ref 22–32)
Calcium: 9.3 mg/dL (ref 8.9–10.3)
Chloride: 105 mmol/L (ref 98–111)
Creatinine, Ser: 0.76 mg/dL (ref 0.61–1.24)
GFR, Estimated: >60 mL/min (ref >60)
Glucose, Bld: 97 mg/dL (ref 70–99)
Potassium: 4.1 mmol/L (ref 3.5–5.1)
Sodium: 138 mmol/L (ref 135–145)

## 2023-02-20 LAB — CBC
HCT: 41.6 % (ref 39.0–52.0)
Hemoglobin: 13.1 g/dL (ref 13.0–17.0)
MCH: 27.2 pg (ref 26.0–34.0)
MCHC: 31.5 g/dL (ref 30.0–36.0)
MCV: 86.5 fL (ref 80.0–100.0)
Platelets: 199 10*3/uL (ref 150–400)
RBC: 4.81 MIL/uL (ref 4.22–5.81)
RDW: 15.1 % (ref 11.5–15.5)
WBC: 4.3 10*3/uL (ref 4.0–10.5)
nRBC: 0 % (ref 0.0–0.2)

## 2023-02-21 ENCOUNTER — Inpatient Hospital Stay (HOSPITAL_COMMUNITY): Payer: Medicare Other | Admitting: Certified Registered"

## 2023-02-21 ENCOUNTER — Other Ambulatory Visit: Payer: Self-pay

## 2023-02-21 ENCOUNTER — Encounter (HOSPITAL_COMMUNITY)
Admission: RE | Disposition: A | Discharge status: Home / Self Care | Payer: Self-pay | Source: Home / Self Care | Attending: Orthopedic Surgery

## 2023-02-21 ENCOUNTER — Encounter (HOSPITAL_COMMUNITY): Payer: Self-pay | Admitting: Orthopedic Surgery

## 2023-02-21 ENCOUNTER — Inpatient Hospital Stay (HOSPITAL_COMMUNITY)
Admission: RE | Admit: 2023-02-21 | Discharge: 2023-02-25 | DRG: 467 | Disposition: A | Discharge status: Home Health | Payer: Medicare Other | Attending: Orthopedic Surgery | Admitting: Orthopedic Surgery

## 2023-02-21 DIAGNOSIS — T8453XA Infection and inflammatory reaction due to internal right knee prosthesis, initial encounter: Principal | ICD-10-CM | POA: Diagnosis present

## 2023-02-21 DIAGNOSIS — Z880 Allergy status to penicillin: Secondary | ICD-10-CM | POA: Diagnosis not present

## 2023-02-21 DIAGNOSIS — Z825 Family history of asthma and other chronic lower respiratory diseases: Secondary | ICD-10-CM | POA: Diagnosis not present

## 2023-02-21 DIAGNOSIS — I10 Essential (primary) hypertension: Secondary | ICD-10-CM

## 2023-02-21 DIAGNOSIS — Z85828 Personal history of other malignant neoplasm of skin: Secondary | ICD-10-CM | POA: Diagnosis not present

## 2023-02-21 DIAGNOSIS — K219 Gastro-esophageal reflux disease without esophagitis: Secondary | ICD-10-CM | POA: Diagnosis present

## 2023-02-21 DIAGNOSIS — Z79899 Other long term (current) drug therapy: Secondary | ICD-10-CM | POA: Diagnosis not present

## 2023-02-21 DIAGNOSIS — T8453XD Infection and inflammatory reaction due to internal right knee prosthesis, subsequent encounter: Secondary | ICD-10-CM

## 2023-02-21 DIAGNOSIS — E1165 Type 2 diabetes mellitus with hyperglycemia: Secondary | ICD-10-CM | POA: Diagnosis present

## 2023-02-21 DIAGNOSIS — N400 Enlarged prostate without lower urinary tract symptoms: Secondary | ICD-10-CM | POA: Diagnosis present

## 2023-02-21 DIAGNOSIS — D849 Immunodeficiency, unspecified: Secondary | ICD-10-CM | POA: Diagnosis present

## 2023-02-21 DIAGNOSIS — Y831 Surgical operation with implant of artificial internal device as the cause of abnormal reaction of the patient, or of later complication, without mention of misadventure at the time of the procedure: Secondary | ICD-10-CM | POA: Diagnosis present

## 2023-02-21 DIAGNOSIS — Z888 Allergy status to other drugs, medicaments and biological substances status: Secondary | ICD-10-CM

## 2023-02-21 DIAGNOSIS — Z8249 Family history of ischemic heart disease and other diseases of the circulatory system: Secondary | ICD-10-CM | POA: Diagnosis not present

## 2023-02-21 DIAGNOSIS — Z96651 Presence of right artificial knee joint: Secondary | ICD-10-CM | POA: Diagnosis not present

## 2023-02-21 DIAGNOSIS — G8918 Other acute postprocedural pain: Secondary | ICD-10-CM | POA: Diagnosis not present

## 2023-02-21 DIAGNOSIS — Z01818 Encounter for other preprocedural examination: Secondary | ICD-10-CM

## 2023-02-21 HISTORY — PX: EXCISIONAL TOTAL KNEE ARTHROPLASTY WITH ANTIBIOTIC SPACERS: SHX5827

## 2023-02-21 SURGERY — REMOVAL, TOTAL ARTHROPLASTY HARDWARE, KNEE, WITH ANTIBIOTIC SPACER INSERTION
Anesthesia: Monitor Anesthesia Care | Site: Knee | Laterality: Right

## 2023-02-21 MED ORDER — VANCOMYCIN HCL 1000 MG IV SOLR
INTRAVENOUS | Status: DC | PRN
  Administered 2023-02-21: 8000 mg

## 2023-02-21 MED ORDER — ONDANSETRON HCL 4 MG/2ML IJ SOLN: 4.0000 mg | Freq: Four times a day (QID) | INTRAMUSCULAR | Status: DC | PRN

## 2023-02-21 MED ORDER — STERILE WATER FOR IRRIGATION IR SOLN
Status: DC | PRN
  Administered 2023-02-21: 2000 mL

## 2023-02-21 MED ORDER — ACETAMINOPHEN 325 MG PO TABS
325.0000 mg | ORAL_TABLET | Freq: Four times a day (QID) | ORAL | Status: DC | PRN
  Administered 2023-02-25: 650 mg via ORAL
  Filled 2023-02-21: qty 2

## 2023-02-21 MED ORDER — SODIUM CHLORIDE 0.9 % IR SOLN
Status: DC | PRN
  Administered 2023-02-21: 3000 mL

## 2023-02-21 MED ORDER — 0.9 % SODIUM CHLORIDE (POUR BTL) OPTIME
TOPICAL | Status: DC | PRN
  Administered 2023-02-21: 1000 mL

## 2023-02-21 MED ORDER — SERTRALINE HCL 100 MG PO TABS
100.0000 mg | ORAL_TABLET | Freq: Every day | ORAL | Status: DC
  Administered 2023-02-21 – 2023-02-25 (×5): 100 mg via ORAL
  Filled 2023-02-21 (×5): qty 1

## 2023-02-21 MED ORDER — HYDROMORPHONE HCL 1 MG/ML IJ SOLN
0.5000 mg | INTRAMUSCULAR | Status: DC | PRN
  Administered 2023-02-21 – 2023-02-23 (×2): 1 mg via INTRAVENOUS
  Filled 2023-02-21 (×2): qty 1

## 2023-02-21 MED ORDER — SODIUM CHLORIDE (PF) 0.9 % IJ SOLN
INTRAMUSCULAR | Status: DC | PRN
  Administered 2023-02-21: 30 mL

## 2023-02-21 MED ORDER — FENTANYL CITRATE (PF) 100 MCG/2ML IJ SOLN
INTRAMUSCULAR | Status: AC
  Filled 2023-02-21: qty 2

## 2023-02-21 MED ORDER — ONDANSETRON HCL 4 MG/2ML IJ SOLN
INTRAMUSCULAR | Status: AC
  Filled 2023-02-21: qty 4

## 2023-02-21 MED ORDER — BUPIVACAINE IN DEXTROSE 0.75-8.25 % IT SOLN
INTRATHECAL | Status: DC | PRN
  Administered 2023-02-21: 2 mL via INTRATHECAL

## 2023-02-21 MED ORDER — LACTATED RINGERS IV SOLN: INTRAVENOUS | Status: DC | PRN

## 2023-02-21 MED ORDER — IRBESARTAN 150 MG PO TABS
300.0000 mg | ORAL_TABLET | Freq: Every day | ORAL | Status: DC
  Administered 2023-02-22 – 2023-02-25 (×4): 300 mg via ORAL
  Filled 2023-02-21 (×4): qty 2

## 2023-02-21 MED ORDER — METHOCARBAMOL 500 MG PO TABS
500.0000 mg | ORAL_TABLET | Freq: Four times a day (QID) | ORAL | Status: DC | PRN
  Administered 2023-02-21 – 2023-02-23 (×6): 500 mg via ORAL
  Filled 2023-02-21 (×7): qty 1

## 2023-02-21 MED ORDER — EPINEPHRINE PF 1 MG/ML IJ SOLN
INTRAMUSCULAR | Status: AC
  Filled 2023-02-21: qty 1

## 2023-02-21 MED ORDER — ALPRAZOLAM 0.5 MG PO TABS
1.0000 mg | ORAL_TABLET | Freq: Every day | ORAL | Status: DC
  Administered 2023-02-21 – 2023-02-24 (×4): 1 mg via ORAL
  Filled 2023-02-21 (×4): qty 2

## 2023-02-21 MED ORDER — BUPIVACAINE HCL 0.25 % IJ SOLN
INTRAMUSCULAR | Status: AC
  Filled 2023-02-21: qty 1

## 2023-02-21 MED ORDER — METHOCARBAMOL 500 MG IVPB - SIMPLE MED: 500.0000 mg | Freq: Four times a day (QID) | INTRAVENOUS | Status: DC | PRN

## 2023-02-21 MED ORDER — OXYCODONE HCL 5 MG/5ML PO SOLN: 5.0000 mg | Freq: Once | ORAL | Status: DC | PRN

## 2023-02-21 MED ORDER — TOBRAMYCIN SULFATE 1.2 G IJ SOLR
INTRAMUSCULAR | Status: AC
  Filled 2023-02-21: qty 9.6

## 2023-02-21 MED ORDER — EPHEDRINE SULFATE (PRESSORS) 50 MG/ML IJ SOLN
INTRAMUSCULAR | Status: DC | PRN
  Administered 2023-02-21 (×3): 10 mg via INTRAVENOUS

## 2023-02-21 MED ORDER — POVIDONE-IODINE 10 % EX SWAB
2.0000 | Freq: Once | CUTANEOUS | Status: AC
  Administered 2023-02-21: 2 via TOPICAL

## 2023-02-21 MED ORDER — OXYCODONE HCL 5 MG PO TABS
10.0000 mg | ORAL_TABLET | ORAL | Status: DC | PRN
  Administered 2023-02-21: 10 mg via ORAL
  Administered 2023-02-22 – 2023-02-24 (×9): 15 mg via ORAL
  Administered 2023-02-25: 10 mg via ORAL
  Filled 2023-02-21 (×7): qty 3
  Filled 2023-02-21 (×2): qty 2
  Filled 2023-02-21: qty 3
  Filled 2023-02-21: qty 2
  Filled 2023-02-21 (×2): qty 3

## 2023-02-21 MED ORDER — OXYCODONE HCL 5 MG PO TABS
5.0000 mg | ORAL_TABLET | ORAL | Status: DC | PRN
  Administered 2023-02-22 – 2023-02-25 (×5): 10 mg via ORAL
  Filled 2023-02-21: qty 2
  Filled 2023-02-21: qty 1
  Filled 2023-02-21 (×2): qty 2
  Filled 2023-02-21: qty 1
  Filled 2023-02-21 (×2): qty 2

## 2023-02-21 MED ORDER — METOCLOPRAMIDE HCL 5 MG PO TABS: 5.0000 mg | ORAL_TABLET | Freq: Three times a day (TID) | ORAL | Status: DC | PRN

## 2023-02-21 MED ORDER — LACTATED RINGERS IV SOLN: INTRAVENOUS | Status: DC

## 2023-02-21 MED ORDER — TRANEXAMIC ACID-NACL 1000-0.7 MG/100ML-% IV SOLN
1000.0000 mg | INTRAVENOUS | Status: AC
  Administered 2023-02-21: 1000 mg via INTRAVENOUS
  Filled 2023-02-21: qty 100

## 2023-02-21 MED ORDER — BISACODYL 10 MG RE SUPP: 10.0000 mg | Freq: Every day | RECTAL | Status: DC | PRN

## 2023-02-21 MED ORDER — FENTANYL CITRATE PF 50 MCG/ML IJ SOSY
50.0000 ug | PREFILLED_SYRINGE | INTRAMUSCULAR | Status: AC
  Administered 2023-02-21: 50 ug via INTRAVENOUS
  Filled 2023-02-21: qty 2

## 2023-02-21 MED ORDER — DIPHENHYDRAMINE HCL 12.5 MG/5ML PO ELIX: 12.5000 mg | ORAL_SOLUTION | ORAL | Status: DC | PRN

## 2023-02-21 MED ORDER — PHENOL 1.4 % MT LIQD: 1.0000 | OROMUCOSAL | Status: DC | PRN

## 2023-02-21 MED ORDER — ONDANSETRON HCL 4 MG PO TABS: 4.0000 mg | ORAL_TABLET | Freq: Four times a day (QID) | ORAL | Status: DC | PRN

## 2023-02-21 MED ORDER — OXYCODONE HCL 5 MG PO TABS: 5.0000 mg | ORAL_TABLET | Freq: Once | ORAL | Status: DC | PRN

## 2023-02-21 MED ORDER — VANCOMYCIN HCL 1000 MG IV SOLR
INTRAVENOUS | Status: AC
  Filled 2023-02-21: qty 160

## 2023-02-21 MED ORDER — FENTANYL CITRATE (PF) 100 MCG/2ML IJ SOLN
INTRAMUSCULAR | Status: DC | PRN
  Administered 2023-02-21: 50 ug via INTRAVENOUS

## 2023-02-21 MED ORDER — PROPOFOL 1000 MG/100ML IV EMUL
INTRAVENOUS | Status: AC
  Filled 2023-02-21: qty 200

## 2023-02-21 MED ORDER — SODIUM CHLORIDE 0.9 % IV SOLN: INTRAVENOUS | Status: DC

## 2023-02-21 MED ORDER — PROPOFOL 500 MG/50ML IV EMUL
INTRAVENOUS | Status: DC | PRN
  Administered 2023-02-21: 30 ug via INTRAVENOUS
  Administered 2023-02-21: 100 ug/kg/min via INTRAVENOUS

## 2023-02-21 MED ORDER — ALBUMIN HUMAN 5 % IV SOLN: INTRAVENOUS | Status: DC | PRN

## 2023-02-21 MED ORDER — PROPOFOL 1000 MG/100ML IV EMUL
INTRAVENOUS | Status: AC
  Filled 2023-02-21: qty 100

## 2023-02-21 MED ORDER — MIDAZOLAM HCL 2 MG/2ML IJ SOLN
1.0000 mg | INTRAMUSCULAR | Status: AC
  Administered 2023-02-21: 1 mg via INTRAVENOUS
  Filled 2023-02-21: qty 2

## 2023-02-21 MED ORDER — SODIUM CHLORIDE (PF) 0.9 % IJ SOLN
INTRAMUSCULAR | Status: AC
  Filled 2023-02-21: qty 50

## 2023-02-21 MED ORDER — ASPIRIN 81 MG PO CHEW
81.0000 mg | CHEWABLE_TABLET | Freq: Two times a day (BID) | ORAL | Status: DC
  Administered 2023-02-21 – 2023-02-25 (×8): 81 mg via ORAL
  Filled 2023-02-21 (×8): qty 1

## 2023-02-21 MED ORDER — CEFAZOLIN SODIUM-DEXTROSE 2-4 GM/100ML-% IV SOLN
2.0000 g | Freq: Four times a day (QID) | INTRAVENOUS | Status: AC
  Administered 2023-02-21 – 2023-02-22 (×2): 2 g via INTRAVENOUS
  Filled 2023-02-21 (×2): qty 100

## 2023-02-21 MED ORDER — TRANEXAMIC ACID-NACL 1000-0.7 MG/100ML-% IV SOLN
1000.0000 mg | Freq: Once | INTRAVENOUS | Status: AC
  Administered 2023-02-21: 1000 mg via INTRAVENOUS
  Filled 2023-02-21: qty 100

## 2023-02-21 MED ORDER — ONDANSETRON HCL 4 MG/2ML IJ SOLN
INTRAMUSCULAR | Status: DC | PRN
  Administered 2023-02-21: 4 mg via INTRAVENOUS

## 2023-02-21 MED ORDER — PROPOFOL 500 MG/50ML IV EMUL
INTRAVENOUS | Status: AC
  Filled 2023-02-21: qty 100

## 2023-02-21 MED ORDER — ACETAMINOPHEN 500 MG PO TABS
1000.0000 mg | ORAL_TABLET | Freq: Four times a day (QID) | ORAL | Status: AC
  Administered 2023-02-21 – 2023-02-22 (×4): 1000 mg via ORAL
  Filled 2023-02-21 (×4): qty 2

## 2023-02-21 MED ORDER — DOCUSATE SODIUM 100 MG PO CAPS
100.0000 mg | ORAL_CAPSULE | Freq: Two times a day (BID) | ORAL | Status: DC
  Administered 2023-02-21 – 2023-02-25 (×8): 100 mg via ORAL
  Filled 2023-02-21 (×8): qty 1

## 2023-02-21 MED ORDER — CEFAZOLIN SODIUM-DEXTROSE 2-4 GM/100ML-% IV SOLN
2.0000 g | INTRAVENOUS | Status: AC
  Administered 2023-02-21: 2 g via INTRAVENOUS
  Filled 2023-02-21: qty 100

## 2023-02-21 MED ORDER — TOBRAMYCIN SULFATE 1.2 G IJ SOLR
INTRAMUSCULAR | Status: DC | PRN
  Administered 2023-02-21: 7

## 2023-02-21 MED ORDER — TAMSULOSIN HCL 0.4 MG PO CAPS
0.4000 mg | ORAL_CAPSULE | Freq: Every evening | ORAL | Status: DC
  Administered 2023-02-21 – 2023-02-24 (×4): 0.4 mg via ORAL
  Filled 2023-02-21 (×4): qty 1

## 2023-02-21 MED ORDER — ROPIVACAINE HCL 5 MG/ML IJ SOLN
INTRAMUSCULAR | Status: DC | PRN
  Administered 2023-02-21: 25 mL via PERINEURAL

## 2023-02-21 MED ORDER — DEXAMETHASONE SODIUM PHOSPHATE 10 MG/ML IJ SOLN
8.0000 mg | Freq: Once | INTRAMUSCULAR | Status: AC
  Administered 2023-02-21: 8 mg via INTRAVENOUS

## 2023-02-21 MED ORDER — CHLORHEXIDINE GLUCONATE 0.12 % MT SOLN
15.0000 mL | Freq: Once | OROMUCOSAL | Status: AC
  Administered 2023-02-21: 15 mL via OROMUCOSAL

## 2023-02-21 MED ORDER — DEXAMETHASONE SODIUM PHOSPHATE 10 MG/ML IJ SOLN
10.0000 mg | Freq: Once | INTRAMUSCULAR | Status: AC
  Administered 2023-02-22: 10 mg via INTRAVENOUS
  Filled 2023-02-21: qty 1

## 2023-02-21 MED ORDER — ORAL CARE MOUTH RINSE: 15.0000 mL | Freq: Once | OROMUCOSAL | Status: AC

## 2023-02-21 MED ORDER — KETOROLAC TROMETHAMINE 30 MG/ML IJ SOLN
INTRAMUSCULAR | Status: AC
  Filled 2023-02-21: qty 1

## 2023-02-21 MED ORDER — PHENYLEPHRINE HCL-NACL 20-0.9 MG/250ML-% IV SOLN
INTRAVENOUS | Status: DC | PRN
  Administered 2023-02-21: 25 ug/min via INTRAVENOUS

## 2023-02-21 MED ORDER — MENTHOL 3 MG MT LOZG: 1.0000 | LOZENGE | OROMUCOSAL | Status: DC | PRN

## 2023-02-21 MED ORDER — DEXAMETHASONE SODIUM PHOSPHATE 10 MG/ML IJ SOLN
INTRAMUSCULAR | Status: AC
  Filled 2023-02-21: qty 2

## 2023-02-21 MED ORDER — POLYETHYLENE GLYCOL 3350 17 G PO PACK
17.0000 g | PACK | Freq: Two times a day (BID) | ORAL | Status: DC
  Administered 2023-02-21 – 2023-02-25 (×8): 17 g via ORAL
  Filled 2023-02-21 (×8): qty 1

## 2023-02-21 MED ORDER — METOCLOPRAMIDE HCL 5 MG/ML IJ SOLN: 5.0000 mg | Freq: Three times a day (TID) | INTRAMUSCULAR | Status: DC | PRN

## 2023-02-21 MED ORDER — FENTANYL CITRATE PF 50 MCG/ML IJ SOSY: 25.0000 ug | PREFILLED_SYRINGE | INTRAMUSCULAR | Status: DC | PRN

## 2023-02-21 MED ORDER — ALBUMIN HUMAN 5 % IV SOLN
INTRAVENOUS | Status: AC
  Filled 2023-02-21: qty 250

## 2023-02-21 SURGICAL SUPPLY — 61 items
ADH SKN CLS APL DERMABOND .7 (GAUZE/BANDAGES/DRESSINGS) ×1
BAG SPEC THK2 15X12 ZIP CLS (MISCELLANEOUS)
BAG ZIPLOCK 12X15 (MISCELLANEOUS) ×1 IMPLANT
BLADE SAW SGTL 13.0X1.19X90.0M (BLADE) ×1 IMPLANT
BLADE SAW SGTL 81X20 HD (BLADE) ×1 IMPLANT
BNDG CMPR 5X62 HK CLSR LF (GAUZE/BANDAGES/DRESSINGS) ×1
BNDG CMPR MED 10X6 ELC LF (GAUZE/BANDAGES/DRESSINGS) ×1
BNDG ELASTIC 6INX 5YD STR LF (GAUZE/BANDAGES/DRESSINGS) ×1 IMPLANT
BNDG ELASTIC 6X10 VLCR STRL LF (GAUZE/BANDAGES/DRESSINGS) IMPLANT
BOWL SMART MIX CTS (DISPOSABLE) ×1 IMPLANT
BRUSH FEMORAL CANAL (MISCELLANEOUS) ×1 IMPLANT
CEMENT HV SMART SET (Cement) ×3 IMPLANT
CNTNR URN SCR LID CUP LEK RST (MISCELLANEOUS) IMPLANT
CONT SPEC 4OZ STRL OR WHT (MISCELLANEOUS) ×1
COVER SURGICAL LIGHT HANDLE (MISCELLANEOUS) ×1 IMPLANT
CUFF TOURN SGL QUICK 34 (TOURNIQUET CUFF) ×1
CUFF TRNQT CYL 34X4.125X (TOURNIQUET CUFF) ×1 IMPLANT
DERMABOND ADVANCED .7 DNX12 (GAUZE/BANDAGES/DRESSINGS) ×1 IMPLANT
DRAPE INCISE IOBAN 66X45 STRL (DRAPES) ×3 IMPLANT
DRAPE U-SHAPE 47X51 STRL (DRAPES) ×1 IMPLANT
DRESSING AQUACEL AG SP 3.5X10 (GAUZE/BANDAGES/DRESSINGS) ×1 IMPLANT
DRSG AQUACEL AG ADV 3.5X14 (GAUZE/BANDAGES/DRESSINGS) IMPLANT
DRSG AQUACEL AG SP 3.5X10 (GAUZE/BANDAGES/DRESSINGS) ×1
DURAPREP 26ML APPLICATOR (WOUND CARE) ×2 IMPLANT
ELECT REM PT RETURN 15FT ADLT (MISCELLANEOUS) ×1 IMPLANT
FEMORAL 53 AP 75 KNEE LRG (Miscellaneous) IMPLANT
GAUZE PAD ABD 8X10 STRL (GAUZE/BANDAGES/DRESSINGS) IMPLANT
GLOVE BIO SURGEON STRL SZ 6 (GLOVE) ×1 IMPLANT
GLOVE BIOGEL PI IND STRL 6.5 (GLOVE) ×1 IMPLANT
GLOVE BIOGEL PI IND STRL 7.5 (GLOVE) ×1 IMPLANT
GLOVE ORTHO TXT STRL SZ7.5 (GLOVE) ×2 IMPLANT
GOWN STRL REUS W/ TWL LRG LVL3 (GOWN DISPOSABLE) ×3 IMPLANT
GOWN STRL REUS W/TWL LRG LVL3 (GOWN DISPOSABLE) ×3
HANDPIECE INTERPULSE COAX TIP (DISPOSABLE) ×1
IMMOBILIZER KNEE 20 (SOFTGOODS) ×1
IMMOBILIZER KNEE 20 THIGH 36 (SOFTGOODS) IMPLANT
JET LAVAGE IRRISEPT WOUND (IRRIGATION / IRRIGATOR)
KIT STIMULAN RAPID CURE  10CC (Orthopedic Implant) ×2 IMPLANT
KIT STIMULAN RAPID CURE 10CC (Orthopedic Implant) IMPLANT
KIT TURNOVER KIT A (KITS) IMPLANT
LAVAGE JET IRRISEPT WOUND (IRRIGATION / IRRIGATOR) ×1 IMPLANT
MANIFOLD NEPTUNE II (INSTRUMENTS) ×1 IMPLANT
MARKER SKIN DUAL TIP RULER LAB (MISCELLANEOUS) IMPLANT
NS IRRIG 1000ML POUR BTL (IV SOLUTION) ×1 IMPLANT
PACK TOTAL KNEE CUSTOM (KITS) ×1 IMPLANT
PADDING CAST COTTON 6X4 STRL (CAST SUPPLIES) IMPLANT
PROTECTOR NERVE ULNAR (MISCELLANEOUS) ×1 IMPLANT
SET HNDPC FAN SPRY TIP SCT (DISPOSABLE) ×1 IMPLANT
SET PAD KNEE POSITIONER (MISCELLANEOUS) ×1 IMPLANT
SOLUTION PRONTOSAN WOUND 350ML (IRRIGATION / IRRIGATOR) ×1 IMPLANT
SPACER TIB 45 A/P 70 M/L MED (Miscellaneous) IMPLANT
SPONGE T-LAP 18X18 ~~LOC~~+RFID (SPONGE) IMPLANT
STAPLER VISISTAT 35W (STAPLE) IMPLANT
SUT MNCRL AB 3-0 PS2 18 (SUTURE) ×1 IMPLANT
SUT STRATAFIX PDS+ 0 24IN (SUTURE) ×1 IMPLANT
SUT VIC AB 1 CT1 36 (SUTURE) ×2 IMPLANT
SUT VIC AB 2-0 CT1 27 (SUTURE) ×3
SUT VIC AB 2-0 CT1 TAPERPNT 27 (SUTURE) ×3 IMPLANT
TRAY FOLEY MTR SLVR 16FR STAT (SET/KITS/TRAYS/PACK) ×1 IMPLANT
WATER STERILE IRR 1000ML POUR (IV SOLUTION) ×1 IMPLANT
WRAP KNEE MAXI GEL POST OP (GAUZE/BANDAGES/DRESSINGS) ×1 IMPLANT

## 2023-02-21 NOTE — Discharge Instructions (Signed)

## 2023-02-21 NOTE — Anesthesia Preprocedure Evaluation (Signed)
Anesthesia Evaluation  Patient identified by MRN, date of birth, ID band Patient awake    Reviewed: Allergy & Precautions, H&P , NPO status , Patient's Chart, lab work & pertinent test results  Airway Mallampati: II   Neck ROM: full    Dental   Pulmonary neg pulmonary ROS   breath sounds clear to auscultation       Cardiovascular hypertension,  Rhythm:regular Rate:Normal     Neuro/Psych  PSYCHIATRIC DISORDERS  Depression       GI/Hepatic ,GERD  ,,  Endo/Other    Renal/GU      Musculoskeletal  (+) Arthritis ,    Abdominal   Peds  Hematology   Anesthesia Other Findings   Reproductive/Obstetrics                             Anesthesia Physical Anesthesia Plan  ASA: 2  Anesthesia Plan: Spinal and MAC   Post-op Pain Management: Regional block*   Induction: Intravenous  PONV Risk Score and Plan: 1 and Ondansetron, Propofol infusion and Treatment may vary due to age or medical condition  Airway Management Planned: Simple Face Mask  Additional Equipment:   Intra-op Plan:   Post-operative Plan:   Informed Consent: I have reviewed the patients History and Physical, chart, labs and discussed the procedure including the risks, benefits and alternatives for the proposed anesthesia with the patient or authorized representative who has indicated his/her understanding and acceptance.     Dental advisory given  Plan Discussed with: CRNA, Anesthesiologist and Surgeon  Anesthesia Plan Comments:        Anesthesia Quick Evaluation

## 2023-02-21 NOTE — Interval H&P Note (Signed)
History and Physical Interval Note:  02/21/2023 12:43 PM  [name] [name]  has presented today for surgery, with the diagnosis of Prosthetic joint Infection right knee.  The various methods of treatment have been discussed with the patient and family. After consideration of risks, benefits and other options for treatment, the patient has consented to  Procedure(s): EXCISIONAL TOTAL KNEE ARTHROPLASTY WITH ANTIBIOTIC SPACERS (Right) as a surgical intervention.  The patient's history has been reviewed, patient examined, no change in status, stable for surgery.  I have reviewed the patient's chart and labs.  Questions were answered to the patient's satisfaction.     [name]

## 2023-02-21 NOTE — Op Note (Unsigned)
NAME: [name] [name], [name] A. MEDICAL RECORD NO: [id number] ACCOUNT NO: [id number] DATE OF BIRTH: [birth year] FACILITY: [facility] LOCATION: [facility] PHYSICIAN: Madlyn Frankel. Charlann Boxer, MD  Operative Report   DATE OF PROCEDURE: 02/21/2023  PREOPERATIVE DIAGNOSIS:  Infected right total knee arthroplasty.  POSTOPERATIVE DIAGNOSIS:  Infected right total knee arthroplasty.  PROCEDURE:  Resection of right knee arthroplasty with placement of an articulating antibiotic spacer.  SURGEON:  Madlyn Frankel. Charlann Boxer, MD  ASSISTANT:  Rosalene Billings, PA-C.  Note that Ms. Domenic Schwab was present for the entirety of the case from preoperative positioning, perioperative management of the operative extremity, general facilitation of the case and primary wound closure.  ANESTHESIA:  Regional plus spinal.  BLOOD LOSS:  Around 300 mL.  DRAINS:  None.  TOURNIQUET:  Up for 66 minutes for 225 mmHg.  INDICATIONS FOR THE PROCEDURE:  The patient is a pleasant 72 year old gentleman with a history of a right total knee replacement performed on 10/25/2022.  Apparently, he has had problems with early wound infection or complication concerning for infection  and underwent an I and D and poly exchange on 11/15/2022.  He was on 6 weeks of IV antibiotics.  He was having some stiffness and challenges with his knee in recovery and was considering a manipulation.  I was asked to see the patient and we had  concerns regarding the appearance of his leg, regarding erythema and swelling medially and recommended laboratory studies.  Most recently his sedimentation rate was noted to be 29 and CRP of 59.  Based on this, I felt that his infection was persistent  and needed to be addressed with a 2-stage treatment with first explantation.  I reviewed with him the necessary steps and a 2-stage treatment.  We discussed the postoperative course, and expectations with an obvious risk of recurrence of infection, DVT  and the need for other surgeries.  Consent was  obtained to begin management of his right knee infection.  DESCRIPTION OF PROCEDURE:  The patient was brought to the operative theater.  Once adequate anesthesia, preoperative antibiotics, Ancef administered, he was positioned supine with a right thigh tourniquet placed.  The right lower extremity was prepped  and draped in sterile fashion.  Timeout was performed identifying the patient, planned procedure, and extremity.  The leg was exsanguinated and tourniquet elevated to 225 mmHg.  His old incision was excised for a total of 8-10 inches.  Soft tissue planes  were created.  The tissue was noted to be significantly scarred and thickened.  Median arthrotomy was made.  There did not encounter significant amount of fluid and no obvious purulence.  We had already had cultures from his previous surgery or  aspiration indicating methicillin-sensitive Staph aureus.  At this point, I performed a significant medial, lateral, parapatellar and suprapatellar synovectomy and scar debridement to allow for exposure.  I used an oscillating saw and removed the old  patellar button as well as remaining cement and the lug holes.  With this, I was able then to flex the knee up.  I used an oscillating saw to undermine the bone cement interface on the tibia and then on the femoral side.  The components were removed  thankfully, without significant bone loss.  Remaining cement was removed using osteotomes.  Once all the cement was debrided off into the femur and the tibia I opened up the distal femur and proximal tibia and reamed up to 16 mm on the femoral side and  14 on the tibial side.  I then  used pulse lavage with a canal brush irrigator with about 500 mL into the canal.  At this point, I measured the gap and we selected to build a 20 mm spacer.  I selected a medium spacer block for the KASM system.  We mixed  three batches of cement with 3 grams of vancomycin and 3.6 of tobramycin.  The cement mold was opened and  configured on the back table by Morrie Sheldon.  She also made several dowels.  In the interim, we also mixed up 2 grams of vancomycin and 2.4 tobramycin we  placed into the dissolvable antibiotic beads or stimulant.  These were allowed to rest on the back table.  While this was going on the knee was copiously irrigated with normal saline solution with 6 liters total.  Once these pre-made mold and beads were  setting we mixed another 2 batches of cement with 2 grams of vancomycin and 2.4 tobramycin for the femoral component.  The large femoral mold was then held into position until the cement fully cured.  Once the cement cured, we mixed the last batch of  cement with 1 gram of vancomycin and 1.2 of tobramycin and placed a tibial tray into the joint and brought the knee to extension to allow the cement to cure.  I let the tourniquet down at 66 minutes.  We did some final irrigation as this was going on.   We then reapproximated the extensor mechanism using a combination of #1 Vicryl and #1 Stratafix suture.  The remainder of the wound was closed with 2-0 Vicryl and a running Monocryl stitch.  The wound was clean, dry and dressed sterilely using surgical  glue and Aquacel dressing.  The patient will be admitted to the hospital.  We will have a PICC line placed for IV antibiotics for another 6 weeks.  I will consult infectious disease to get their recommendations for further antibiotic treatment as well as  to help follow the culture results.  He will be partial weightbearing and range of motion will be permitted.   PUS D: 02/21/2023 4:25:05 pm T: 02/21/2023 6:08:00 pm  JOB: [id number]/ [id number]

## 2023-02-21 NOTE — Brief Op Note (Signed)
02/21/2023  2:05 PM  PATIENT:  [name] [name]  72 y.o. male  PRE-OPERATIVE DIAGNOSIS:  Prosthetic joint Infection right total knee  POST-OPERATIVE DIAGNOSIS:  Prosthetic joint Infection right total knee  PROCEDURE:  Procedure(s): EXCISIONAL TOTAL KNEE ARTHROPLASTY WITH ANTIBIOTIC SPACERS (Right)  SURGEON:  Surgeon(s) and Role:    Durene Romans, MD - Primary  PHYSICIAN ASSISTANT: Rosalene Billings, PA-C  ANESTHESIA:   regional and spinal  EBL:  <300 cc  BLOOD ADMINISTERED:none  DRAINS: none   LOCAL MEDICATIONS USED:  NONE  SPECIMEN:  Source of Specimen:  right knee tissue and implants  DISPOSITION OF SPECIMEN:  PATHOLOGY  COUNTS:  YES  TOURNIQUET:  66 min at 225 mmHg  DICTATION: .Other Dictation: Dictation Number [id number]  PLAN OF CARE: Admit to inpatient   PATIENT DISPOSITION:  PACU - hemodynamically stable.   Delay start of Pharmacological VTE agent (>24hrs) due to surgical blood loss or risk of bleeding: no

## 2023-02-21 NOTE — Anesthesia Procedure Notes (Signed)
Anesthesia Regional Block: Adductor canal block   Pre-Anesthetic Checklist: , timeout performed,  Correct Patient, Correct Site, Correct Laterality,  Correct Procedure, Correct Position, site marked,  Risks and benefits discussed,  Surgical consent,  Pre-op evaluation,  At surgeon's request and post-op pain management  Laterality: Right  Prep: chloraprep       Needles:  Injection technique: Single-shot  Needle Type: Echogenic Needle     Needle Length: 9cm  Needle Gauge: 21     Additional Needles:   Narrative:  Start time: 02/21/2023 1:44 PM End time: 02/21/2023 1:51 PM Injection made incrementally with aspirations every 5 mL.  Performed by: Personally  Anesthesiologist: Achille Rich, MD  Additional Notes: Pt tolerated the procedure well.

## 2023-02-21 NOTE — Anesthesia Procedure Notes (Signed)
Spinal  Patient location during procedure: OR Start time: 02/21/2023 2:10 PM End time: 02/21/2023 2:12 PM Reason for block: surgical anesthesia Staffing Performed: anesthesiologist  Anesthesiologist: Achille Rich, MD Performed by: Achille Rich, MD Authorized by: Achille Rich, MD   Preanesthetic Checklist Completed: patient identified, IV checked, risks and benefits discussed, surgical consent, monitors and equipment checked, pre-op evaluation and timeout performed Spinal Block Patient position: sitting Prep: DuraPrep Patient monitoring: cardiac monitor, continuous pulse ox and blood pressure Approach: midline Location: L3-4 Injection technique: single-shot Needle Needle type: Pencan  Needle gauge: 24 G Needle length: 9 cm Assessment Sensory level: T10 Events: CSF return Additional Notes Functioning IV was confirmed and monitors were applied. Sterile prep and drape, including hand hygiene and sterile gloves were used. The patient was positioned and the spine was prepped. The skin was anesthetized with lidocaine.  Free flow of clear CSF was obtained prior to injecting local anesthetic into the CSF.  The spinal needle aspirated freely following injection.  The needle was carefully withdrawn.  The patient tolerated the procedure well.

## 2023-02-21 NOTE — Transfer of Care (Signed)
Immediate Anesthesia Transfer of Care Note  Patient: [name] [name]  Procedure(s) Performed: EXCISIONAL TOTAL KNEE ARTHROPLASTY WITH ANTIBIOTIC SPACERS (Right: Knee)  Patient Location: PACU  Anesthesia Type:MAC combined with regional for post-op pain  Level of Consciousness: awake and alert   Airway & Oxygen Therapy: Patient Spontanous Breathing  Post-op Assessment: Report given to RN and Post -op Vital signs reviewed and stable  Post vital signs: Reviewed and stable  Last Vitals:  Vitals Value Taken Time  BP 103/71 02/21/23 1643  Temp    Pulse 69 02/21/23 1645  Resp 13 02/21/23 1645  SpO2 96 % 02/21/23 1645  Vitals shown include unvalidated device data.  Last Pain:  Vitals:   02/21/23 1242  TempSrc: Oral  PainSc: 3       Patients Stated Pain Goal: 0 (02/21/23 1242)  Complications: No notable events documented.

## 2023-02-21 NOTE — H&P (Signed)
TOTAL KNEE REVISION ADMISSION H&P  Patient is being admitted for prosthetic joint infection, right knee.   Subjective:  Chief Complaint:right knee pain, PJI  HPI: [name], 72 y.o. male. He has a history of right total knee by Dr.Collins on 10/25/22 and subsequent I&D w/ poly exchange on 11/15/22. He states he was on IV antibiotics for 6 weeks and on 4/9 infectious disease removed his PICC line. He had been on Cefadroxil 500mg  bid, but developed increased redness in the knee about a week ago. He had elevated laboratory studies of ESR 29 and CRP 59. Decision was made for resection with placement of antibiotic spacer.   Patient Active Problem List   Diagnosis Date Noted   Sepsis due to methicillin resistant Staphylococcus aureus (MRSA) (HCC) 11/18/2022   Penicillin allergy 11/18/2022   Infection of right knee (HCC) 11/14/2022   Chronic sinusitis 09/02/2022   Arthritis of right knee 09/02/2022   Lower respiratory infection 08/08/2022   Acute cough 02/01/2022   Encounter for general adult medical examination with abnormal findings 07/09/2021   BPH (benign prostatic hyperplasia) 07/09/2021   MDD (major depressive disorder), recurrent, in full remission (HCC) 07/09/2021   Essential hypertension 10/20/2008   Past Medical History:  Diagnosis Date   Arthritis    Bronchitis    Depression    GERD (gastroesophageal reflux disease)    Hypertension    Skin cancer     Past Surgical History:  Procedure Laterality Date   CHOLECYSTECTOMY N/A 02/10/2021   Procedure: LAPAROSCOPIC CHOLECYSTECTOMY;  Surgeon: Harriette Bouillon, MD;  Location: [facility] OR;  Service: General;  Laterality: N/A;   HERNIA REPAIR     I & D KNEE WITH POLY EXCHANGE Right 11/15/2022   Procedure: IRRIGATION AND DEBRIDEMENT KNEE WITH  POLY EXCHANGE;  Surgeon: Eugenia Mcalpine, MD;  Location: [facility];  Service: Orthopedics;  Laterality: Right;  adductor canal, no antibiotics prior to culture add on room to follow other case 60    Knee Surgery Right 10/25/2022   ROTATOR CUFF REPAIR     SKIN CANCER EXCISION      No current facility-administered medications for this encounter.   Current Outpatient Medications  Medication Sig Dispense Refill Last Dose   ALPRAZolam (XANAX) 1 MG tablet Take 1 mg by mouth at bedtime.      calcium carbonate (TUMS - DOSED IN MG ELEMENTAL CALCIUM) 500 MG chewable tablet Chew 2 tablets by mouth 2 (two) times daily as needed for indigestion or heartburn.      cefadroxil (DURICEF) 500 MG capsule Take 1 capsule (500 mg total) by mouth 2 (two) times daily. 60 capsule 2    diclofenac (VOLTAREN) 75 MG EC tablet Take 75 mg by mouth 2 (two) times daily.      Ketotifen Fumarate (ALLERGY EYE DROPS OP) Place 1 drop into both eyes daily as needed (red/itchy eyes).      methocarbamol (ROBAXIN) 500 MG tablet Take 1 tablet (500 mg total) by mouth 4 (four) times daily. (Patient taking differently: Take 500 mg by mouth every 6 (six) hours as needed for muscle spasms.) 60 tablet 0    oxyCODONE (OXY IR/ROXICODONE) 5 MG immediate release tablet Take 5-10 mg by mouth every 4 (four) hours as needed for severe pain.      polyethylene glycol powder (GLYCOLAX/MIRALAX) 17 GM/SCOOP powder Take 17 g by mouth in the morning.      sertraline (ZOLOFT) 100 MG tablet TAKE 1 TABLET BY MOUTH EVERY DAY 90 tablet 3  sodium chloride (OCEAN) 0.65 % SOLN nasal spray Place 1 spray into both nostrils as needed for congestion.      tamsulosin (FLOMAX) 0.4 MG CAPS capsule Take 0.4 mg by mouth every evening.  0    telmisartan (MICARDIS) 80 MG tablet TAKE 1 TABLET BY MOUTH EVERY DAY 90 tablet 3    traMADol (ULTRAM) 50 MG tablet Take 1 tablet (50 mg total) by mouth every 6 (six) hours as needed. 30 tablet 0    APPLE CIDER VINEGAR PO Take 1 capsule by mouth daily.      Ergocalciferol (VITAMIN D2) 50 MCG (2000 UT) TABS Take 2,000 Units by mouth daily.      Magnesium 500 MG TABS Take 500 mg by mouth daily.      Multiple Vitamin (MULTI-VITAMIN  PO) Take 1 tablet by mouth daily.      naproxen (NAPROSYN) 500 MG tablet Take 500 mg by mouth daily.      Omega-3 Fatty Acids (FISH OIL) 1200 MG CAPS Take 1,200 mg by mouth daily.      Vitamin E 670 MG (1000 UT) CAPS Take 670 mg by mouth daily.      Zinc 30 MG TABS Take 30 mg by mouth daily.      zolpidem (AMBIEN) 5 MG tablet Take 1 tablet (5 mg total) by mouth at bedtime as needed for sleep. (Patient not taking: Reported on 02/17/2023) 30 tablet 0 Not Taking   Allergies  Allergen Reactions   Avapro [Irbesartan] Cough   Lexapro [Escitalopram Oxalate] Other (See Comments)    Anxiety, insomnia    Social History   Tobacco Use   Smoking status: Never   Smokeless tobacco: Never  Substance Use Topics   Alcohol use: Yes    Comment: socially    Family History  Problem Relation Age of Onset   Emphysema Mother        heart failure indusce emphysema   Heart disease Father    Heart attack Father    Heart disease Sister 30       [id number]      Review of Systems  Constitutional:  Negative for chills and fever.  Respiratory:  Negative for cough and shortness of breath.   Cardiovascular:  Negative for chest pain.  Gastrointestinal:  Negative for nausea and vomiting.  Musculoskeletal:  Positive for arthralgias.      Objective:  Physical Exam BMI recorded at 35.3 Very pleasant 72 year old male awake alert and oriented. He is in no acute distress. He walks in the office with a significant limp favoring this right knee.  Right knee exam: There was surgical incision at this point is healed without signs of drainage or dehiscence he does have significant erythema and swelling to the medial aspect of the incision from the midportion distally. There is associated tenderness with this He lacks 5 to 10 degrees of extension and flexes at this point to about 80 degrees with pain Mild lower extremity edema with associated erythema but no calf tenderness   Vital signs in last 24 hours: Temp:   [98.5 F (36.9 C)] 98.5 F (36.9 C) (05/20 0820) Pulse Rate:  [67] 67 (05/20 0820) Resp:  [20] 20 (05/20 0820) BP: (127)/(79) 127/79 (05/20 0820) SpO2:  [100 %] 100 % (05/20 0820) Weight:  [104.7 kg] 104.7 kg (05/20 0820)  Labs:  Estimated body mass index is 34.08 kg/m as calculated from the following:   Height as of 02/20/23: 5\' 9"  (1.753 m).   Weight as  of 02/20/23: 104.7 kg.  Imaging Review  Imaging: Today I reviewed past radiographs from his right knee now just 3-1/76-month status post his right total knee replacement. The radiographs that indicated stable femoral and tibial components without evidence of any complication or concerns.   Assessment/Plan:  Prosthetic joint infection, right knee   The patient history, physical examination, clinical judgment of the provider and imaging studies are consistent with PJI of the right knee(s), previous total knee arthroplasty. Revision total knee arthroplasty is deemed medically necessary. The treatment options including medical management, injection therapy, arthroscopy and revision arthroplasty were discussed at length. The risks and benefits of revision total knee arthroplasty were presented and reviewed. The risks due to aseptic loosening, infection, stiffness, patella tracking problems, thromboembolic complications and other imponderables were discussed. The patient acknowledged the explanation, agreed to proceed with the plan and consent was signed. Patient is being admitted for inpatient treatment for surgery, pain control, PT, OT, prophylactic antibiotics, VTE prophylaxis, progressive ambulation and ADL's and discharge planning.The patient is planning to be discharged  home.   Rosalene Billings, PA-C Orthopedic Surgery [facility] [place] [phone]

## 2023-02-22 ENCOUNTER — Encounter (HOSPITAL_COMMUNITY): Payer: Self-pay | Admitting: Orthopedic Surgery

## 2023-02-22 DIAGNOSIS — T8453XA Infection and inflammatory reaction due to internal right knee prosthesis, initial encounter: Secondary | ICD-10-CM

## 2023-02-22 LAB — CBC
HCT: 32.3 % — ABNORMAL LOW (ref 39.0–52.0)
Hemoglobin: 10.3 g/dL — ABNORMAL LOW (ref 13.0–17.0)
MCH: 28 pg (ref 26.0–34.0)
MCHC: 31.9 g/dL (ref 30.0–36.0)
MCV: 87.8 fL (ref 80.0–100.0)
Platelets: 205 10*3/uL (ref 150–400)
RBC: 3.68 MIL/uL — ABNORMAL LOW (ref 4.22–5.81)
RDW: 15.1 % (ref 11.5–15.5)
WBC: 8.9 10*3/uL (ref 4.0–10.5)
nRBC: 0 % (ref 0.0–0.2)

## 2023-02-22 LAB — BASIC METABOLIC PANEL
Anion gap: 6 (ref 5–15)
BUN: 13 mg/dL (ref 8–23)
CO2: 26 mmol/L (ref 22–32)
Calcium: 8.7 mg/dL — ABNORMAL LOW (ref 8.9–10.3)
Chloride: 104 mmol/L (ref 98–111)
Creatinine, Ser: 0.79 mg/dL (ref 0.61–1.24)
GFR, Estimated: >60 mL/min (ref >60)
Glucose, Bld: 145 mg/dL — ABNORMAL HIGH (ref 70–99)
Potassium: 4 mmol/L (ref 3.5–5.1)
Sodium: 136 mmol/L (ref 135–145)

## 2023-02-22 LAB — AEROBIC/ANAEROBIC CULTURE W GRAM STAIN (SURGICAL/DEEP WOUND): Gram Stain: NONE SEEN

## 2023-02-22 MED ORDER — SODIUM CHLORIDE 0.9 % IV SOLN
8.0000 mg/kg | Freq: Every day | INTRAVENOUS | Status: DC
  Administered 2023-02-22 – 2023-02-24 (×3): 650 mg via INTRAVENOUS
  Filled 2023-02-22 (×3): qty 13

## 2023-02-22 MED ORDER — SODIUM CHLORIDE 0.9% FLUSH: 10.0000 mL | INTRAVENOUS | Status: DC | PRN

## 2023-02-22 MED ORDER — CHLORHEXIDINE GLUCONATE CLOTH 2 % EX PADS
6.0000 | MEDICATED_PAD | Freq: Every day | CUTANEOUS | Status: DC
  Administered 2023-02-22 – 2023-02-25 (×4): 6 via TOPICAL

## 2023-02-22 NOTE — Progress Notes (Signed)
   Subjective: 1 Day Post-Op Procedure(s) (LRB): EXCISIONAL TOTAL KNEE ARTHROPLASTY WITH ANTIBIOTIC SPACERS (Right) Patient reports pain as mild.   Patient seen in rounds by Dr. Charlann Boxer. Patient is well, and has had no acute complaints or problems. No acute events overnight. He has not been up with PT yet.  We will start therapy today.   Objective: Vital signs in last 24 hours: Temp:  [97.5 F (36.4 C)-98.3 F (36.8 C)] 97.8 F (36.6 C) (05/22 0550) Pulse Rate:  [50-69] 65 (05/22 0550) Resp:  [11-22] 18 (05/22 0550) BP: (99-170)/(61-94) 111/63 (05/22 0550) SpO2:  [95 %-100 %] 99 % (05/22 0550) Weight:  [104.7 kg] 104.7 kg (05/21 2032)  Intake/Output from previous day:  Intake/Output Summary (Last 24 hours) at 02/22/2023 0713 Last data filed at 02/22/2023 1610 Gross per 24 hour  Intake 4259.43 ml  Output 3150 ml  Net 1109.43 ml     Intake/Output this shift: No intake/output data recorded.  Labs: Recent Labs    02/20/23 0845 02/22/23 0333  HGB 13.1 10.3*   Recent Labs    02/20/23 0845 02/22/23 0333  WBC 4.3 8.9  RBC 4.81 3.68*  HCT 41.6 32.3*  PLT 199 205   Recent Labs    02/20/23 0845 02/22/23 0333  NA 138 136  K 4.1 4.0  CL 105 104  CO2 25 26  BUN 20 13  CREATININE 0.76 0.79  GLUCOSE 97 145*  CALCIUM 9.3 8.7*   No results for input(s): "LABPT", "INR" in the last 72 hours.  Exam: General - Patient is Alert and Oriented Extremity - Neurologically intact Sensation intact distally Intact pulses distally Dorsiflexion/Plantar flexion intact Dressing - dressing C/D/I Motor Function - intact, moving foot and toes well on exam.   Past Medical History:  Diagnosis Date   Arthritis    Bronchitis    Depression    GERD (gastroesophageal reflux disease)    Hypertension    Skin cancer     Assessment/Plan: 1 Day Post-Op Procedure(s) (LRB): EXCISIONAL TOTAL KNEE ARTHROPLASTY WITH ANTIBIOTIC SPACERS (Right) Principal Problem:   Infection of prosthetic  right knee joint (HCC)  Estimated body mass index is 34.09 kg/m as calculated from the following:   Height as of this encounter: 5\' 9"  (1.753 m).   Weight as of this encounter: 104.7 kg. Advance diet Up with therapy   DVT Prophylaxis - Aspirin PWB 50% RLE Knee immobilizer when up  Prior cultures from 11/2022 uploaded with sensitivities - staph aureus We took new intra-op cultures which are pending Will consult ID  Up with PT today to practice mobilizing with the spacer PICC to be placed today  Dennie Bible, PA-C Orthopedic Surgery [phone] 02/22/2023, 7:13 AM

## 2023-02-22 NOTE — TOC Initial Note (Signed)
Transition of Care [facility]) - Initial/Assessment Note    Patient Details  Name: [name] [name] MRN: [id number] Date of Birth: 1951-03-30  [facility]) CM/SW Contact:    Amada Jupiter, LCSW Phone Number: 02/22/2023, 2:21 PM  Clinical Narrative:                  Met with pt to review potential dc needs.  Pt confirms that plan will be for him to dc home with IV abx again and he requests referrals to the same HH/ infusion agencies he had in February:  [facility].   PICC being placed this afternoon and pt hopeful will be clear for dc by tomorrow.  Referrals made and accepted with [facility] and [name].  TOC will continue to follow should any additional needs arise.  Expected Discharge Plan: Home w Home Health Services Barriers to Discharge: Continued Medical Work up   Patient Goals and CMS Choice Patient states their goals for this hospitalization and ongoing recovery are:: retur home          Expected Discharge Plan and Services In-house Referral: Clinical Social Work   Post Acute Care Choice: Home Health Living arrangements for the past 2 months: Single Family Home                 DME Arranged: N/A DME Agency: NA       HH Arranged: RN, IV Antibiotics HH Agency: [facility] Date HH Agency Contacted: 02/22/23 Time HH Agency Contacted: 1419 Representative spoke with at [facility] Agency: [facility] - Jeri Modena;  [facility] - Lorenza Chick  Prior Living Arrangements/Services Living arrangements for the past 2 months: Single Family Home Lives with:: Spouse Patient language and need for interpreter reviewed:: Yes Do you feel safe going back to the place where you live?: Yes      Need for Family Participation in Patient Care: Yes (Comment) Care giver support system in place?: Yes (comment)   Criminal Activity/Legal Involvement Pertinent to Current Situation/Hospitalization: No - Comment as needed  Activities of Daily Living Home Assistive  Devices/Equipment: Blood pressure cuff, Cane (specify quad or straight), Eyeglasses, Grab bars in shower, Hand-held shower hose, Walker (specify type) ADL Screening (condition at time of admission) Patient's cognitive ability adequate to safely complete daily activities?: Yes Is the patient deaf or have difficulty hearing?: No Does the patient have difficulty seeing, even when wearing glasses/contacts?: No Does the patient have difficulty concentrating, remembering, or making decisions?: No Patient able to express need for assistance with ADLs?: Yes Does the patient have difficulty dressing or bathing?: No Independently performs ADLs?: Yes (appropriate for developmental age) Does the patient have difficulty walking or climbing stairs?: No Weakness of Legs: Right Weakness of Arms/Hands: None  Permission Sought/Granted Permission sought to share information with : Family Supports Permission granted to share information with : Yes, Verbal Permission Granted  Share Information with NAME: spouse, Drue Huizinga @ [phone]           Emotional Assessment Appearance:: Appears stated age Attitude/Demeanor/Rapport: Gracious Affect (typically observed): Accepting Orientation: : Oriented to Self, Oriented to Place, Oriented to  Time, Oriented to Situation Alcohol / Substance Use: Not Applicable Psych Involvement: No (comment)  Admission diagnosis:  Infection of prosthetic right knee joint [facility]) [T84.53XA] 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   Sepsis due to methicillin resistant Staphylococcus aureus (MRSA) (HCC) 11/18/2022   Penicillin  allergy 11/18/2022   Infection of right knee (HCC) 11/14/2022   Chronic sinusitis 09/02/2022   Arthritis of right knee 09/02/2022   Lower respiratory infection 08/08/2022   Acute cough 02/01/2022   Encounter for general adult medical examination with abnormal findings  07/09/2021   BPH (benign prostatic hyperplasia) 07/09/2021   MDD (major depressive disorder), recurrent, in full remission (HCC) 07/09/2021   Essential hypertension 10/20/2008   PCP:  Myrlene Broker, MD Pharmacy:   [phone] - [place], [place] - [address] AT [address] OF [facility] [address] [place] [address] Phone: [phone] Fax: [phone]     Social Determinants of Health (SDOH) Social History: SDOH Screenings   Food Insecurity: No Food Insecurity (02/21/2023)  Housing: Low Risk  (02/21/2023)  Transportation Needs: No Transportation Needs (02/21/2023)  Utilities: Not At Risk (02/21/2023)  Depression (PHQ2-9): Low Risk  (07/14/2022)  Tobacco Use: Low Risk  (02/22/2023)   SDOH Interventions:     Readmission Risk Interventions    02/22/2023    2:16 PM  Readmission Risk Prevention Plan  Post Dischage Appt Complete  Medication Screening Complete  Transportation Screening Complete

## 2023-02-22 NOTE — Evaluation (Signed)
Physical Therapy Evaluation Patient Details Name: [name] [name] MRN: [id number] DOB: 1951/03/20 Today's Date: 02/22/2023  History of Present Illness  72 yo male s/p EXCISIONAL R TOTAL KNEE ARTHROPLASTY WITH ANTIBIOTIC SPACER on 02/21/23; PMH: primary R TKA 10/25/22, I&D and  poly exchange R knee  Clinical Impression  Pt is s/p TKA  excision with placement of abx spacer resulting in the deficits listed below (see PT Problem List).  Pt is very pleasant and motivated despite pain R knee; amb ~ 30' with RW and min/guard and distance ltd by pain; anticipate steady progress with mobility.   Pt will benefit from acute skilled PT to increase their independence and safety with mobility to allow discharge.         Recommendations for follow up therapy are one component of a multi-disciplinary discharge planning process, led by the attending physician.  Recommendations may be updated based on patient status, additional functional criteria and insurance authorization.  Follow Up Recommendations       Assistance Recommended at Discharge PRN  Patient can return home with the following  Assist for transportation;A little help with bathing/dressing/bathroom;Assistance with cooking/housework;Help with stairs or ramp for entrance    Equipment Recommendations None recommended by PT  Recommendations for Other Services       Functional Status Assessment Patient has had a recent decline in their functional status and demonstrates the ability to make significant improvements in function in a reasonable and predictable amount of time.     Precautions / Restrictions Precautions Precautions: Fall;Knee Restrictions KI- right  Weight Bearing Restrictions: Yes RLE Weight Bearing: Partial weight bearing RLE Partial Weight Bearing Percentage or Pounds: 50      Mobility  Bed Mobility Overal bed mobility: Needs Assistance Bed Mobility: Supine to Sit     Supine to sit: Min guard     General bed  mobility comments: able to use gait belt as leg lifter, min/guard for safety; cues for technique    Transfers Overall transfer level: Needs assistance Equipment used: Rolling walker (2 wheels) Transfers: Sit to/from Stand Sit to Stand: Min guard, Min assist           General transfer comment: cues for RLE position    Ambulation/Gait Ambulation/Gait assistance: Min guard Gait Distance (Feet): 30 Feet Assistive device: Rolling walker (2 wheels) Gait Pattern/deviations: Step-to pattern       General Gait Details: cues for sequence, PWB, good adherence and good stability; distance limtied by pain  Stairs            Wheelchair Mobility    Modified Rankin (Stroke Patients Only)       Balance                                             Pertinent Vitals/Pain Pain Assessment Pain Assessment: Faces Faces Pain Scale: Hurts whole lot Pain Location: right knee Pain Descriptors / Indicators: Aching, Burning, Pressure, Sore Pain Intervention(s): Limited activity within patient's tolerance, Monitored during session, Premedicated before session, Repositioned, Ice applied    Home Living Family/patient expects to be discharged to:: Private residence Living Arrangements: Spouse/significant other Available Help at Discharge: Family;Available 24 hours/day Type of Home: House Home Access: Stairs to enter Entrance Stairs-Rails: Right;Left;Can reach both Entrance Stairs-Number of Steps: 2   Home Layout: One level Home Equipment: Agricultural consultant (2 wheels);Cane - single point;Grab bars - tub/shower;Grab  bars - toilet      Prior Function Prior Level of Function : Independent/Modified Independent             Mobility Comments: using RW since TKR ADLs Comments: Pt reports IND with ADL prior to this admit; wife able to assist as needed     Hand Dominance        Extremity/Trunk Assessment   Upper Extremity Assessment Upper Extremity Assessment:  Overall WFL for tasks assessed    Lower Extremity Assessment Lower Extremity Assessment: RLE deficits/detail RLE Deficits / Details: ankle WFL; knee extension and hip flexion 2+/5, limited by post op pain and weakness       Communication   Communication: No difficulties  Cognition Arousal/Alertness: Awake/alert Behavior During Therapy: WFL for tasks assessed/performed Overall Cognitive Status: Within Functional Limits for tasks assessed                                          General Comments      Exercises Total Joint Exercises Ankle Circles/Pumps: AROM, Both, 10 reps   Assessment/Plan    PT Assessment Patient needs continued PT services  PT Problem List Decreased strength;Decreased activity tolerance;Decreased balance;Decreased mobility;Decreased knowledge of precautions;Decreased knowledge of use of DME;Pain       PT Treatment Interventions DME instruction;Therapeutic exercise;Gait training;Functional mobility training;Stair training;Therapeutic activities;Patient/family education    PT Goals (Current goals can be found in the Care Plan section)  Acute Rehab PT Goals Patient Stated Goal: get TKA back after abx PT Goal Formulation: With patient Time For Goal Achievement: 03/01/23 Potential to Achieve Goals: Good    Frequency 7X/week     Co-evaluation               AM-PAC PT "6 Clicks" Mobility  Outcome Measure Help needed turning from your back to your side while in a flat bed without using bedrails?: A Little Help needed moving from lying on your back to sitting on the side of a flat bed without using bedrails?: A Little Help needed moving to and from a bed to a chair (including a wheelchair)?: A Little Help needed standing up from a chair using your arms (e.g., wheelchair or bedside chair)?: A Little Help needed to walk in hospital room?: A Little Help needed climbing 3-5 steps with a railing? : A Lot 6 Click Score: 17    End of  Session Equipment Utilized During Treatment: Gait belt;Right knee immobilizer Activity Tolerance: Patient tolerated treatment well Patient left: in chair;with call bell/phone within reach;with family/visitor present Nurse Communication: Mobility status PT Visit Diagnosis: Other abnormalities of gait and mobility (R26.89);Difficulty in walking, not elsewhere classified (R26.2)    Time: 1610-9604 PT Time Calculation (min) (ACUTE ONLY): 20 min   Charges:   PT Evaluation $PT Eval Low Complexity: 1 Low          Hydee Fleece, PT  Acute Rehab Dept [facility]) [phone]  02/22/2023   [facility] 02/22/2023, 11:35 AM

## 2023-02-22 NOTE — Progress Notes (Signed)
PHARMACY CONSULT NOTE FOR:  OUTPATIENT  PARENTERAL ANTIBIOTIC THERAPY (OPAT)  Indication: R-knee PJI Regimen: Cefazolin 2g IV every 8 hours End date: 04/04/23  IV antibiotic discharge orders are pended. To discharging provider:  please sign these orders via discharge navigator,  Select New Orders & click on the button choice - Manage This Unsigned Work.     Thank you for allowing pharmacy to be a part of this patient's care.  Georgina Pillion, PharmD, BCPS Infectious Diseases Clinical Pharmacist 02/24/2023 9:28 AM   **Pharmacist phone directory can now be found on [email or web address] ([email or web address]).  Listed under [facility].

## 2023-02-22 NOTE — Progress Notes (Signed)
Peripherally Inserted Central Catheter Placement  The IV Nurse has discussed with the patient and/or persons authorized to consent for the patient, the purpose of this procedure and the potential benefits and risks involved with this procedure.  The benefits include less needle sticks, lab draws from the catheter, and the patient may be discharged home with the catheter. Risks include, but not limited to, infection, bleeding, blood clot (thrombus formation), and puncture of an artery; nerve damage and irregular heartbeat and possibility to perform a PICC exchange if needed/ordered by physician.  Alternatives to this procedure were also discussed.  Bard Power PICC patient education guide, fact sheet on infection prevention and patient information card has been provided to patient /or left at bedside.    PICC Placement Documentation  PICC Single Lumen 02/22/23 Right Brachial 42 cm 0 cm (Active)  Indication for Insertion or Continuance of Line Home intravenous therapies (PICC only) 02/22/23 1459  Exposed Catheter (cm) 0 cm 02/22/23 1459  Site Assessment Clean, Dry, Intact 02/22/23 1459  Line Status Flushed;Saline locked;Blood return noted 02/22/23 1459  Dressing Type Transparent;Securing device 02/22/23 1459  Dressing Status Antimicrobial disc in place;Clean, Dry, Intact 02/22/23 1459  Safety Lock Not Applicable 02/22/23 1459  Line Care Zeroed and calibrated 02/22/23 1459  Line Adjustment (NICU/IV Team Only) No 02/22/23 1459  Dressing Intervention New dressing 02/22/23 1459  Dressing Change Due 03/01/23 02/22/23 1459       Rogelio Waynick, Lajean Manes 02/22/2023, 3:02 PM

## 2023-02-22 NOTE — Progress Notes (Signed)
Physical Therapy Treatment Patient Details Name: [name] [name] MRN: [id number] DOB: 19-Nov-1950 Today's Date: 02/22/2023   History of Present Illness 72 yo male s/p EXCISIONAL R TOTAL KNEE ARTHROPLASTY WITH ANTIBIOTIC SPACER on 02/21/23; PMH: primary R TKA 10/25/22, I&D and  poly exchange R knee    PT Comments    Pt progressing toward goals; remains motivated to work with PT, reviewed spacer/knee ROM/limitations and PWB. Pain somewhat elevated today therefore deferred exercises until next session.  Anticipate continued progress with mobility especially as pain becomes better controlled   Recommendations for follow up therapy are one component of a multi-disciplinary discharge planning process, led by the attending physician.  Recommendations may be updated based on patient status, additional functional criteria and insurance authorization.  Follow Up Recommendations       Assistance Recommended at Discharge PRN  Patient can return home with the following Assist for transportation;A little help with bathing/dressing/bathroom;Assistance with cooking/housework;Help with stairs or ramp for entrance   Equipment Recommendations  None recommended by PT    Recommendations for Other Services       Precautions / Restrictions Precautions Precautions: Fall;Knee Required Braces or Orthoses: Knee Immobilizer - Right Knee Immobilizer - Right: On when out of bed or walking Restrictions RLE Weight Bearing: Partial weight bearing RLE Partial Weight Bearing Percentage or Pounds: 50     Mobility  Bed Mobility Overal bed mobility: Needs Assistance Bed Mobility: Supine to Sit, Sit to Supine     Supine to sit: Min guard, Supervision Sit to supine: Min guard, Supervision   General bed mobility comments: able to progress RLE off and on to bed, min/guard for safety;    Transfers Overall transfer level: Needs assistance Equipment used: Rolling walker (2 wheels) Transfers: Sit to/from  Stand Sit to Stand: Min guard           General transfer comment: cues for RLE position, hand placement    Ambulation/Gait Ambulation/Gait assistance: Min guard Gait Distance (Feet): 50 Feet Assistive device: Rolling walker (2 wheels) Gait Pattern/deviations: Step-to pattern       General Gait Details: cues for sequence, PWB, good adherence and good stability; distance limtied by pain; 3 brief standing rests d/t pain and UE fatigue   Stairs             Wheelchair Mobility    Modified Rankin (Stroke Patients Only)       Balance                                            Cognition Arousal/Alertness: Awake/alert Behavior During Therapy: WFL for tasks assessed/performed Overall Cognitive Status: Within Functional Limits for tasks assessed                                          Exercises Total Joint Exercises Ankle Circles/Pumps: AROM, Both, 10 reps    General Comments        Pertinent Vitals/Pain Pain Assessment Pain Assessment: Faces Faces Pain Scale: Hurts whole lot Pain Location: right knee Pain Descriptors / Indicators: Aching, Burning, Pressure, Sore Pain Intervention(s): Limited activity within patient's tolerance, Monitored during session, Premedicated before session, Repositioned    Home Living  Prior Function            PT Goals (current goals can now be found in the care plan section) Acute Rehab PT Goals Patient Stated Goal: get TKA back after abx PT Goal Formulation: With patient Time For Goal Achievement: 03/01/23 Potential to Achieve Goals: Good Progress towards PT goals: Progressing toward goals    Frequency    7X/week      PT Plan Current plan remains appropriate    Co-evaluation              AM-PAC PT "6 Clicks" Mobility   Outcome Measure  Help needed turning from your back to your side while in a flat bed without using bedrails?: A  Little Help needed moving from lying on your back to sitting on the side of a flat bed without using bedrails?: A Little Help needed moving to and from a bed to a chair (including a wheelchair)?: A Little Help needed standing up from a chair using your arms (e.g., wheelchair or bedside chair)?: A Little Help needed to walk in hospital room?: A Little Help needed climbing 3-5 steps with a railing? : A Lot 6 Click Score: 17    End of Session Equipment Utilized During Treatment: Gait belt;Right knee immobilizer Activity Tolerance: Patient tolerated treatment well Patient left: with call bell/phone within reach;in bed;with bed alarm set Nurse Communication: Mobility status PT Visit Diagnosis: Other abnormalities of gait and mobility (R26.89);Difficulty in walking, not elsewhere classified (R26.2)     Time: 1610-9604 PT Time Calculation (min) (ACUTE ONLY): 23 min  Charges:  $Gait Training: 23-37 mins                     Delice Bison, PT  Acute Rehab Dept [facility]) [phone]  02/22/2023    [facility] 02/22/2023, 4:42 PM

## 2023-02-23 ENCOUNTER — Encounter (HOSPITAL_COMMUNITY): Payer: Self-pay | Admitting: Orthopedic Surgery

## 2023-02-23 LAB — CBC
HCT: 29.4 % — ABNORMAL LOW (ref 39.0–52.0)
Hemoglobin: 9.4 g/dL — ABNORMAL LOW (ref 13.0–17.0)
MCH: 27.9 pg (ref 26.0–34.0)
MCHC: 32 g/dL (ref 30.0–36.0)
MCV: 87.2 fL (ref 80.0–100.0)
Platelets: 187 10*3/uL (ref 150–400)
RBC: 3.37 MIL/uL — ABNORMAL LOW (ref 4.22–5.81)
RDW: 15.5 % (ref 11.5–15.5)
WBC: 7.7 10*3/uL (ref 4.0–10.5)
nRBC: 0 % (ref 0.0–0.2)

## 2023-02-23 LAB — AEROBIC/ANAEROBIC CULTURE W GRAM STAIN (SURGICAL/DEEP WOUND): Gram Stain: NONE SEEN

## 2023-02-23 LAB — CK: Total CK: 54 U/L (ref 49–397)

## 2023-02-23 MED ORDER — TIZANIDINE HCL 2 MG PO TABS: 2.0000 mg | ORAL_TABLET | Freq: Three times a day (TID) | ORAL | 1 refills | Status: DC | PRN

## 2023-02-23 MED ORDER — TIZANIDINE HCL 4 MG PO TABS
2.0000 mg | ORAL_TABLET | Freq: Three times a day (TID) | ORAL | Status: DC | PRN
  Administered 2023-02-23 – 2023-02-25 (×3): 4 mg via ORAL
  Filled 2023-02-23 (×3): qty 1

## 2023-02-23 MED ORDER — OXYCODONE HCL 10 MG PO TABS: 10.0000 mg | ORAL_TABLET | ORAL | 0 refills | Status: DC | PRN

## 2023-02-23 MED ORDER — POLYETHYLENE GLYCOL 3350 17 G PO PACK: 17.0000 g | PACK | Freq: Two times a day (BID) | ORAL | 0 refills | Status: DC

## 2023-02-23 MED ORDER — ASPIRIN 81 MG PO CHEW: 81.0000 mg | CHEWABLE_TABLET | Freq: Two times a day (BID) | ORAL | 0 refills | Status: AC

## 2023-02-23 NOTE — Progress Notes (Addendum)
   Subjective: 2 Days Post-Op Procedure(s) (LRB): EXCISIONAL TOTAL KNEE ARTHROPLASTY WITH ANTIBIOTIC SPACERS (Right) Patient reports pain as mild.   Patient seen in rounds for Dr. Charlann Boxer. Patient is well, and has had no acute complaints or problems. No acute events overnight. Patient is anxious to get home today. Ambulated well with PT.  We will start therapy today.   Objective: Vital signs in last 24 hours: Temp:  [97.9 F (36.6 C)-98.8 F (37.1 C)] 98 F (36.7 C) (05/23 0620) Pulse Rate:  [62-72] 62 (05/23 0620) Resp:  [16-20] 17 (05/23 0620) BP: (107-135)/(54-69) 135/69 (05/23 0620) SpO2:  [95 %-100 %] 99 % (05/23 0620)  Intake/Output from previous day:  Intake/Output Summary (Last 24 hours) at 02/23/2023 0804 Last data filed at 02/23/2023 0700 Gross per 24 hour  Intake 1944.03 ml  Output 3700 ml  Net -1755.97 ml     Intake/Output this shift: No intake/output data recorded.  Labs: Recent Labs    02/20/23 0845 02/22/23 0333 02/23/23 0307  HGB 13.1 10.3* 9.4*   Recent Labs    02/22/23 0333 02/23/23 0307  WBC 8.9 7.7  RBC 3.68* 3.37*  HCT 32.3* 29.4*  PLT 205 187   Recent Labs    02/20/23 0845 02/22/23 0333  NA 138 136  K 4.1 4.0  CL 105 104  CO2 25 26  BUN 20 13  CREATININE 0.76 0.79  GLUCOSE 97 145*  CALCIUM 9.3 8.7*   No results for input(s): "LABPT", "INR" in the last 72 hours.  Exam: General - Patient is Alert and Oriented Extremity - Neurologically intact Sensation intact distally Intact pulses distally Dorsiflexion/Plantar flexion intact Dressing - dressing C/D/I Motor Function - intact, moving foot and toes well on exam.   Past Medical History:  Diagnosis Date   Arthritis    Bronchitis    Depression    GERD (gastroesophageal reflux disease)    Hypertension    Skin cancer     Assessment/Plan: 2 Days Post-Op Procedure(s) (LRB): EXCISIONAL TOTAL KNEE ARTHROPLASTY WITH ANTIBIOTIC SPACERS (Right) Principal Problem:   Infection of  prosthetic right knee joint (HCC)  Estimated body mass index is 34.09 kg/m as calculated from the following:   Height as of this encounter: 5\' 9"  (1.753 m).   Weight as of this encounter: 104.7 kg. Advance diet Up with therapy D/C IV fluids    DVT Prophylaxis - Aspirin PWB 50% Knee immobilizer when up   He is having difficulty with pain if he moves the knee at all, and I recommended he try 10-15 mg oxycodone. If this is not helpful, we can switch meds.   ID recommends daptomycin at home  We appreciate their assistance PICC placed yesterday    Plan for discharge home today. HHRN to aide wife with abx. Up with PT.  Dennie Bible, PA-C Orthopedic Surgery [phone] 02/23/2023, 8:04 AM

## 2023-02-23 NOTE — Plan of Care (Signed)
  Problem: Education: Goal: Knowledge of the prescribed therapeutic regimen will improve Outcome: Progressing   Problem: Pain Management: Goal: Pain level will decrease with appropriate interventions Outcome: Progressing   Problem: Education: Goal: Knowledge of General Education information will improve Description: Including pain rating scale, medication(s)/side effects and non-pharmacologic comfort measures Outcome: Progressing   

## 2023-02-23 NOTE — Progress Notes (Signed)
PT TX NOTE  02/23/23 1200  PT Visit Information  Last PT Received On 02/23/23  History of Present Illness 72 yo male s/p EXCISIONAL R TOTAL KNEE ARTHROPLASTY WITH articulating ANTIBIOTIC SPACER on 02/21/23; PMH: primary R TKA 10/25/22, I&D and  poly exchange R knee   Pt remains very motivated to mobilize however pain with SPT transfers is 10/10 this pm; do not feel pt is ready for stair training or d/c today given elevated pain level; pain is minimal at rest.  Will continue PT POC and incr mobility as able/stair training as able next PT session. Discussed with RN, pt and wife  Subjective Data  Patient Stated Goal get TKA back after abx  Precautions  Precautions Fall;Knee  Knee Immobilizer - Right On when out of bed or walking  Restrictions  Weight Bearing Restrictions Yes  RLE Weight Bearing PWB  RLE Partial Weight Bearing Percentage or Pounds 50  Pain Assessment  Pain Assessment 0-10  Pain Score 10  Pain Location right knee  Pain Descriptors / Indicators Aching;Burning;Pressure;Sore (shaking)  Pain Intervention(s) Limited activity within patient's tolerance;Monitored during session;Premedicated before session  Cognition  Arousal/Alertness Awake/alert  Behavior During Therapy WFL for tasks assessed/performed  Overall Cognitive Status Within Functional Limits for tasks assessed  Bed Mobility  Overal bed mobility Needs Assistance  Bed Mobility Sit to Supine  Sit to supine Min assist  General bed mobility comments min assist provided d/t pain to progress RLE on to bed, deferred pt self assisting given pain level  Transfers  Overall transfer level Needs assistance  Equipment used Rolling walker (2 wheels)  Transfers Sit to/from Stand;Bed to chair/wheelchair/BSC  Sit to Stand Min guard  Bed to/from chair/wheelchair/BSC transfer type: Step pivot  Step pivot transfers Min guard  General transfer comment cues for RLE position, hand placement  Ambulation/Gait  General Gait Details   (pivotal and lateral steps along EOB only d/t 10/10 pain)  Total Joint Exercises  Ankle Circles/Pumps AROM;Both;10 reps  PT - End of Session  Equipment Utilized During Treatment Gait belt;Right knee immobilizer  Activity Tolerance Patient tolerated treatment well;Patient limited by pain  Patient left in bed;with bed alarm set;with family/visitor present  Nurse Communication Mobility status   PT - Assessment/Plan  PT Plan Current plan remains appropriate  PT Visit Diagnosis Other abnormalities of gait and mobility (R26.89);Difficulty in walking, not elsewhere classified (R26.2)  PT Frequency (ACUTE ONLY) 7X/week  Follow Up Recommendations Follow physician's recommendations for discharge plan and follow up therapies  Assistance recommended at discharge PRN  Patient can return home with the following Assist for transportation;A little help with bathing/dressing/bathroom;Assistance with cooking/housework;Help with stairs or ramp for entrance  PT equipment None recommended by PT  AM-PAC PT "6 Clicks" Mobility Outcome Measure (Version 2)  Help needed turning from your back to your side while in a flat bed without using bedrails? 3  Help needed moving from lying on your back to sitting on the side of a flat bed without using bedrails? 3  Help needed moving to and from a bed to a chair (including a wheelchair)? 3  Help needed standing up from a chair using your arms (e.g., wheelchair or bedside chair)? 3  Help needed to walk in hospital room? 3  Help needed climbing 3-5 steps with a railing?  2  6 Click Score 17  Consider Recommendation of Discharge To: Home with [facility]  Progressive Mobility  What is the highest level of mobility based on the progressive mobility assessment? Level  5 (Walks with assist in room/hall) - Balance while stepping forward/back and can walk in room with assist - Complete  Activity Ambulated with assistance in room  PT Goal Progression  Progress towards PT goals Progressing  toward goals  Acute Rehab PT Goals  PT Goal Formulation With patient  Time For Goal Achievement 03/01/23  Potential to Achieve Goals Good  PT Time Calculation  PT Start Time (ACUTE ONLY) 1240  PT Stop Time (ACUTE ONLY) 1300  PT Time Calculation (min) (ACUTE ONLY) 20 min  PT General Charges  $$ ACUTE PT VISIT 1 Visit  PT Treatments  $Therapeutic Activity 8-22 mins

## 2023-02-23 NOTE — Anesthesia Postprocedure Evaluation (Signed)
Anesthesia Post Note  Patient: [name] [name]  Procedure(s) Performed: EXCISIONAL TOTAL KNEE ARTHROPLASTY WITH ANTIBIOTIC SPACERS (Right: Knee)     Patient location during evaluation: PACU Anesthesia Type: MAC and Spinal Level of consciousness: oriented and awake and alert Pain management: pain level controlled Vital Signs Assessment: post-procedure vital signs reviewed and stable Respiratory status: spontaneous breathing, respiratory function stable and patient connected to nasal cannula oxygen Cardiovascular status: blood pressure returned to baseline and stable Postop Assessment: no headache, no backache and no apparent nausea or vomiting Anesthetic complications: no   No notable events documented.  Last Vitals:  Vitals:   02/22/23 2206 02/23/23 0620  BP: 118/63 135/69  Pulse: 65 62  Resp: 17 17  Temp: 36.6 C 36.7 C  SpO2: 98% 99%    Last Pain:  Vitals:   02/23/23 1113  TempSrc:   PainSc: 4                  Sonny Poth S

## 2023-02-23 NOTE — Consult Note (Addendum)
[facility]    Date of Admission:  02/21/2023   Total days of inpatient antibiotics 1        Reason for Consult: Right PJI    Principal Problem:   Infection of prosthetic right knee joint [facility])   Assessment: 72 year old male admitted with: #Right knee PJI status post  resection of right knee arthroplasty with placement of spacer #History of right knee PJI with MSSA status post I&D on 11/15/2022 -Patient underwent index right total knee replacement on 10/28/2022 this was complicated by infection requiring I&D and poly exchange on 11/15/2022 treated with IV cefazolin x 8 weeks EOT 01/10/2023(initial course extended due to superficial debridement on 12/12/2022) then transition to cefadroxil p.o. twice daily-patient states that his range of motion never really improved following I&D.  States a few days ago he has redness and right knee was swollen admitted for stage I of two-stage treatment - Patient underwent resection of right knee arthroplasty with placement of spacer on 5/21 with Dr. Charlann Boxer, no obvious purulence noted per OR note. Recommendations:  -Patient reports he has been adherent to his cefadroxil.  As such we will switch him to daptomycin for MRSA coverage in case of MRSA.Marland Kitchen start with daptomycin instead of vancomycin given dosing requirement for vancomycin - Follow-up or cultures Microbiology:   Antibiotics: Vancomycin 6/21  Cultures: 5/21 or cultures  HPI: [name] [name] is a 72 y.o. male with past medical history of BPH, MDD, hypertension, followed by infectious disease following right total knee arthroplasty by Dr. Thomasena Edis on 10/25/2022 Supples significant infected requiring I&D and poly exchange on 11/15/2022 cultures growing MSSA treated with cefazolin x 8 weeks EOT 4/9 then transition to cefadroxil 500 mg p.o. twice daily admitted for knee arthroplasty with spacer placement on 5/21 with Dr. Charlann Boxer.  Patient states that he had difficulty with range  of motion following I&D.  States a few days ago knee became swollen as such taken for two-stage surgical with first explantation.  No obvious purulence noted per OR note, cultures obtained.   Review of Systems: Review of Systems  All other systems reviewed and are negative.   Past Medical History:  Diagnosis Date   Arthritis    Bronchitis    Depression    GERD (gastroesophageal reflux disease)    Hypertension    Skin cancer     Social History   Tobacco Use   Smoking status: Never   Smokeless tobacco: Never  Vaping Use   Vaping Use: Never used  Substance Use Topics   Alcohol use: Yes    Comment: socially   Drug use: No    Family History  Problem Relation Age of Onset   Emphysema Mother        heart failure indusce emphysema   Heart disease Father    Heart attack Father    Heart disease Sister 14       [id number]   Scheduled Meds:  ALPRAZolam  1 mg Oral QHS   aspirin  81 mg Oral BID   Chlorhexidine Gluconate Cloth  6 each Topical Daily   docusate sodium  100 mg Oral BID   irbesartan  300 mg Oral Daily   polyethylene glycol  17 g Oral BID   sertraline  100 mg Oral Daily   tamsulosin  0.4 mg Oral QPM   Continuous Infusions:  sodium chloride Stopped (02/22/23 0955)   DAPTOmycin (CUBICIN) 650 mg in sodium chloride 0.9 %  IVPB 650 mg (02/22/23 1741)   methocarbamol (ROBAXIN) IV     PRN Meds:.acetaminophen, bisacodyl, diphenhydrAMINE, HYDROmorphone (DILAUDID) injection, menthol-cetylpyridinium **OR** phenol, methocarbamol **OR** methocarbamol (ROBAXIN) IV, metoCLOPramide **OR** metoCLOPramide (REGLAN) injection, ondansetron **OR** ondansetron (ZOFRAN) IV, oxyCODONE, oxyCODONE, sodium chloride flush Allergies  Allergen Reactions   Avapro [Irbesartan] Cough   Lexapro [Escitalopram Oxalate] Other (See Comments)    Anxiety, insomnia    OBJECTIVE: Blood pressure 118/63, pulse 65, temperature 97.9 F (36.6 C), temperature source Oral, resp. rate 17, height 5\' 9"  (1.753  m), weight 104.7 kg, SpO2 98 %.  Physical Exam Constitutional:      General: He is not in acute distress.    Appearance: He is normal weight. He is not toxic-appearing.  HENT:     Head: Normocephalic and atraumatic.     Right Ear: External ear normal.     Left Ear: External ear normal.     Nose: No congestion or rhinorrhea.     Mouth/Throat:     Mouth: Mucous membranes are moist.     Pharynx: Oropharynx is clear.  Eyes:     Extraocular Movements: Extraocular movements intact.     Conjunctiva/sclera: Conjunctivae normal.     Pupils: Pupils are equal, round, and reactive to light.  Cardiovascular:     Rate and Rhythm: Normal rate and regular rhythm.     Heart sounds: No murmur heard.    No friction rub. No gallop.  Pulmonary:     Effort: Pulmonary effort is normal.     Breath sounds: Normal breath sounds.  Abdominal:     General: Abdomen is flat. Bowel sounds are normal.     Palpations: Abdomen is soft.  Musculoskeletal:        General: No swelling.     Cervical back: Normal range of motion and neck supple.     Comments: Right knee braced  Skin:    General: Skin is warm and dry.  Neurological:     General: No focal deficit present.     Mental Status: He is oriented to person, place, and time.  Psychiatric:        Mood and Affect: Mood normal.     Lab Results Lab Results  Component Value Date   WBC 7.7 02/23/2023   HGB 9.4 (L) 02/23/2023   HCT 29.4 (L) 02/23/2023   MCV 87.2 02/23/2023   PLT 187 02/23/2023    Lab Results  Component Value Date   CREATININE 0.79 02/22/2023   BUN 13 02/22/2023   NA 136 02/22/2023   K 4.0 02/22/2023   CL 104 02/22/2023   CO2 26 02/22/2023    Lab Results  Component Value Date   ALT 22 07/07/2022   AST 17 07/07/2022   ALKPHOS 50 07/07/2022   BILITOT 0.8 07/07/2022       Danelle Earthly, MD [facility] 02/23/2023, 4:25 AM   I have personally spent 85 minutes involved in  face-to-face and non-face-to-face activities for this patient on the day of the visit. Professional time spent includes the following activities: Preparing to see the patient (review of tests), Obtaining and/or reviewing separately obtained history (admission/discharge record), Performing a medically appropriate examination and/or evaluation , Ordering medications/tests/procedures, referring and communicating with other health care professionals, Documenting clinical information in the EMR, Independently interpreting results (not separately reported), Communicating results to the patient/family/caregiver, Counseling and educating the patient/family/caregiver and Care coordination (not separately reported).

## 2023-02-23 NOTE — TOC Progression Note (Signed)
Transition of Care [facility]) - Progression Note   Patient Details  Name: [name] MRN: [id number] Date of Birth: 1951/01/30  Transition of Care [facility]) CM/SW Contact  Ewing Schlein, LCSW Phone Number: 02/23/2023, 2:43 PM  Clinical Narrative: Due to pain control issues and final culture pending, patient will not discharge home today. Patient and wife aware.  Expected Discharge Plan: Home w Home Health Services Barriers to Discharge: Continued Medical Work up  Expected Discharge Plan and Services In-house Referral: Clinical Social Work Post Acute Care Choice: Home Health Living arrangements for the past 2 months: Single Family Home Expected Discharge Date: 02/23/23               DME Arranged: N/A DME Agency: NA HH Arranged: RN, IV Antibiotics HH Agency: [facility] Date [facility] Agency Contacted: 02/22/23 Time HH Agency Contacted: 1419 Representative spoke with at [facility] Agency: [employer] - Jeri Modena;  Frances Furbish - Lorenza Chick  Social Determinants of Health (SDOH) Interventions SDOH Screenings   Food Insecurity: No Food Insecurity (02/21/2023)  Housing: Low Risk  (02/21/2023)  Transportation Needs: No Transportation Needs (02/21/2023)  Utilities: Not At Risk (02/21/2023)  Depression (PHQ2-9): Low Risk  (07/14/2022)  Tobacco Use: Low Risk  (02/22/2023)   Readmission Risk Interventions    02/22/2023    2:16 PM  Readmission Risk Prevention Plan  Post Dischage Appt Complete  Medication Screening Complete  Transportation Screening Complete

## 2023-02-23 NOTE — Progress Notes (Signed)
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   Recommendations for follow up therapy are one component of a multi-disciplinary discharge planning process, led by the attending physician.  Recommendations may be updated based on patient status, additional functional criteria and insurance authorization.  Follow Up Recommendations       Assistance Recommended at Discharge PRN  Patient can return home with the following Assist for transportation;A little help with bathing/dressing/bathroom;Assistance with cooking/housework;Help with stairs or ramp for entrance   Equipment Recommendations  None recommended by PT    Recommendations for Other Services       Precautions / Restrictions Precautions Precautions: Fall;Knee Knee Immobilizer - Right: On when out of bed or walking Restrictions Weight Bearing Restrictions: Yes RLE Weight Bearing: Partial weight bearing RLE Partial Weight Bearing Percentage or Pounds: 50     Mobility  Bed Mobility Overal bed mobility: Needs Assistance Bed Mobility: Supine to Sit     Supine to sit: Min assist     General bed mobility comments: min assist to progress RLE off and on to bed, min/guard for safety;    Transfers Overall transfer level: Needs assistance Equipment used: Rolling walker (2 wheels) Transfers: Sit to/from Stand Sit to Stand: Min guard           General transfer comment: cues for RLE position, hand placement    Ambulation/Gait Ambulation/Gait assistance: Min guard Gait Distance (Feet): 60  Feet Assistive device: Rolling walker (2 wheels) Gait Pattern/deviations: Step-to pattern       General Gait Details: cues for sequence, PWB, good adherence and good stability; distance limtied by pain; 2 brief standing rests d/t pain and UE fatigue   Stairs             Wheelchair Mobility    Modified Rankin (Stroke Patients Only)       Balance                                            Cognition Arousal/Alertness: Awake/alert Behavior During Therapy: WFL for tasks assessed/performed Overall Cognitive Status: Within Functional Limits for tasks assessed                                          Exercises Total Joint Exercises Ankle Circles/Pumps: AROM, Both, 10 reps Heel Slides: AAROM, Right, 5 reps, Limitations Heel Slides Limitations: pain    General Comments        Pertinent Vitals/Pain Pain Assessment Pain Assessment: Faces Faces Pain Scale: Hurts worst Pain Location: right knee Pain Descriptors / Indicators: Aching, Burning, Pressure, Sore (shaking) Pain Intervention(s): Limited activity within patient's tolerance, Monitored during session, Premedicated before session, Repositioned, Ice applied    Home Living                          Prior Function  PT Goals (current goals can now be found in the care plan section) Acute Rehab PT Goals Patient Stated Goal: get TKA back after abx PT Goal Formulation: With patient Time For Goal Achievement: 03/01/23 Potential to Achieve Goals: Good Progress towards PT goals: Progressing toward goals    Frequency    7X/week      PT Plan Current plan remains appropriate    Co-evaluation              AM-PAC PT "6 Clicks" Mobility   Outcome Measure  Help needed turning from your back to your side while in a flat bed without using bedrails?: A Little Help needed moving from lying on your back to sitting on the side of a flat bed without using  bedrails?: A Little Help needed moving to and from a bed to a chair (including a wheelchair)?: A Little Help needed standing up from a chair using your arms (e.g., wheelchair or bedside chair)?: A Little Help needed to walk in hospital room?: A Little Help needed climbing 3-5 steps with a railing? : A Lot 6 Click Score: 17    End of Session Equipment Utilized During Treatment: Gait belt;Right knee immobilizer Activity Tolerance: Patient tolerated treatment well Patient left: in chair;with call bell/phone within reach;with chair alarm set   PT Visit Diagnosis: Other abnormalities of gait and mobility (R26.89);Difficulty in walking, not elsewhere classified (R26.2)     Time: 1610-9604 PT Time Calculation (min) (ACUTE ONLY): 30 min  Charges:  $Gait Training: 8-22 mins $Therapeutic Activity: 8-22 mins                     Delice Bison, PT  Acute Rehab Dept [facility]) [phone]  02/23/2023    [facility] 02/23/2023, 11:34 AM

## 2023-02-24 DIAGNOSIS — T8453XA Infection and inflammatory reaction due to internal right knee prosthesis, initial encounter: Secondary | ICD-10-CM | POA: Diagnosis not present

## 2023-02-24 LAB — AEROBIC/ANAEROBIC CULTURE W GRAM STAIN (SURGICAL/DEEP WOUND)

## 2023-02-24 MED ORDER — CEFAZOLIN IV (FOR PTA / DISCHARGE USE ONLY): 2.0000 g | Freq: Three times a day (TID) | INTRAVENOUS | 0 refills | Status: AC

## 2023-02-24 MED ORDER — SODIUM CHLORIDE 0.9 % IV BOLUS
500.0000 mL | Freq: Once | INTRAVENOUS | Status: AC
  Administered 2023-02-24: 500 mL via INTRAVENOUS

## 2023-02-24 MED ORDER — KETOROLAC TROMETHAMINE 15 MG/ML IJ SOLN
7.5000 mg | Freq: Four times a day (QID) | INTRAMUSCULAR | Status: AC
  Administered 2023-02-24 (×2): 7.5 mg via INTRAVENOUS
  Filled 2023-02-24 (×2): qty 1

## 2023-02-24 MED ORDER — CELECOXIB 200 MG PO CAPS: 200.0000 mg | ORAL_CAPSULE | Freq: Two times a day (BID) | ORAL | 0 refills | Status: AC

## 2023-02-24 NOTE — TOC Transition Note (Signed)
Transition of Care [facility]) - CM/SW Discharge Note   Patient Details  Name: [name] [name] MRN: [id number] Date of Birth: 1951-07-17  Transition of Care [facility]) CM/SW Contact:  Amada Jupiter, LCSW Phone Number: 02/24/2023, 3:49 PM   Clinical Narrative:     Hopeful pt will be medically ready for dc tomorrow.  HHRN arranged already with [facility] and IV abx being supplied by [facility].  Teaching has been completed. No further TOC needs.  Final next level of care: Home w Home Health Services Barriers to Discharge: Barriers Resolved   Patient Goals and CMS Choice      Discharge Placement                         Discharge Plan and Services Additional resources added to the After Visit Summary for   In-house Referral: Clinical Social Work   Post Acute Care Choice: Home Health          DME Arranged: N/A DME Agency: NA       HH Arranged: RN, IV Antibiotics HH Agency: [facility] Date [facility] Agency Contacted: 02/22/23 Time HH Agency Contacted: 1419 Representative spoke with at [facility] Agency: [facility] - Jeri Modena;  Frances Furbish - Lorenza Chick  Social Determinants of Health (SDOH) Interventions SDOH Screenings   Food Insecurity: No Food Insecurity (02/21/2023)  Housing: Low Risk  (02/21/2023)  Transportation Needs: No Transportation Needs (02/21/2023)  Utilities: Not At Risk (02/21/2023)  Depression (PHQ2-9): Low Risk  (07/14/2022)  Tobacco Use: Low Risk  (02/23/2023)     Readmission Risk Interventions    02/22/2023    2:16 PM  Readmission Risk Prevention Plan  Post Dischage Appt Complete  Medication Screening Complete  Transportation Screening Complete

## 2023-02-24 NOTE — Progress Notes (Signed)
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  OOB deferred at pt request 2* fatigue    Recommendations for follow up therapy are one component of a multi-disciplinary discharge planning process, led by the attending physician.  Recommendations may be updated based on patient status, additional functional criteria and insurance authorization.  Follow Up Recommendations       Assistance Recommended at Discharge PRN  Patient can return home with the following Assist for transportation;A little help with bathing/dressing/bathroom;Assistance with cooking/housework;Help with stairs or ramp for entrance   Equipment Recommendations  None recommended by PT    Recommendations for Other Services       Precautions / Restrictions Precautions Precautions: Fall;Knee Required Braces or Orthoses: Knee Immobilizer - Right Knee Immobilizer - Right: On when out of bed or walking Restrictions Weight Bearing Restrictions: Yes RLE Weight Bearing: Partial weight bearing RLE Partial Weight Bearing Percentage or Pounds: 50     Mobility  Bed Mobility Overal bed mobility: Needs Assistance Bed Mobility: Sit to Supine       Sit to supine: Min assist   General bed mobility comments: min assist provided d/t pain to progress RLE on to bed, deferred pt self assisting given pain level    Transfers Overall transfer level: Needs assistance Equipment used: Rolling walker (2 wheels) Transfers: Sit to/from Stand Sit to Stand: Min guard           General transfer comment: cues for RLE position, hand placement     Ambulation/Gait Ambulation/Gait assistance: Min guard Gait Distance (Feet): 60 Feet Assistive device: Rolling walker (2 wheels) Gait Pattern/deviations: Step-to pattern Gait velocity: decr     General Gait Details: cues for sequence, PWB, good adherence and good stability; distance limtied by pain/fatigue with 2 standing rest breaks required for task completion   Stairs Stairs: Yes Stairs assistance: Min assist Stair Management: Two rails, Step to pattern, Forwards Number of Stairs: 5 General stair comments: min cues for sequence   Wheelchair Mobility    Modified Rankin (Stroke Patients Only)       Balance Overall balance assessment: Needs assistance Sitting-balance support: No upper extremity supported, Feet supported Sitting balance-Leahy Scale: Good     Standing balance support: Single extremity supported Standing balance-Leahy Scale: Poor                              Cognition Arousal/Alertness: Awake/alert Behavior During Therapy: WFL for tasks assessed/performed Overall Cognitive Status: Within Functional Limits for tasks assessed                                          Exercises Total Joint Exercises Ankle Circles/Pumps: AROM, Both, 15 reps, Supine Quad Sets: AROM, Both, 10 reps, Supine Heel Slides: AAROM, 10 reps, Supine, Right Hip ABduction/ADduction: AAROM, Right, 10 reps, Supine Straight Leg Raises: AAROM, Right, 10 reps, Supine    General Comments        Pertinent Vitals/Pain Pain Assessment Pain Assessment: 0-10 Pain Score: 7  Pain Location: right knee with activity Pain Descriptors / Indicators: Aching, Burning, Pressure, Sore Pain Intervention(s): Limited activity within patient's tolerance, Monitored during session, Premedicated before session, Ice applied    Home Living                          Prior Function            PT Goals (current goals can now be found in the care plan section)  Acute Rehab PT Goals Patient Stated Goal: get TKA back after abx PT Goal Formulation: With patient Time For Goal Achievement: 03/01/23 Potential to Achieve Goals: Good Progress towards PT goals: Progressing toward goals    Frequency    7X/week      PT Plan Current plan remains appropriate    Co-evaluation              AM-PAC PT "6 Clicks" Mobility   Outcome Measure  Help needed turning from your back to your side while in a flat bed without using bedrails?: A Little Help needed moving from lying on your back to sitting on the side of a flat bed without using bedrails?: A Little Help needed moving to and from a bed to a chair (including a wheelchair)?: A Little Help needed standing up from a chair using your arms (e.g., wheelchair or bedside chair)?: A Little Help needed to walk in hospital room?: A Little Help needed climbing 3-5 steps with a railing? : A Little 6 Click Score: 18    End of Session Equipment Utilized During Treatment: Right knee immobilizer Activity Tolerance: Patient tolerated treatment well;Patient limited by pain Patient left: in bed;with bed alarm set   PT Visit Diagnosis: Other abnormalities of gait and mobility (R26.89);Difficulty in walking, not elsewhere classified (R26.2)     Time: 1550-1620 PT Time Calculation (min) (ACUTE ONLY): 30 min  Charges:  $Therapeutic Exercise: 23-37 mins                     Mauro Kaufmann PT Acute Rehabilitation Services Pager [phone] Office [phone]    [facility] 02/24/2023, 4:28 PM

## 2023-02-24 NOTE — Progress Notes (Signed)
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  Pt up to ambulate limited distance in hall and negotiated stairs with bil rails.  Pt hopeful for dc home tomorrow.   Recommendations for follow up therapy are one component of a multi-disciplinary discharge planning process, led by the attending physician.  Recommendations may be updated based on patient status, additional functional criteria and insurance authorization.  Follow Up Recommendations       Assistance Recommended at Discharge PRN  Patient can return home with the following Assist for transportation;A little help with bathing/dressing/bathroom;Assistance with cooking/housework;Help with stairs or ramp for entrance   Equipment Recommendations  None recommended by PT    Recommendations for Other Services       Precautions / Restrictions Precautions Precautions: Fall;Knee Required Braces or Orthoses: Knee Immobilizer - Right Knee Immobilizer - Right: On when out of bed or walking Restrictions Weight Bearing Restrictions: Yes RLE Weight Bearing: Partial weight bearing RLE Partial Weight Bearing Percentage or Pounds: 50     Mobility  Bed Mobility Overal bed mobility: Needs Assistance Bed Mobility: Sit to Supine       Sit to supine: Min assist   General bed mobility comments: min assist provided d/t pain to progress RLE on to bed, deferred pt self assisting given pain level    Transfers Overall transfer level: Needs assistance Equipment used: Rolling walker (2 wheels) Transfers: Sit to/from Stand Sit to Stand: Min guard           General transfer comment: cues for RLE  position, hand placement    Ambulation/Gait Ambulation/Gait assistance: Min guard Gait Distance (Feet): 60 Feet Assistive device: Rolling walker (2 wheels) Gait Pattern/deviations: Step-to pattern Gait velocity: decr     General Gait Details: cues for sequence, PWB, good adherence and good stability; distance limtied by pain/fatigue with 2 standing rest breaks required for task completion   Stairs Stairs: Yes Stairs assistance: Min assist Stair Management: Two rails, Step to pattern, Forwards Number of Stairs: 5 General stair comments: min cues for sequence   Wheelchair Mobility    Modified Rankin (Stroke Patients Only)       Balance Overall balance assessment: Needs assistance Sitting-balance support: No upper extremity supported, Feet supported Sitting balance-Leahy Scale: Good     Standing balance support: Single extremity supported Standing balance-Leahy Scale: Poor                              Cognition Arousal/Alertness: Awake/alert Behavior During Therapy: WFL for tasks assessed/performed Overall Cognitive Status: Within Functional Limits for tasks assessed                                          Exercises      General Comments        Pertinent Vitals/Pain Pain Assessment Pain Assessment: 0-10 Pain Score: 7  Pain Location: right knee Pain Descriptors / Indicators: Aching, Burning, Pressure, Sore Pain Intervention(s): Limited activity within patient's tolerance, Monitored during session, Premedicated before session, Ice  applied    Home Living                          Prior Function            PT Goals (current goals can now be found in the care plan section) Acute Rehab PT Goals Patient Stated Goal: get TKA back after abx PT Goal Formulation: With patient Time For Goal Achievement: 03/01/23 Potential to Achieve Goals: Good Progress towards PT goals: Progressing toward goals    Frequency     7X/week      PT Plan Current plan remains appropriate    Co-evaluation              AM-PAC PT "6 Clicks" Mobility   Outcome Measure  Help needed turning from your back to your side while in a flat bed without using bedrails?: A Little Help needed moving from lying on your back to sitting on the side of a flat bed without using bedrails?: A Little Help needed moving to and from a bed to a chair (including a wheelchair)?: A Little Help needed standing up from a chair using your arms (e.g., wheelchair or bedside chair)?: A Little Help needed to walk in hospital room?: A Little Help needed climbing 3-5 steps with a railing? : A Little 6 Click Score: 18    End of Session Equipment Utilized During Treatment: Gait belt;Right knee immobilizer Activity Tolerance: Patient tolerated treatment well;Patient limited by pain Patient left: in bed;with bed alarm set;with family/visitor present Nurse Communication: Mobility status PT Visit Diagnosis: Other abnormalities of gait and mobility (R26.89);Difficulty in walking, not elsewhere classified (R26.2)     Time: 1111-1140 PT Time Calculation (min) (ACUTE ONLY): 29 min  Charges:  $Gait Training: 8-22 mins $Therapeutic Activity: 8-22 mins                     Mauro Kaufmann PT Acute Rehabilitation Services Pager [phone] Office [phone]    Kaprice Kage 02/24/2023, 1:00 PM

## 2023-02-24 NOTE — Progress Notes (Addendum)
[facility]  Date of Admission:  02/21/2023   Total days of inpatient antibiotics 3  Principal Problem:   Infection of prosthetic right knee joint [facility])          Assessment: 72 year old male admitted with:  #Right knee PJI status post Tesection of right knee arthroplasty with placement of spacer #History of right knee PJI with MSSA status post I&D on 11/15/2022 -Patient underwent index right total knee replacement on 10/28/2022 this was complicated by infection requiring I&D and poly exchange on 11/15/2022 treated with IV cefazolin x 8 weeks EOT 01/10/2023(initial course extended due to superficial debridement on 12/12/2022) then transition to cefadroxil p.o. twice daily-patient states that his range of motion never really improved following I&D.  States a few days ago he has redness and right knee was swollen admitted for stage I of two-stage treatment - Patient underwent resection of right knee arthroplasty with placement of spacer on 5/21 with Dr. Charlann Boxer, no obvious purulence noted per OR note. Started on dapto given he was adherent to cefadroxil suppression.  Recommendations:  -Start cefazolin, plan on 6 weeks from OR. D/C daptomycin. Target piror MSSA. -Follow OR cx -ID will follow peripherally  OPAT ORDERS:  Diagnosis: PHI  Culture Result: NG  Allergies  Allergen Reactions   Avapro [Irbesartan] Cough   Lexapro [Escitalopram Oxalate] Other (See Comments)    Anxiety, insomnia     Discharge antibiotics to be given via PICC line:  Per pharmacy protocol cefazolin 2g q8h    Duration: 6 weeks End Date: 04/04/23  [facility] Care Per Protocol with [employer] Use: Home health RN for IV administration and teaching, line care and labs.    Labs weekly while on IV antibiotics: __x CBC with differential __ BMP **TWICE WEEKLY ON VANCOMYCIN  __ CMP _x_ CRP _x_ ESR __ Vancomycin trough TWICE WEEKLY __ CK  __ Please pull PIC at completion of IV  antibiotics __x Please leave PIC in place until doctor has seen patient or been notified  Fax weekly labs to [phone]  Clinic Follow Up Appt: 6/5  @ [facility] with Dr. Thedore Mins    Microbiology:   Antibiotics: Vancomycin 6/21   Cultures: 5/21 or cultures NG x 2 days   SUBJECTIVE: Sitting in chair, wife at bedside Interval: Afebrile overnight  Review of Systems: Review of Systems  All other systems reviewed and are negative.    Scheduled Meds:  ALPRAZolam  1 mg Oral QHS   aspirin  81 mg Oral BID   Chlorhexidine Gluconate Cloth  6 each Topical Daily   docusate sodium  100 mg Oral BID   irbesartan  300 mg Oral Daily   ketorolac  7.5 mg Intravenous Q6H   polyethylene glycol  17 g Oral BID   sertraline  100 mg Oral Daily   tamsulosin  0.4 mg Oral QPM   Continuous Infusions:  sodium chloride Stopped (02/22/23 0955)   DAPTOmycin (CUBICIN) 650 mg in sodium chloride 0.9 % IVPB 650 mg (02/23/23 1958)   PRN Meds:.acetaminophen, bisacodyl, diphenhydrAMINE, HYDROmorphone (DILAUDID) injection, menthol-cetylpyridinium **OR** phenol, metoCLOPramide **OR** metoCLOPramide (REGLAN) injection, ondansetron **OR** ondansetron (ZOFRAN) IV, oxyCODONE, oxyCODONE, sodium chloride flush, tiZANidine Allergies  Allergen Reactions   Avapro [Irbesartan] Cough   Lexapro [Escitalopram Oxalate] Other (See Comments)    Anxiety, insomnia    OBJECTIVE: Vitals:   02/23/23 2148 02/24/23 0530 02/24/23 1334 02/24/23 1620  BP: (!) 101/53 130/66 (!) 85/48 103/60  Pulse: 60 60  68   Resp: 15 16 18    Temp: 98.4 F (36.9 C) 98.3 F (36.8 C) 98.5 F (36.9 C)   TempSrc: Oral  Oral   SpO2: 96% 100% 98%   Weight:      Height:       Body mass index is 34.09 kg/m.  Physical Exam Constitutional:      General: He is not in acute distress.    Appearance: He is normal weight. He is not toxic-appearing.  HENT:     Head: Normocephalic and atraumatic.     Right Ear: External ear normal.     Left Ear:  External ear normal.     Nose: No congestion or rhinorrhea.     Mouth/Throat:     Mouth: Mucous membranes are moist.     Pharynx: Oropharynx is clear.  Eyes:     Extraocular Movements: Extraocular movements intact.     Conjunctiva/sclera: Conjunctivae normal.     Pupils: Pupils are equal, round, and reactive to light.  Cardiovascular:     Rate and Rhythm: Normal rate and regular rhythm.     Heart sounds: No murmur heard.    No friction rub. No gallop.  Pulmonary:     Effort: Pulmonary effort is normal.     Breath sounds: Normal breath sounds.  Abdominal:     General: Abdomen is flat. Bowel sounds are normal.     Palpations: Abdomen is soft.  Musculoskeletal:        General: No swelling. Normal range of motion.     Cervical back: Normal range of motion and neck supple.  Skin:    General: Skin is warm and dry.  Neurological:     General: No focal deficit present.     Mental Status: He is oriented to person, place, and time.  Psychiatric:        Mood and Affect: Mood normal.       Lab Results Lab Results  Component Value Date   WBC 7.7 02/23/2023   HGB 9.4 (L) 02/23/2023   HCT 29.4 (L) 02/23/2023   MCV 87.2 02/23/2023   PLT 187 02/23/2023    Lab Results  Component Value Date   CREATININE 0.79 02/22/2023   BUN 13 02/22/2023   NA 136 02/22/2023   K 4.0 02/22/2023   CL 104 02/22/2023   CO2 26 02/22/2023    Lab Results  Component Value Date   ALT 22 07/07/2022   AST 17 07/07/2022   ALKPHOS 50 07/07/2022   BILITOT 0.8 07/07/2022        Danelle Earthly, MD [facility] 02/24/2023, 4:59 PM   I have personally spent 53 minutes involved in face-to-face and non-face-to-face activities for this patient on the day of the visit. Professional time spent includes the following activities: Preparing to see the patient (review of tests), Obtaining and/or reviewing separately obtained history (admission/discharge record),  Performing a medically appropriate examination and/or evaluation , Ordering medications/tests/procedures, referring and communicating with other health care professionals, Documenting clinical information in the EMR, Independently interpreting results (not separately reported), Communicating results to the patient/family/caregiver, Counseling and educating the patient/family/caregiver and Care coordination (not separately reported).

## 2023-02-24 NOTE — Progress Notes (Signed)
Patient ID: [name] [name], male   DOB: [birth year], 72 y.o.   MRN: [id number] Subjective: 3 Days Post-Op Procedure(s) (LRB): EXCISIONAL TOTAL KNEE ARTHROPLASTY WITH ANTIBIOTIC SPACERS (Right)    Patient reports pain as moderate, worse with activity - as unfortunately to be expected Awaiting final recs for anitibiotics at home, currently on Daptomycin  Objective:   VITALS:   Vitals:   02/23/23 2148 02/24/23 0530  BP: (!) 101/53 130/66  Pulse: 60 60  Resp: 15 16  Temp: 98.4 F (36.9 C) 98.3 F (36.8 C)  SpO2: 96% 100%    Neurovascular intact Incision: dressing C/D/I  LABS Recent Labs    02/22/23 0333 02/23/23 0307  HGB 10.3* 9.4*  HCT 32.3* 29.4*  WBC 8.9 7.7  PLT 205 187    Recent Labs    02/22/23 0333  NA 136  K 4.0  BUN 13  CREATININE 0.79  GLUCOSE 145*    No results for input(s): "LABPT", "INR" in the last 72 hours.   Assessment/Plan: 3 Days Post-Op Procedure(s) (LRB): EXCISIONAL TOTAL KNEE ARTHROPLASTY WITH ANTIBIOTIC SPACERS (Right)   Up with therapy Continue ABX therapy due to infected right total knee replacement status post excision D/C today once final antibiotics selected Celebrex for pain support

## 2023-02-25 DIAGNOSIS — T8453XA Infection and inflammatory reaction due to internal right knee prosthesis, initial encounter: Secondary | ICD-10-CM | POA: Diagnosis not present

## 2023-02-25 LAB — AEROBIC/ANAEROBIC CULTURE W GRAM STAIN (SURGICAL/DEEP WOUND)

## 2023-02-25 MED ORDER — HEPARIN SOD (PORK) LOCK FLUSH 100 UNIT/ML IV SOLN
250.0000 [IU] | INTRAVENOUS | Status: AC | PRN
  Administered 2023-02-25: 250 [IU]

## 2023-02-25 NOTE — Discharge Summary (Signed)
In most cases prophylactic antibiotics for Dental procdeures after total joint surgery are not necessary.  Exceptions are as follows:  1. History of prior total joint infection  2. Severely immunocompromised (Organ Transplant, cancer chemotherapy, Rheumatoid biologic meds such as Humera)  3. Poorly controlled diabetes (A1C &gt; 8.0, blood glucose over 200)  If you have one of these conditions, contact your surgeon for an antibiotic prescription, prior to your dental procedure. Orthopedic Discharge Summary        Physician Discharge Summary  Patient ID: [name] [name] MRN: [id number] DOB/AGE: Feb 14, 1951 72 y.o.  Admit date: 02/21/2023 Discharge date: 02/25/2023   Procedures:  Procedure(s) (LRB): EXCISIONAL TOTAL KNEE ARTHROPLASTY WITH ANTIBIOTIC SPACERS (Right)  Attending Physician:  Dr. Charlann Boxer  Admission Diagnoses:   right knee infection  Discharge Diagnoses:  right knee infection   Past Medical History:  Diagnosis Date   Arthritis    Bronchitis    Depression    GERD (gastroesophageal reflux disease)    Hypertension    Skin cancer     PCP: Myrlene Broker, MD   Discharged Condition: good  Hospital Course:  Patient underwent the above stated procedure on 02/21/2023. Patient tolerated the procedure well and brought to the recovery room in good condition and subsequently to the floor. Patient had an uncomplicated hospital course and was stable for discharge.   Disposition: Discharge disposition: 01-Home or Self Care      with follow up in 2 weeks    Follow-up Information     Durene Romans, MD. Schedule an appointment as soon as possible for a visit in 2 week(s).   Specialty: Orthopedic Surgery Contact information: [address] [place] [address] [phone]         [facility] Follow up.   Specialty: Home Health Services Why: to provide home health nursing visits Contact information: [address] [place] [address] [phone]         [facility] Follow up.   Why: [facility] will provide IV antibiotics in the home.                Dental Antibiotics:  In most cases prophylactic antibiotics for Dental procdeures after total joint surgery are not necessary.  Exceptions are as follows:  1. History of prior total joint infection  2. Severely immunocompromised (Organ Transplant, cancer chemotherapy, Rheumatoid biologic meds such as Humera)  3. Poorly controlled diabetes (A1C &gt; 8.0, blood glucose over 200)  If you have one of these conditions, contact your surgeon for an antibiotic prescription, prior to your dental procedure.  Discharge Instructions     [facility] pharmacist to adjust dose for Vancomycin, Aminoglycosides and other anti-infective therapies as requested by physician.   Complete by: As directed    [facility] to provide Cath Flo 2mg    Complete by: As directed    Administer for PICC line occlusion and as ordered by physician for other access device issues.   Anaphylaxis Kit: Provided to treat any anaphylactic reaction to the medication being provided to the patient if First Dose or when requested by physician   Complete by: As directed    Epinephrine 1mg /ml vial / amp: Administer 0.3mg  (0.44ml) subcutaneously once for moderate to severe anaphylaxis, nurse to call physician and pharmacy when reaction occurs and call 911 if needed for immediate care   Diphenhydramine 50mg /ml IV vial: Administer 25-50mg  IV/IM PRN for first dose reaction, rash, itching, mild reaction, nurse to  call physician and pharmacy when reaction occurs   Sodium Chloride 0.9% NS IV: Administer if needed for hypovolemic blood pressure drop or as ordered by physician after call to physician with anaphylactic reaction   Call MD / Call 911   Complete by: As directed    If you experience chest pain or shortness of breath, CALL 911 and be transported  to the hospital emergency room.  If you develope a fever above 101 F, pus (white drainage) or increased drainage or redness at the wound, or calf pain, call your surgeon's office.   Call MD / Call 911   Complete by: As directed    If you experience chest pain or shortness of breath, CALL 911 and be transported to the hospital emergency room.  If you develope a fever above 101 F, pus (white drainage) or increased drainage or redness at the wound, or calf pain, call your surgeon's office.   Change dressing   Complete by: As directed    Maintain surgical dressing until follow up in the clinic. If the edges start to pull up, may reinforce with tape. If the dressing is no longer working, may remove and cover with gauze and tape, but must keep the area dry and clean.  Call with any questions or concerns.   Change dressing on IV access line weekly and PRN   Complete by: As directed    Constipation Prevention   Complete by: As directed    Drink plenty of fluids.  Prune juice may be helpful.  You may use a stool softener, such as Colace (over the counter) 100 mg twice a day.  Use MiraLax (over the counter) for constipation as needed.   Constipation Prevention   Complete by: As directed    Drink plenty of fluids.  Prune juice may be helpful.  You may use a stool softener, such as Colace (over the counter) 100 mg twice a day.  Use MiraLax (over the counter) for constipation as needed.   Diet - low sodium heart healthy   Complete by: As directed    Diet - low sodium heart healthy   Complete by: As directed    Flush IV access with Sodium Chloride 0.9% and Heparin 10 units/ml or 100 units/ml   Complete by: As directed    Home infusion instructions - [facility]   Complete by: As directed    Instructions: Flush IV access with Sodium Chloride 0.9% and Heparin 10units/ml or 100units/ml   Change dressing on IV access line: Weekly and PRN   Instructions Cath Flo 2mg : Administer for PICC Line  occlusion and as ordered by physician for other access device   [facility] pharmacist to adjust dose for: Vancomycin, Aminoglycosides and other anti-infective therapies as requested by physician   Increase activity slowly as tolerated   Complete by: As directed    Partial Weight bearing   Increase activity slowly as tolerated   Complete by: As directed    Method of administration may be changed at the discretion of home infusion pharmacist based upon assessment of the patient and/or caregiver's ability to self-administer the medication ordered   Complete by: As directed    Post-operative opioid taper instructions:   Complete by: As directed    POST-OPERATIVE OPIOID TAPER INSTRUCTIONS: It is important to wean off of your opioid medication as soon as possible. If you do not need pain medication after your surgery it is ok to stop day one. Opioids include:  Codeine, Hydrocodone(Norco, Vicodin), Oxycodone(Percocet, oxycontin) and hydromorphone amongst others.  Long term and even short term use of opiods can cause: Increased pain response Dependence Constipation Depression Respiratory depression And more.  Withdrawal symptoms can include Flu like symptoms Nausea, vomiting And more Techniques to manage these symptoms Hydrate well Eat regular healthy meals Stay active Use relaxation techniques(deep breathing, meditating, yoga) Do Not substitute Alcohol to help with tapering If you have been on opioids for less than two weeks and do not have pain than it is ok to stop all together.  Plan to wean off of opioids This plan should start within one week post op of your joint replacement. Maintain the same interval or time between taking each dose and first decrease the dose.  Cut the total daily intake of opioids by one tablet each day Next start to increase the time between doses. The last dose that should be eliminated is the evening dose.      Post-operative opioid taper  instructions:   Complete by: As directed    POST-OPERATIVE OPIOID TAPER INSTRUCTIONS: It is important to wean off of your opioid medication as soon as possible. If you do not need pain medication after your surgery it is ok to stop day one. Opioids include: Codeine, Hydrocodone(Norco, Vicodin), Oxycodone(Percocet, oxycontin) and hydromorphone amongst others.  Long term and even short term use of opiods can cause: Increased pain response Dependence Constipation Depression Respiratory depression And more.  Withdrawal symptoms can include Flu like symptoms Nausea, vomiting And more Techniques to manage these symptoms Hydrate well Eat regular healthy meals Stay active Use relaxation techniques(deep breathing, meditating, yoga) Do Not substitute Alcohol to help with tapering If you have been on opioids for less than two weeks and do not have pain than it is ok to stop all together.  Plan to wean off of opioids This plan should start within one week post op of your joint replacement. Maintain the same interval or time between taking each dose and first decrease the dose.  Cut the total daily intake of opioids by one tablet each day Next start to increase the time between doses. The last dose that should be eliminated is the evening dose.      TED hose   Complete by: As directed    Use stockings (TED hose) for 2 weeks on both leg(s).  You may remove them at night for sleeping.       Allergies as of 02/25/2023       Reactions   Avapro [irbesartan] Cough   Lexapro [escitalopram Oxalate] Other (See Comments)   Anxiety, insomnia        Medication List     STOP taking these medications    cefadroxil 500 MG capsule Commonly known as: DURICEF   diclofenac 75 MG EC tablet Commonly known as: VOLTAREN   methocarbamol 500 MG tablet Commonly known as: ROBAXIN   naproxen 500 MG tablet Commonly known as: NAPROSYN   polyethylene glycol powder 17 GM/SCOOP powder Commonly  known as: GLYCOLAX/MIRALAX Replaced by: polyethylene glycol 17 g packet   traMADol 50 MG tablet Commonly known as: Ultram       TAKE these medications    ALLERGY EYE DROPS OP Place 1 drop into both eyes daily as needed (red/itchy eyes).   ALPRAZolam 1 MG tablet Commonly known as: XANAX Take 1 mg by mouth at bedtime.   APPLE CIDER VINEGAR PO Take 1 capsule by mouth daily.   aspirin 81 MG chewable tablet Chew  1 tablet (81 mg total) by mouth 2 (two) times daily for 28 days.   calcium carbonate 500 MG chewable tablet Commonly known as: TUMS - dosed in mg elemental calcium Chew 2 tablets by mouth 2 (two) times daily as needed for indigestion or heartburn.   ceFAZolin  IVPB Commonly known as: ANCEF Inject 2 g into the vein every 8 (eight) hours. Indication:  R-knee PJI First Dose: No - but has received previously Last Day of Therapy:  04/04/23 Labs - Once weekly:  CBC/D and BMP, ESR and CRP Method of administration: IV Push Pull PICC line at the completion of IV antibiotics Method of administration may be changed at the discretion of home infusion pharmacist based upon assessment of the patient and/or caregiver's ability to self-administer the medication ordered.   celecoxib 200 MG capsule Commonly known as: CeleBREX Take 1 capsule (200 mg total) by mouth 2 (two) times daily.   Fish Oil 1200 MG Caps Take 1,200 mg by mouth daily.   Magnesium 500 MG Tabs Take 500 mg by mouth daily.   MULTI-VITAMIN PO Take 1 tablet by mouth daily.   Oxycodone HCl 10 MG Tabs Take 1-1.5 tablets (10-15 mg total) by mouth every 4 (four) hours as needed for severe pain. What changed:  medication strength how much to take   polyethylene glycol 17 g packet Commonly known as: MIRALAX / GLYCOLAX Take 17 g by mouth 2 (two) times daily. Replaces: polyethylene glycol powder 17 GM/SCOOP powder   sertraline 100 MG tablet Commonly known as: ZOLOFT TAKE 1 TABLET BY MOUTH EVERY DAY   sodium  chloride 0.65 % Soln nasal spray Commonly known as: OCEAN Place 1 spray into both nostrils as needed for congestion.   tamsulosin 0.4 MG Caps capsule Commonly known as: FLOMAX Take 0.4 mg by mouth every evening.   telmisartan 80 MG tablet Commonly known as: MICARDIS TAKE 1 TABLET BY MOUTH EVERY DAY   tiZANidine 2 MG tablet Commonly known as: ZANAFLEX Take 1-2 tablets (2-4 mg total) by mouth every 8 (eight) hours as needed for muscle spasms (muscle pain).   Vitamin D2 50 MCG (2000 UT) Tabs Take 2,000 Units by mouth daily.   Vitamin E 670 MG (1000 UT) Caps Take 670 mg by mouth daily.   Zinc 30 MG Tabs Take 30 mg by mouth daily.   zolpidem 5 MG tablet Commonly known as: AMBIEN Take 1 tablet (5 mg total) by mouth at bedtime as needed for sleep.               Discharge Care Instructions  (From admission, onward)           Start     Ordered   02/24/23 0000  Change dressing on IV access line weekly and PRN  (Home infusion instructions - [facility] )        02/24/23 0959   02/23/23 0000  Change dressing       Comments: Maintain surgical dressing until follow up in the clinic. If the edges start to pull up, may reinforce with tape. If the dressing is no longer working, may remove and cover with gauze and tape, but must keep the area dry and clean.  Call with any questions or concerns.   02/23/23 0809              Signed: Thea Gist 02/25/2023, 7:48 AM  [facility] is now [facility] [address], [place], [place] [address]  Phone: [phone] Facebook  Instagram  [employer]

## 2023-02-25 NOTE — TOC Progression Note (Signed)
Transition of Care [facility]) - Progression Note    Patient Details  Name: [name] [name] MRN: [id number] Date of Birth: January 04, 1951  Transition of Care [facility]) CM/SW Contact  Adrian Prows, RN Phone Number: 02/25/2023, 11:42 AM  Clinical Narrative:    Sharla Kidney w/ Len Childs, RPh and Jeri Modena at [place] no abx needed before d/c; also notified Pam at [place] and Kandee Keen at [place] pt will d/c today; no TOC needs.  Expected Discharge Plan: Home w Home Health Services Barriers to Discharge: Barriers Resolved  Expected Discharge Plan and Services In-house Referral: Clinical Social Work   Post Acute Care Choice: Home Health Living arrangements for the past 2 months: Single Family Home Expected Discharge Date: 02/25/23               DME Arranged: N/A DME Agency: NA       HH Arranged: RN, IV Antibiotics HH Agency: [facility] Date [facility] Agency Contacted: 02/22/23 Time HH Agency Contacted: 1419 Representative spoke with at [facility] Agency: [facility] - Jeri Modena;  Frances F[facility] - Lorenza Chick   Social Determinants of Health (SDOH) Interventions SDOH Screenings   Food Insecurity: No Food Insecurity (02/21/2023)  Housing: Low Risk  (02/21/2023)  Transportation Needs: No Transportation Needs (02/21/2023)  Utilities: Not At Risk (02/21/2023)  Depression (PHQ2-9): Low Risk  (07/14/2022)  Tobacco Use: Low Risk  (02/23/2023)    Readmission Risk Interventions    02/22/2023    2:16 PM  Readmission Risk Prevention Plan  Post Dischage Appt Complete  Medication Screening Complete  Transportation Screening Complete

## 2023-02-25 NOTE — Plan of Care (Signed)
  Problem: Education: Goal: Knowledge of the prescribed therapeutic regimen will improve Outcome: Progressing   Problem: Pain Management: Goal: Pain level will decrease with appropriate interventions Outcome: Progressing   Problem: Activity: Goal: Ability to avoid complications of mobility impairment will improve Outcome: Progressing   

## 2023-02-25 NOTE — Progress Notes (Signed)
Physical Therapy Treatment Patient Details Name: [name] [name] MRN: [id number] DOB: 09-23-1951 Today's Date: 02/25/2023   History of Present Illness 72 yo male s/p EXCISIONAL R TOTAL KNEE ARTHROPLASTY WITH articulating ANTIBIOTIC SPACER on 02/21/23; PMH: primary R TKA 10/25/22, I&D and  poly exchange R knee    PT Comments    Pt continues motivated and progressing well with mobility.  Pt up to ambulate increased distance with noted improvement in stability, negotiated stairs, reviewed bed mobility, and performed HEP with written instruction provided and reviewed.  Pt eager for dc home this date.   Recommendations for follow up therapy are one component of a multi-disciplinary discharge planning process, led by the attending physician.  Recommendations may be updated based on patient status, additional functional criteria and insurance authorization.  Follow Up Recommendations       Assistance Recommended at Discharge PRN  Patient can return home with the following Assist for transportation;A little help with bathing/dressing/bathroom;Assistance with cooking/housework;Help with stairs or ramp for entrance   Equipment Recommendations  None recommended by PT    Recommendations for Other Services       Precautions / Restrictions Precautions Precautions: Fall;Knee Required Braces or Orthoses: Knee Immobilizer - Right Knee Immobilizer - Right: On when out of bed or walking Restrictions Weight Bearing Restrictions: Yes RLE Weight Bearing: Partial weight bearing RLE Partial Weight Bearing Percentage or Pounds: 50     Mobility  Bed Mobility Overal bed mobility: Needs Assistance Bed Mobility: Supine to Sit     Supine to sit: Min assist     General bed mobility comments: min assist for L LE management only    Transfers Overall transfer level: Needs assistance Equipment used: Rolling walker (2 wheels) Transfers: Sit to/from Stand Sit to Stand: Min guard, Supervision            General transfer comment: cues for RLE position, hand placement    Ambulation/Gait Ambulation/Gait assistance: Min guard, Supervision Gait Distance (Feet): 85 Feet Assistive device: Rolling walker (2 wheels) Gait Pattern/deviations: Step-to pattern Gait velocity: decr     General Gait Details: min cues for posture and position from RW   Stairs Stairs: Yes Stairs assistance: Min assist Stair Management: Two rails, Step to pattern, Forwards Number of Stairs: 3 General stair comments: min cues for sequence   Wheelchair Mobility    Modified Rankin (Stroke Patients Only)       Balance Overall balance assessment: Needs assistance Sitting-balance support: No upper extremity supported, Feet supported Sitting balance-Leahy Scale: Good     Standing balance support: No upper extremity supported Standing balance-Leahy Scale: Fair                              Cognition Arousal/Alertness: Awake/alert Behavior During Therapy: WFL for tasks assessed/performed Overall Cognitive Status: Within Functional Limits for tasks assessed                                          Exercises Total Joint Exercises Ankle Circles/Pumps: AROM, Both, 15 reps, Supine Quad Sets: AROM, Both, 10 reps, Supine Heel Slides: AAROM, 10 reps, Supine, Right Hip ABduction/ADduction: AAROM, Right, 10 reps, Supine Straight Leg Raises: AAROM, Right, 10 reps, Supine    General Comments        Pertinent Vitals/Pain Pain Assessment Pain Assessment: 0-10 Pain Score: 6  Pain  Location: right knee with activity Pain Descriptors / Indicators: Aching, Burning, Pressure, Sore Pain Intervention(s): Limited activity within patient's tolerance, Monitored during session, Premedicated before session, Ice applied    Home Living                          Prior Function            PT Goals (current goals can now be found in the care plan section) Acute Rehab PT  Goals Patient Stated Goal: get TKA back after abx PT Goal Formulation: With patient Time For Goal Achievement: 03/01/23 Potential to Achieve Goals: Good Progress towards PT goals: Progressing toward goals    Frequency    7X/week      PT Plan Current plan remains appropriate    Co-evaluation              AM-PAC PT "6 Clicks" Mobility   Outcome Measure  Help needed turning from your back to your side while in a flat bed without using bedrails?: A Little Help needed moving from lying on your back to sitting on the side of a flat bed without using bedrails?: A Little Help needed moving to and from a bed to a chair (including a wheelchair)?: A Little Help needed standing up from a chair using your arms (e.g., wheelchair or bedside chair)?: A Little Help needed to walk in hospital room?: A Little Help needed climbing 3-5 steps with a railing? : A Little 6 Click Score: 18    End of Session Equipment Utilized During Treatment: Right knee immobilizer Activity Tolerance: Patient tolerated treatment well Patient left: in chair;with call bell/phone within reach;with family/visitor present Nurse Communication: Mobility status PT Visit Diagnosis: Other abnormalities of gait and mobility (R26.89);Difficulty in walking, not elsewhere classified (R26.2)     Time: 4098-1191 PT Time Calculation (min) (ACUTE ONLY): 47 min  Charges:  $Gait Training: 8-22 mins $Therapeutic Exercise: 8-22 mins $Therapeutic Activity: 8-22 mins                     Mauro Kaufmann PT Acute Rehabilitation Services Pager [phone] Office [phone]    Chrisandra Wiemers 02/25/2023, 1:41 PM

## 2023-02-25 NOTE — Progress Notes (Signed)
Patient verbalized understanding of PICC discharge instructions and infection prevention, no further questions from patient or spouse. IV team capped off PICC and prepped patient for discharge.

## 2023-02-25 NOTE — Progress Notes (Signed)
   Subjective: 4 Days Post-Op Procedure(s) (LRB): EXCISIONAL TOTAL KNEE ARTHROPLASTY WITH ANTIBIOTIC SPACERS (Right)  Pt c/o mild pain and soreness but overall doing well Ready for d/c today after therapy Plan for 6 weeks of keflex PICC line in place Denies any new symptoms or issues Patient reports pain as mild.  Objective:   VITALS:   Vitals:   02/24/23 2105 02/25/23 0512  BP: 112/61 (!) 118/59  Pulse: 65 64  Resp: 17 17  Temp: 98.3 F (36.8 C) 98.2 F (36.8 C)  SpO2: 96% 97%    Right knee dressing and immobilizer intact Nv intact distally No rashes but mild edema distally No erythema No signs of dvt   LABS Recent Labs    02/23/23 0307  HGB 9.4*  HCT 29.4*  WBC 7.7  PLT 187    No results for input(s): "NA", "K", "BUN", "CREATININE", "GLUCOSE" in the last 72 hours.   Assessment/Plan: 4 Days Post-Op Procedure(s) (LRB): EXCISIONAL TOTAL KNEE ARTHROPLASTY WITH ANTIBIOTIC SPACERS (Right) Plan to d/c today after therapy F/u in the office in 2 weeks 6 weeks of cefazolin per ID    Alphonsa Overall PA-C, [facility] is now [facility] [address], [place], [place] [address] Phone: [phone] [email or web address] Facebook  [employer]

## 2023-02-26 ENCOUNTER — Telehealth: Payer: Self-pay | Admitting: Infectious Disease

## 2023-02-26 DIAGNOSIS — T8453XA Infection and inflammatory reaction due to internal right knee prosthesis, initial encounter: Secondary | ICD-10-CM | POA: Diagnosis not present

## 2023-02-26 DIAGNOSIS — K219 Gastro-esophageal reflux disease without esophagitis: Secondary | ICD-10-CM | POA: Diagnosis not present

## 2023-02-26 DIAGNOSIS — I10 Essential (primary) hypertension: Secondary | ICD-10-CM | POA: Diagnosis not present

## 2023-02-26 MED ORDER — METRONIDAZOLE 500 MG PO TABS
500.0000 mg | ORAL_TABLET | Freq: Three times a day (TID) | ORAL | 0 refills | Status: DC
Start: 2023-02-26 — End: 2023-05-15

## 2023-02-26 NOTE — Telephone Encounter (Signed)
Patient grew [employer] from most recent operative culture.  I would like him to start on flagyl 500 mg po TID and continue this to try to push through 6 weeks.  Unfortunately the lab does not do sensi testing for this organism

## 2023-02-27 LAB — AEROBIC/ANAEROBIC CULTURE W GRAM STAIN (SURGICAL/DEEP WOUND)

## 2023-02-28 ENCOUNTER — Encounter: Payer: Self-pay | Admitting: *Deleted

## 2023-02-28 ENCOUNTER — Telehealth: Payer: Self-pay | Admitting: *Deleted

## 2023-02-28 ENCOUNTER — Telehealth: Payer: Self-pay | Admitting: Internal Medicine

## 2023-02-28 DIAGNOSIS — K219 Gastro-esophageal reflux disease without esophagitis: Secondary | ICD-10-CM | POA: Diagnosis not present

## 2023-02-28 DIAGNOSIS — I10 Essential (primary) hypertension: Secondary | ICD-10-CM | POA: Diagnosis not present

## 2023-02-28 DIAGNOSIS — T8453XA Infection and inflammatory reaction due to internal right knee prosthesis, initial encounter: Secondary | ICD-10-CM | POA: Diagnosis not present

## 2023-02-28 NOTE — Telephone Encounter (Signed)
[name] [name] from [place]  Phone:  [phone]  Patient was seen by [facility] over the weekend.  Patient is no longer taking the fish oil or ambien.  Just FYI

## 2023-02-28 NOTE — Transitions of Care (Post Inpatient/ED Visit) (Signed)
   02/28/2023  Name: [name] [name] MRN: [id number] DOB: 05-22-1951  Today's TOC FU Call Status: Today's TOC FU Call Status:: Unsuccessul Call (1st Attempt) Unsuccessful Call (1st Attempt) Date: 02/28/23  Attempted to reach the patient regarding the most recent Inpatient visit; left HIPAA compliant voice message requesting call back  Follow Up Plan: Additional outreach attempts will be made to reach the patient to complete the Transitions of Care (Post Inpatient visit) call.   Caryl Pina, RN, BSN, CCRN Alumnus RN CM Care Coordination/ Transition of Care- [facility] Care Management [phone]: direct office

## 2023-03-01 ENCOUNTER — Encounter: Payer: Self-pay | Admitting: *Deleted

## 2023-03-01 ENCOUNTER — Telehealth: Payer: Self-pay | Admitting: *Deleted

## 2023-03-01 LAB — LAB REPORT - SCANNED: EGFR: 97

## 2023-03-01 NOTE — Telephone Encounter (Signed)
Patient aware and says he is taking Flagyl 500 mg po TID and he spoke to Dr.Van Dam on 5/27.

## 2023-03-01 NOTE — Transitions of Care (Post Inpatient/ED Visit) (Signed)
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 Nilsa Nutting PA about the probiotic you have suggested") Any questions or concerns?: No  Items Reviewed: Did you receive and understand the discharge instructions provided?: Yes (thoroughly reviewed with patient who verbalizes good understanding of same) Medications obtained,verified, and reconciled?: Yes (Medications Reviewed) (Full medication reconciliation/ review completed; no concerns or discrepancies identified; confirmed patient obtained/ is taking all newly Rx'd medications as instructed; self-manages medications and denies questions/ concerns around medications today) Any new allergies since your discharge?: No Dietary orders reviewed?: Yes Type of Diet Ordered:: "Very healthy" Do you have support at home?: Yes People in Home: spouse Name of Support/Comfort Primary Source: Reports independent in self-care activities; supportive spouse assists as/ if needed/ indicated  Medications Reviewed Today: Medications Reviewed Today     Reviewed by Michaela Corner, RN (Registered Nurse) on 03/01/23 at 1048  Med List Status: <None>   Medication Order Taking? Sig Documenting Provider Last Dose Status Informant  ALPRAZolam (XANAX) 1 MG tablet [id number] Yes Take 1 mg by mouth at bedtime. [provider] Taking Active Self  APPLE CIDER  VINEGAR PO [id number] No Take 1 capsule by mouth daily.  Patient not taking: Reported on 03/01/2023   [provider] Not Taking Active Self           Med Note Halford Decamp   Fri Feb 17, 2023 11:44 AM) On hold due to upcoming procedure   aspirin 81 MG chewable tablet [id number] Yes Chew 1 tablet (81 mg total) by mouth 2 (two) times daily for 28 days. Cassandria Anger, PA-C Taking Active   calcium carbonate (TUMS - DOSED IN MG ELEMENTAL CALCIUM) 500 MG chewable tablet [id number] Yes Chew 2 tablets by mouth 2 (two) times daily as needed for indigestion or heartburn. [provider] Taking Active Self           Med Note Otelia Limes Mar 01, 2023 10:46 AM) 03/01/23: reports during [facility] call has not needed recently  ceFAZolin (ANCEF) IVPB [id number] Yes Inject 2 g into the vein every 8 (eight) hours. Indication:  R-knee PJI First Dose: No - but has received previously Last Day of Therapy:  04/04/23 Labs - Once weekly:  CBC/D and BMP, ESR and CRP Method of administration: IV Push Pull PICC line at the completion of IV antibiotics Method of administration may be changed at the discretion of home infusion pharmacist based upon assessment of the patient and/or caregiver's ability to self-administer the medication ordered. Cassandria Anger, PA-C Taking Active   celecoxib (CELEBREX) 200 MG capsule [id number] Yes Take 1 capsule (200 mg total) by mouth 2 (two) times daily. Cassandria Anger, PA-C Taking Active   Ergocalciferol (VITAMIN D2) 50 MCG (2000 UT) TABS [id number] No Take 2,000 Units by mouth daily.  Patient not taking: Reported on 03/01/2023   [provider] Not Taking Active Self  Med Note Halford Decamp   Fri Feb 17, 2023 11:44 AM) On hold due to upcoming procedure   Ketotifen Fumarate (ALLERGY EYE DROPS OP) [id number] Yes Place 1 drop into both eyes daily as needed (red/itchy eyes). [provider] Taking Active Self  Magnesium 500 MG  TABS [id number] Yes Take 500 mg by mouth daily. [provider] Taking Active Self           Med Note Halford Decamp   Fri Feb 17, 2023 11:44 AM) On hold due to upcoming procedure   metroNIDAZOLE (FLAGYL) 500 MG tablet [id number] Yes Take 500 mg by mouth 3 (three) times daily. [provider] Taking Active   metroNIDAZOLE (FLAGYL) 500 MG tablet [id number] Yes Take 1 tablet (500 mg total) by mouth 3 (three) times daily. Daiva Eves, Lisette Grinder, MD Taking Active   Multiple Vitamin (MULTI-VITAMIN PO) [id number] No Take 1 tablet by mouth daily.  Patient not taking: Reported on 03/01/2023   [provider] Not Taking Active Self           Med Note Halford Decamp   Fri Feb 17, 2023 11:44 AM) On hold due to upcoming procedure   Omega-3 Fatty Acids (FISH OIL) 1200 MG CAPS [id number] No Take 1,200 mg by mouth daily.  Patient not taking: Reported on 03/01/2023   [provider] Not Taking Active Self           Med Note Halford Decamp   Fri Feb 17, 2023 11:48 AM) On hold due to upcoming procedure   oxyCODONE 10 MG TABS [id number] Yes Take 1-1.5 tablets (10-15 mg total) by mouth every 4 (four) hours as needed for severe pain. Cassandria Anger, PA-C Taking Active   polyethylene glycol (MIRALAX / GLYCOLAX) 17 g packet [id number] Yes Take 17 g by mouth 2 (two) times daily. Cassandria Anger, PA-C Taking Active   sertraline (ZOLOFT) 100 MG tablet [id number] Yes TAKE 1 TABLET BY MOUTH EVERY DAY Myrlene Broker, MD Taking Active Self  sodium chloride (OCEAN) 0.65 % SOLN nasal spray [id number] Yes Place 1 spray into both nostrils as needed for congestion. [provider] Taking Active Self  tamsulosin (FLOMAX) 0.4 MG CAPS capsule [id number] Yes Take 0.4 mg by mouth every evening. [provider] Taking Active Self           Med Note Delton See, LISA Elbert Ewings   Sat Dec 20, 2014  5:27 PM)    telmisartan (MICARDIS) 80 MG tablet [id number] Yes TAKE 1 TABLET BY MOUTH  EVERY DAY Myrlene Broker, MD Taking Active Self  tiZANidine (ZANAFLEX) 2 MG tablet [id number] Yes Take 1-2 tablets (2-4 mg total) by mouth every 8 (eight) hours as needed for muscle spasms (muscle pain). Cassandria Anger, PA-C Taking Active   Vitamin E 670 MG (1000 UT) CAPS [id number] No Take 670 mg by mouth daily.  Patient not taking: Reported on 03/01/2023   [provider] Not Taking Active Self           Med Note Halford Decamp   Fri Feb 17, 2023 11:49 AM) On hold due to upcoming procedure   Zinc 30 MG TABS [id number] No Take 30 mg by mouth daily.  Patient not taking: Reported on 03/01/2023   [provider] Not Taking Active Self           Med Note Halford Decamp   Fri Feb 17, 2023 11:43 AM) On hold due to upcoming  procedure  zolpidem (AMBIEN) 5 MG tablet [id number]  Take 1 tablet (5 mg total) by mouth at bedtime as needed for sleep.  Patient not taking: Reported on 02/17/2023   Cherie Dark, [place]  Expired 01/10/23 2359             Home Care and Equipment/Supplies: Were Home Health Services Ordered?: Yes Name of Home Health Agency:: [name] for IV infusion of antibiotics/ [name] for nursing: both arrived Has Agency set up a time to come to your home?: Yes First Home Health Visit Date: 02/26/23 Any new equipment or medical supplies ordered?: No  Functional Questionnaire: Do you need assistance with bathing/showering or dressing?: Yes (wife assists as indicated/ needed) Do you need assistance with meal preparation?: Yes (wife assists as indicated/ needed) Do you need assistance with eating?: No Do you have difficulty maintaining continence: No Do you need assistance with getting out of bed/getting out of a chair/moving?: Yes (wife assists as indicated/ needed) Do you have difficulty managing or taking your medications?: Yes (wife assists as indicated/ needed)  Follow up appointments reviewed: PCP Follow-up appointment confirmed?: NA (verified not  indicated per hospital discharging provider discharge notes) Specialist Hospital Follow-up appointment confirmed?: Yes Date of Specialist follow-up appointment?: 03/08/23 (verified this is recommended time frame for follow up per hospital discharging provider notes) Follow-Up Specialty Provider:: orthopedic and ID specialists Do you need transportation to your follow-up appointment?: No Do you understand care options if your condition(s) worsen?: Yes-patient verbalized understanding  SDOH Interventions Today    Flowsheet Row Most Recent Value  SDOH Interventions   Food Insecurity Interventions Intervention Not Indicated  Transportation Interventions Intervention Not Indicated  [wife provides transportation post recent hospitalization/ surgery]      TOC Interventions Today    Flowsheet Row Most Recent Value  TOC Interventions   TOC Interventions Discussed/Reviewed TOC Interventions Discussed  [provided my direct contact information should questions/ concerns/ needs arise post-TOC call, prior to RN CM telephone visit  03/14/23]      Interventions Today    Flowsheet Row Most Recent Value  Chronic Disease   Chronic disease during today's visit Other  [(R) knee infection with antibiotic spacers]  General Interventions   General Interventions Discussed/Reviewed General Interventions Discussed, Doctor Visits, Referral to Nurse, Durable Medical Equipment (DME)  Doctor Visits Discussed/Reviewed Doctor Visits Discussed, Specialist  Durable Medical Equipment (DME) Dan Humphreys, Other  [currently wearing knee brace "all the time" with restriction in weight bearing- "light" weight bearing: using walker "all the time"]  PCP/Specialist Visits Compliance with follow-up visit  Education Interventions   Education Provided Provided Education  Provided Verbal Education On Medication, Other  [potential benefit of probiotic therapy in setting of prolonged antibiotic therapy,  confirmed patient's wife has no  questions about homme IV antibiotic administration]  Nutrition Interventions   Nutrition Discussed/Reviewed Nutrition Discussed  Pharmacy Interventions   Pharmacy Dicussed/Reviewed Pharmacy Topics Discussed  [Full medication review with updating medication list in EHR per patient report]  Safety Interventions   Safety Discussed/Reviewed Safety Discussed, Fall Risk      Caryl Pina, RN, BSN, CCRN Alumnus RN CM Care Coordination/ Transition of Care- [facility] Care Management [phone]: direct office

## 2023-03-05 DIAGNOSIS — T8453XA Infection and inflammatory reaction due to internal right knee prosthesis, initial encounter: Secondary | ICD-10-CM | POA: Diagnosis not present

## 2023-03-07 DIAGNOSIS — I10 Essential (primary) hypertension: Secondary | ICD-10-CM | POA: Diagnosis not present

## 2023-03-07 DIAGNOSIS — T8453XA Infection and inflammatory reaction due to internal right knee prosthesis, initial encounter: Secondary | ICD-10-CM | POA: Diagnosis not present

## 2023-03-07 DIAGNOSIS — K219 Gastro-esophageal reflux disease without esophagitis: Secondary | ICD-10-CM | POA: Diagnosis not present

## 2023-03-08 ENCOUNTER — Telehealth: Payer: Self-pay

## 2023-03-08 ENCOUNTER — Encounter: Payer: Self-pay | Admitting: Internal Medicine

## 2023-03-08 ENCOUNTER — Other Ambulatory Visit: Payer: Self-pay

## 2023-03-08 ENCOUNTER — Ambulatory Visit: Payer: Medicare Other | Admitting: Internal Medicine

## 2023-03-08 VITALS — BP 121/69 | HR 62 | Temp 98.3°F | Wt 231.0 lb

## 2023-03-08 DIAGNOSIS — M009 Pyogenic arthritis, unspecified: Secondary | ICD-10-CM | POA: Diagnosis not present

## 2023-03-08 DIAGNOSIS — Z471 Aftercare following joint replacement surgery: Secondary | ICD-10-CM | POA: Diagnosis not present

## 2023-03-08 DIAGNOSIS — Z96651 Presence of right artificial knee joint: Secondary | ICD-10-CM | POA: Diagnosis not present

## 2023-03-08 LAB — LAB REPORT - SCANNED: EGFR: 94

## 2023-03-08 NOTE — Progress Notes (Signed)
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   Sepsis due to methicillin resistant Staphylococcus aureus (MRSA) (HCC) 11/18/2022   Penicillin allergy 11/18/2022   Infection of right knee (HCC) 11/14/2022   Chronic sinusitis 09/02/2022   Arthritis of right knee 09/02/2022   Lower respiratory infection 08/08/2022   Acute cough 02/01/2022   Encounter for general adult medical examination with abnormal findings 07/09/2021   BPH (benign prostatic hyperplasia) 07/09/2021   MDD (major depressive disorder), recurrent, in full remission (HCC) 07/09/2021   Essential hypertension 10/20/2008    Patient's Medications  New Prescriptions   No medications on file  Previous Medications   ALPRAZOLAM (XANAX) 1 MG TABLET    Take 1 mg by mouth at bedtime.   APPLE CIDER VINEGAR PO    Take 1 capsule by mouth daily.   ASPIRIN 81 MG CHEWABLE TABLET    Chew 1 tablet (81 mg total) by mouth 2 (two) times daily for 28 days.   CALCIUM CARBONATE (TUMS - DOSED IN MG ELEMENTAL CALCIUM) 500 MG CHEWABLE TABLET    Chew 2 tablets by mouth 2 (two) times daily as needed for indigestion or heartburn.   CEFAZOLIN (ANCEF) IVPB    Inject 2 g into the vein every 8 (eight) hours. Indication:  R-knee PJI First Dose: No - but has received previously Last Day of Therapy:  04/04/23 Labs - Once weekly:  CBC/D and BMP, ESR and CRP Method of administration: IV Push Pull PICC line at the completion of IV antibiotics Method of administration may be changed at the discretion of home infusion pharmacist based upon assessment of the patient and/or caregiver's ability to self-administer the medication ordered.   CELECOXIB (CELEBREX) 200 MG CAPSULE    Take 1 capsule (200 mg total) by mouth 2 (two) times daily.   ERGOCALCIFEROL (VITAMIN D2) 50 MCG (2000 UT) TABS    Take 2,000 Units by mouth daily.   KETOTIFEN FUMARATE (ALLERGY EYE DROPS OP)    Place 1 drop into  both eyes daily as needed (red/itchy eyes).   MAGNESIUM 500 MG TABS    Take 500 mg by mouth daily.   METRONIDAZOLE (FLAGYL) 500 MG TABLET    Take 500 mg by mouth 3 (three) times daily.   METRONIDAZOLE (FLAGYL) 500 MG TABLET    Take 1 tablet (500 mg total) by mouth 3 (three) times daily.   MULTIPLE VITAMIN (MULTI-VITAMIN PO)    Take 1 tablet by mouth daily.   OMEGA-3 FATTY ACIDS (FISH OIL) 1200 MG CAPS    Take 1,200 mg by mouth daily.   OXYCODONE 10 MG TABS    Take 1-1.5 tablets (10-15 mg total) by mouth every 4 (four) hours as needed for severe pain.   POLYETHYLENE GLYCOL (MIRALAX / GLYCOLAX) 17 G PACKET    Take 17 g by mouth 2 (two) times daily.   SERTRALINE (ZOLOFT) 100 MG TABLET    TAKE 1 TABLET BY MOUTH EVERY DAY   SODIUM CHLORIDE (OCEAN) 0.65 % SOLN NASAL SPRAY    Place 1 spray into both nostrils as needed for congestion.   TAMSULOSIN (FLOMAX) 0.4 MG CAPS CAPSULE    Take 0.4 mg by mouth every evening.   TELMISARTAN (MICARDIS) 80 MG TABLET    TAKE 1 TABLET BY MOUTH EVERY DAY   TIZANIDINE (ZANAFLEX) 2 MG TABLET    Take 1-2 tablets (2-4 mg total) by  mouth every 8 (eight) hours as needed for muscle spasms (muscle pain).   VITAMIN E 670 MG (1000 UT) CAPS    Take 670 mg by mouth daily.   ZINC 30 MG TABS    Take 30 mg by mouth daily.   ZOLPIDEM (AMBIEN) 5 MG TABLET    Take 1 tablet (5 mg total) by mouth at bedtime as needed for sleep.  Modified Medications   No medications on file  Discontinued Medications   No medications on file    Subjective: 72 year old male with past medical history of below including right knee PJI with MSSA status post I&D on 11/15/2012 treated with IV cefazolin admitted with right knee PJI.  Underwent resection of right knee arthroplasty and spacer placement.  He was initially started on Dapto and then transition to cefazolin.  Added metronidazole as cultures grew Finegoldia magna.  Of note patient states he has been adherent to suppressive cefadroxil without missed dose.   Regardless given MSSA history we covered with cefazolin. Today 03/08/2023: Doing well.  Tolerating antibiotics.  Noticed some drainage at PICC line site, no fevers or chills. Review of Systems: Review of Systems  All other systems reviewed and are negative.   Past Medical History:  Diagnosis Date   Arthritis    Bronchitis    Depression    GERD (gastroesophageal reflux disease)    Hypertension    Skin cancer     Social History   Tobacco Use   Smoking status: Never   Smokeless tobacco: Never  Vaping Use   Vaping Use: Never used  Substance Use Topics   Alcohol use: Yes    Comment: socially   Drug use: No    Family History  Problem Relation Age of Onset   Emphysema Mother        heart failure indusce emphysema   Heart disease Father    Heart attack Father    Heart disease Sister 36       [id number]    Allergies  Allergen Reactions   Avapro [Irbesartan] Cough   Lexapro [Escitalopram Oxalate] Other (See Comments)    Anxiety, insomnia    Health Maintenance  Topic Date Due   Colonoscopy  Never done   COVID-19 Vaccine (6 - 2023-24 season) 06/03/2022   Zoster Vaccines- Shingrix (1 of 2) 04/08/2023 (Originally 04/22/2002)   INFLUENZA VACCINE  05/04/2023   Medicare Annual Wellness (AWV)  07/15/2023   DTaP/Tdap/Td (4 - Td or Tdap) 04/02/2029   Pneumonia Vaccine 39+ Years old  Completed   Hepatitis C Screening  Completed   HPV VACCINES  Aged Out    Objective:  Vitals:   03/08/23 1405  BP: 121/69  Pulse: 62  Temp: 98.3 F (36.8 C)  TempSrc: Oral  SpO2: 97%  Weight: 231 lb (104.8 kg)   Body mass index is 34.11 kg/m.  Physical Exam Constitutional:      General: He is not in acute distress.    Appearance: He is normal weight. He is not toxic-appearing.  HENT:     Head: Normocephalic and atraumatic.     Right Ear: External ear normal.     Left Ear: External ear normal.     Nose: No congestion or rhinorrhea.     Mouth/Throat:     Mouth: Mucous membranes are  moist.     Pharynx: Oropharynx is clear.  Eyes:     Extraocular Movements: Extraocular movements intact.     Conjunctiva/sclera: Conjunctivae normal.     Pupils:  Pupils are equal, round, and reactive to light.  Cardiovascular:     Rate and Rhythm: Normal rate and regular rhythm.     Heart sounds: No murmur heard.    No friction rub. No gallop.  Pulmonary:     Effort: Pulmonary effort is normal.     Breath sounds: Normal breath sounds.  Abdominal:     General: Abdomen is flat. Bowel sounds are normal.     Palpations: Abdomen is soft.  Musculoskeletal:        General: No swelling.     Cervical back: Normal range of motion and neck supple.  Skin:    General: Skin is warm and dry.  Neurological:     General: No focal deficit present.     Mental Status: He is oriented to person, place, and time.  Psychiatric:        Mood and Affect: Mood normal.     Lab Results Lab Results  Component Value Date   WBC 7.7 02/23/2023   HGB 9.4 (L) 02/23/2023   HCT 29.4 (L) 02/23/2023   MCV 87.2 02/23/2023   PLT 187 02/23/2023    Lab Results  Component Value Date   CREATININE 0.79 02/22/2023   BUN 13 02/22/2023   NA 136 02/22/2023   K 4.0 02/22/2023   CL 104 02/22/2023   CO2 26 02/22/2023    Lab Results  Component Value Date   ALT 22 07/07/2022   AST 17 07/07/2022   ALKPHOS 50 07/07/2022   BILITOT 0.8 07/07/2022    Lab Results  Component Value Date   CHOL 195 07/07/2022   HDL 76.80 07/07/2022   LDLCALC 109 (H) 07/07/2022   TRIG 45.0 07/07/2022   CHOLHDL 3 07/07/2022   No results found for: "LABRPR", "RPRTITER" No results found for: "HIV1RNAQUANT", "HIV1RNAVL", "CD4TABS"   Problem List Items Addressed This Visit   None  Assessment/Plan #Right knee PJI status post resection of right knee arthroplasty with placement of spacer with Cx+ fingoldia magna #History of right knee PJI with MSSA status post I&D on 11/15/2022 -Continue cefazolin  + metronidazole x 6 weeks EOT on  7/2 -F/U with ID on 7/2   #medicaiton monitoring -wc 7.4, sct 0.84, esr 6, crp3 in 03/07/23 -Dr. Charlann Boxer today, dressing off and aspirated knee"pssible heamtoma"   #PICC line issues -Line has been drianaing for the last week for or soper pt and pt noted by home health. -His line is also pruritic, I see some paules under dreassing. We do not have the staff to take PICC lin dressing off and redressa s such sen tto ED for evaluation  Danelle Earthly, MD [facility] 03/08/2023, 2:12 PM   I have personally spent 45 minutes involved in face-to-face and non-face-to-face activities for this patient on the day of the visit. Professional time spent includes the following activities: Preparing to see the patient (review of tests), Obtaining and/or reviewing separately obtained history (admission/discharge record), Performing a medically appropriate examination and/or evaluation , Ordering medications/tests/procedures, referring and communicating with other health care professionals, Documenting clinical information in the EMR, Independently interpreting results (not separately reported), Communicating results to the patient/family/caregiver, Counseling and educating the patient/family/caregiver and Care coordination (not separately reported).

## 2023-03-08 NOTE — Telephone Encounter (Addendum)
Error Harriet Sutphen M Debie Ashline, RMA  

## 2023-03-09 ENCOUNTER — Encounter (HOSPITAL_COMMUNITY): Payer: Self-pay

## 2023-03-09 ENCOUNTER — Emergency Department (HOSPITAL_COMMUNITY)
Admission: EM | Admit: 2023-03-09 | Discharge: 2023-03-09 | Disposition: A | Discharge status: Home / Self Care | Payer: Medicare Other | Attending: Emergency Medicine | Admitting: Emergency Medicine

## 2023-03-09 ENCOUNTER — Other Ambulatory Visit: Payer: Self-pay

## 2023-03-09 DIAGNOSIS — Z79899 Other long term (current) drug therapy: Secondary | ICD-10-CM | POA: Insufficient documentation

## 2023-03-09 DIAGNOSIS — L244 Irritant contact dermatitis due to drugs in contact with skin: Secondary | ICD-10-CM | POA: Diagnosis not present

## 2023-03-09 DIAGNOSIS — I10 Essential (primary) hypertension: Secondary | ICD-10-CM | POA: Insufficient documentation

## 2023-03-09 DIAGNOSIS — Z7982 Long term (current) use of aspirin: Secondary | ICD-10-CM | POA: Insufficient documentation

## 2023-03-09 DIAGNOSIS — R21 Rash and other nonspecific skin eruption: Secondary | ICD-10-CM | POA: Diagnosis present

## 2023-03-09 DIAGNOSIS — L249 Irritant contact dermatitis, unspecified cause: Secondary | ICD-10-CM | POA: Diagnosis not present

## 2023-03-09 NOTE — Discharge Instructions (Signed)
Avoid using the dressing that has the chlorhexidine gel.  Continue your current abx regimen.   Return for fevers, chills, worsening symptoms

## 2023-03-09 NOTE — Progress Notes (Signed)
VAST consult received to assess PICC site and change dressing. Suspected chlorhexidine allergy by ortho/ID services per patient and ER RN. PICC dressing removed and site cleaned with betadine and allowed to dry; site then cleaned with alcohol swabs and allowed to dry. Site pink with pinpoint bumpy rash to skin around insertion site where chlorhexidine patch came into contact with skin.  Applied skin protectant prior to placing new securing device and dressing. No Biopatch placed due to suspected allergy to chlorhexidine. Line with good blood return and flushed easily.

## 2023-03-09 NOTE — ED Provider Notes (Signed)
[place] EMERGENCY DEPARTMENT AT [facility] Provider Note   CSN: [id number] Arrival date & time: 03/09/23  1012     History  Chief Complaint  Patient presents with   Allergic Reaction    [name] [name] is a 72 y.o. male.   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  Patient currently has a PICC line and is receiving IV antibiotics.  Patient started noticing several days ago some itching and redness below the dressing of his PICC line.  He is not having any fevers or chills.  He denies any weakness.  Patient went to see his infectious disease doctor yesterday in the office.  They recommended changing the dressing but they did not have the dressings at the infectious disease center so they recommended he come to the ED to have his PICC line assessed. Home Medications Prior to Admission medications   Medication Sig Start Date End Date Taking? Authorizing Provider  ALPRAZolam Prudy Feeler) 1 MG tablet Take 1 mg by mouth at bedtime. 01/30/23   [provider]  APPLE CIDER VINEGAR PO Take 1 capsule by mouth daily.    [provider]  aspirin 81 MG chewable tablet Chew 1 tablet (81 mg total) by mouth 2 (two) times daily for 28 days. 02/23/23 03/23/23  Cassandria Anger, PA-C  calcium carbonate (TUMS - DOSED IN MG ELEMENTAL CALCIUM) 500 MG chewable tablet Chew 2 tablets by mouth 2 (two) times daily as needed for indigestion or heartburn.    [provider]  ceFAZolin (ANCEF) IVPB Inject 2 g into the vein every 8 (eight) hours. Indication:  R-knee PJI First Dose: No - but has received previously Last Day of Therapy:  04/04/23 Labs - Once weekly:  CBC/D and BMP, ESR and CRP Method of administration: IV Push Pull PICC line at the completion of IV antibiotics Method of administration may be changed at the discretion of home infusion pharmacist based upon  assessment of the patient and/or caregiver's ability to self-administer the medication ordered. 02/24/23 04/04/23  Cassandria Anger, PA-C  celecoxib (CELEBREX) 200 MG capsule Take 1 capsule (200 mg total) by mouth 2 (two) times daily. 02/24/23 03/26/23  Cassandria Anger, PA-C  Ergocalciferol (VITAMIN D2) 50 MCG (2000 UT) TABS Take 2,000 Units by mouth daily.    [provider]  Ketotifen Fumarate (ALLERGY EYE DROPS OP) Place 1 drop into both eyes daily as needed (red/itchy eyes).    [provider]  Magnesium 500 MG TABS Take 500 mg by mouth daily.    [provider]  metroNIDAZOLE (FLAGYL) 500 MG tablet Take 500 mg by mouth 3 (three) times daily.    [provider]  metroNIDAZOLE (FLAGYL) 500 MG tablet Take 1 tablet (500 mg total) by mouth 3 (three) times daily. 02/26/23   Randall Hiss, MD  Multiple Vitamin (MULTI-VITAMIN PO) Take 1 tablet by mouth daily.    [provider]  Omega-3 Fatty Acids (FISH OIL) 1200 MG CAPS Take 1,200 mg by mouth daily. Patient not taking: Reported on 03/01/2023    [provider]  oxyCODONE 10 MG TABS Take 1-1.5 tablets (10-15 mg total) by mouth every 4 (four) hours as needed for severe pain. 02/23/23   Cassandria Anger, PA-C  polyethylene glycol (MIRALAX / GLYCOLAX) 17 g packet Take 17 g by mouth 2 (two) times daily. 02/23/23   Rosalene Billings  R, PA-C  sertraline (ZOLOFT) 100 MG tablet TAKE 1 TABLET BY MOUTH EVERY DAY 07/19/22   Myrlene Broker, MD  sodium chloride (OCEAN) 0.65 % SOLN nasal spray Place 1 spray into both nostrils as needed for congestion.    [provider]  tamsulosin (FLOMAX) 0.4 MG CAPS capsule Take 0.4 mg by mouth every evening. 12/04/14   [provider]  telmisartan (MICARDIS) 80 MG tablet TAKE 1 TABLET BY MOUTH EVERY DAY 08/29/22   Myrlene Broker, MD  tiZANidine (ZANAFLEX) 2 MG tablet Take 1-2 tablets (2-4 mg total) by mouth every 8 (eight) hours as needed for  muscle spasms (muscle pain). 02/23/23   Cassandria Anger, PA-C  Vitamin E 670 MG (1000 UT) CAPS Take 670 mg by mouth daily.    [provider]  Zinc 30 MG TABS Take 30 mg by mouth daily.    [provider]  zolpidem (AMBIEN) 5 MG tablet Take 1 tablet (5 mg total) by mouth at bedtime as needed for sleep. Patient not taking: Reported on 02/17/2023 11/18/22 01/10/23  Cherie Dark, PA      Allergies    Avapro [irbesartan], Lexapro [escitalopram oxalate], and Chlorhexidine    Review of Systems   Review of Systems  Physical Exam Updated Vital Signs BP 130/76   Pulse 65   Temp 97.9 F (36.6 C)   Resp 15   Ht 1.753 m (5\' 9" )   Wt 107.7 kg   SpO2 96%   BMI 35.06 kg/m  Physical Exam Vitals and nursing note reviewed.  Constitutional:      General: He is not in acute distress.    Appearance: He is well-developed.  HENT:     Head: Normocephalic and atraumatic.     Right Ear: External ear normal.     Left Ear: External ear normal.  Eyes:     General: No scleral icterus.       Right eye: No discharge.        Left eye: No discharge.     Conjunctiva/sclera: Conjunctivae normal.  Neck:     Trachea: No tracheal deviation.  Cardiovascular:     Rate and Rhythm: Normal rate.  Pulmonary:     Effort: Pulmonary effort is normal. No respiratory distress.     Breath sounds: No stridor.  Abdominal:     General: There is no distension.  Musculoskeletal:        General: No swelling or deformity.     Cervical back: Neck supple.  Skin:    General: Skin is warm and dry.     Findings: No rash.     Comments: Mild erythema noted below the PICC line dressing at the site of the gel  Neurological:     Mental Status: He is alert. Mental status is at baseline.     Cranial Nerves: No dysarthria or facial asymmetry.     Motor: No seizure activity.     ED Results / Procedures / Treatments   Labs (all labs ordered are listed, but only abnormal results are displayed) Labs Reviewed  - No data to display  EKG None  Radiology No results found.  Procedures Procedures    Medications Ordered in ED Medications - No data to display  ED Course/ Medical Decision Making/ A&P                             Medical Decision Making  Patient  does not have any lymphangitic streaking.  He is not having fevers.  He overall feels well.  I doubt an infection at the PICC line.  I believe he is having a reaction to the dressing.  Most likely the chlorhexidine gel.  Will change his dressing to 1 that does not include the chlorhexidine gel.  Will have patient continue to monitor and follow-up with his infectious disease doctor        Final Clinical Impression(s) / ED Diagnoses Final diagnoses:  Irritant contact dermatitis due to drug in contact with skin    Rx / DC Orders ED Discharge Orders     None         Linwood Dibbles, MD 03/09/23 1155

## 2023-03-09 NOTE — ED Triage Notes (Signed)
Patient presents to ER for potential PICC line infection. Patient had knee surgery Jan, complicated by infection. Patient has PICC line for at home antibiotics. Patient had previous PICC line infection which was removed. This PICC line has been in for about a week. Infectious disease doctor more concerned for skin reaction than line infection. Patient denies fever. VSS, NAD.

## 2023-03-12 DIAGNOSIS — T8453XA Infection and inflammatory reaction due to internal right knee prosthesis, initial encounter: Secondary | ICD-10-CM | POA: Diagnosis not present

## 2023-03-14 ENCOUNTER — Ambulatory Visit: Payer: Self-pay

## 2023-03-14 ENCOUNTER — Telehealth: Payer: Self-pay

## 2023-03-14 DIAGNOSIS — K219 Gastro-esophageal reflux disease without esophagitis: Secondary | ICD-10-CM | POA: Diagnosis not present

## 2023-03-14 DIAGNOSIS — I10 Essential (primary) hypertension: Secondary | ICD-10-CM | POA: Diagnosis not present

## 2023-03-14 DIAGNOSIS — T8453XA Infection and inflammatory reaction due to internal right knee prosthesis, initial encounter: Secondary | ICD-10-CM | POA: Diagnosis not present

## 2023-03-14 NOTE — Telephone Encounter (Signed)
Transition Care Management Follow-up Telephone Call Date of discharge and from where: [name] [name] 6/6 How have you been since you were released from the hospital? Doing ok Any questions or concerns? No  Items Reviewed: Did the pt receive and understand the discharge instructions provided? Yes  Medications obtained and verified? Yes  Other? No  Any new allergies since your discharge? No  Dietary orders reviewed? No Do you have support at home? Yes     Follow up appointments reviewed:  PCP Hospital f/u appt confirmed? Yes  Scheduled to see [facility]  on  @ . Specialist Hospital f/u appt confirmed? Yes  Scheduled to see  on  @ . Are transportation arrangements needed? No  If their condition worsens, is the pt aware to call PCP or go to the Emergency Dept.? Yes Was the patient provided with contact information for the PCP's office or ED? Yes Was to pt encouraged to call back with questions or concerns? Yes

## 2023-03-14 NOTE — Patient Outreach (Signed)
  Care Coordination   Initial Visit Note   03/14/2023 Name: [name] [name] MRN: [id number] DOB: 1951/01/29  [name] [name] is a 72 y.o. year old male who sees [name] Broker, MD for primary care. I spoke with  [name] [name] by phone today.  What matters to the patients health and wellness today?  Admitted 02/21/23-02/25/23 excisional total knee arthroplasty with antibiotic spacers; right knee infection presented to ED on 03/09/23 with allergic reaction to PICC line dressing. [name] [name] reports it is much better. Confirm [name] home health has been out to take care of dressing. Patient and his wife administer IV antibiotics via PICC line-deny any questions. He reports he continues to follow recommendation of orthopedic provider and infectious disease provider. No specific at this time.  Goals Addressed             This Visit's Progress    Care Coordination Activities-contiue to improve post hospitalization       Interventions Today    Flowsheet Row Most Recent Value  Chronic Disease   Chronic disease during today's visit Other  [(R) knee infection with antibiotic spacers]  General Interventions   General Interventions Discussed/Reviewed General Interventions Discussed, Doctor Visits  Doctor Visits Discussed/Reviewed Doctor Visits Discussed, PCP, Specialist  [confimred patient has support in managing his care, confirmed patient has transporation to provider visits]  PCP/Specialist Visits Compliance with follow-up visit  [reviewed upcoming/scheduled provider visits]  Education Interventions   Education Provided Provided Education  Provided Verbal Education On When to see the doctor, Nutrition, Medication, Other  [advised continue to take medications as prescribed, continue to attend provider visits as scheduled, eat healthy.]  Pharmacy Interventions   Pharmacy Dicussed/Reviewed Pharmacy Topics Discussed  [medication review completed]            SDOH assessments and  interventions completed:  No recently completed-denies any changes.   Care Coordination Interventions:  Yes, provided   Follow up plan: Follow up call scheduled for 04/11/23    Encounter Outcome:  Pt. Visit Completed   Kathyrn Sheriff, RN, MSN, BSN, CCM [facility] Care Coordinator [phone]

## 2023-03-14 NOTE — Patient Instructions (Signed)
Visit Information  Thank you for taking time to visit with me today. Please don't hesitate to contact me if I can be of assistance to you.   Following are the goals we discussed today:  Continue to take medications as prescribed Continue to attend provider visits as scheduled Continue to work with home health, Frances [facility]ish as recommended Continue to eat healthy   Our next appointment is by telephone on 04/11/23 at 3:00 pm  Please call the care guide team at [phone] if you need to cancel or reschedule your appointment.   If you are experiencing a Mental Health or Behavioral Health Crisis or need someone to talk to, please call the Suicide and Crisis Lifeline: 3  Kathyrn Sheriff, RN, MSN, BSN, CCM [facility] Care Coordinator [phone]

## 2023-03-15 LAB — LAB REPORT - SCANNED: EGFR: 91

## 2023-03-18 DIAGNOSIS — T8453XA Infection and inflammatory reaction due to internal right knee prosthesis, initial encounter: Secondary | ICD-10-CM | POA: Diagnosis not present

## 2023-03-20 ENCOUNTER — Telehealth: Payer: Self-pay

## 2023-03-20 ENCOUNTER — Other Ambulatory Visit: Payer: Self-pay

## 2023-03-20 ENCOUNTER — Ambulatory Visit (INDEPENDENT_AMBULATORY_CARE_PROVIDER_SITE_OTHER)
Admission: RE | Admit: 2023-03-20 | Discharge: 2023-03-20 | Disposition: A | Discharge status: Home / Self Care | Payer: Medicare Other | Source: Ambulatory Visit | Attending: Surgery | Admitting: Surgery

## 2023-03-20 ENCOUNTER — Encounter (HOSPITAL_COMMUNITY): Payer: Self-pay

## 2023-03-20 ENCOUNTER — Other Ambulatory Visit (HOSPITAL_COMMUNITY): Payer: Self-pay | Admitting: Student

## 2023-03-20 ENCOUNTER — Emergency Department (HOSPITAL_COMMUNITY)
Admission: EM | Admit: 2023-03-20 | Discharge: 2023-03-20 | Discharge status: Against Medical Advice | Payer: Medicare Other | Attending: Emergency Medicine | Admitting: Emergency Medicine

## 2023-03-20 DIAGNOSIS — M79661 Pain in right lower leg: Secondary | ICD-10-CM | POA: Insufficient documentation

## 2023-03-20 DIAGNOSIS — R509 Fever, unspecified: Secondary | ICD-10-CM | POA: Insufficient documentation

## 2023-03-20 DIAGNOSIS — Z5321 Procedure and treatment not carried out due to patient leaving prior to being seen by health care provider: Secondary | ICD-10-CM | POA: Diagnosis not present

## 2023-03-20 DIAGNOSIS — M7989 Other specified soft tissue disorders: Secondary | ICD-10-CM | POA: Insufficient documentation

## 2023-03-20 DIAGNOSIS — Z96651 Presence of right artificial knee joint: Secondary | ICD-10-CM | POA: Insufficient documentation

## 2023-03-20 NOTE — Telephone Encounter (Signed)
Patient's wife called with multiple concerns. States that over the weekend patient had temp of 100 Saturday and Sunday. Only has elevated temp at night.  Is also having increase swelling in knee. Spouse called on call  provider for Dr. Nilsa Nutting office who ordered venous study to rule out blood clots in leg. Patient is negative for blood clots. Would like to know if maybe patient has clot in picc line that could be causing swelling. Denies any having pain, swelling, redness, fever in arm..  Requested updated picture of leg be sent on mychart for provider to see. Is concerned that antibiotics is no longer working to treat infection.  Will forward message to provider.   Juanita Laster, RMA

## 2023-03-20 NOTE — ED Notes (Signed)
Patient states he wants to leave. Has been following with pcp and other drs today and just wants to go home to be able to rest. Patient states he will come back if he feels worse. Ambulatory out of ED with spouse.

## 2023-03-20 NOTE — ED Triage Notes (Addendum)
Pt had recent right knee replacement on Jan 23. Since then pt has been dealing with infections in the the leg. Pt has been taking various abx, and has been compliant. Over the weekend pt began having fevers, and swelling in his ankles. Pt was seen at vascular today and they ruled out DVT, but pt is still concerned regarding new fevers and swelling in ankles. Pt has been taking tylenol today for fevers.

## 2023-03-21 DIAGNOSIS — T8453XA Infection and inflammatory reaction due to internal right knee prosthesis, initial encounter: Secondary | ICD-10-CM | POA: Diagnosis not present

## 2023-03-21 DIAGNOSIS — I10 Essential (primary) hypertension: Secondary | ICD-10-CM | POA: Diagnosis not present

## 2023-03-21 DIAGNOSIS — K219 Gastro-esophageal reflux disease without esophagitis: Secondary | ICD-10-CM | POA: Diagnosis not present

## 2023-03-23 LAB — LAB REPORT - SCANNED: EGFR: 83

## 2023-03-24 ENCOUNTER — Telehealth: Payer: Self-pay

## 2023-03-24 NOTE — Telephone Encounter (Signed)
Patient wife [name] [name] called stating that home health RN said CRP on 6/5 was 3 and most recent CRP is now at 60. Patient is also having swelling in lower leg/foot.   Secure chat sent to [facility] pharmacist and Dr.Singh -  Per pharmacist Margarite Gouge - yes looks like we got that fax this week - not sure why there was such an increase. ESR went from 3 to 6. Dr. Thedore Mins may want to repeat labs at his f/u on 7/2. We'll see what his labs look like next week as well!    Per Dr.Singh - he can stop by the ED ot get evaluated since lef sounds like its doing worse   I think unfortunaly since it Friday, I doubt anyone can see him today in clinic    Relayed information to patient wife - she stated she didn't think patient needed to go to ED - swelling has remained about the same since ED visit on 6/17.  Advised [name] [name] - blood work can be repeated next week with home health. if symptoms worsen or if patient develops fever or chills over the weekend patient will need to go to ED. Patient wife [name] [name] voiced her understanding.    [name] [name], CMA

## 2023-03-26 DIAGNOSIS — T8453XA Infection and inflammatory reaction due to internal right knee prosthesis, initial encounter: Secondary | ICD-10-CM | POA: Diagnosis not present

## 2023-03-27 ENCOUNTER — Telehealth: Payer: Self-pay

## 2023-03-27 NOTE — Telephone Encounter (Signed)
Transition Care Management Unsuccessful Follow-up Telephone Call  Date of discharge and from where:  [facility]  Attempts:  1st Attempt  Reason for unsuccessful TCM follow-up call:  Left voice message   Ilona Colley [facility] Guide, [place] [phone] [address], [place], [place] [address] Phone: [phone] Email: [email or web address]       

## 2023-03-27 NOTE — Telephone Encounter (Signed)
Patient's wife called, she says that [name] [name] did well over the weekend and has been feeling "okay." He hasn't had a fever in a week. She reports that the swelling below his knee is "significant," but that it hasn't gotten any worse since she started measuring it. Home health got labs on 6/18, so he is due to have repeat labs drawn by them tomorrow 6/25. Patient's wife is wanting to know if they need to be seen sooner than 7/2. Will route to provider.   Sandie Ano, RN

## 2023-03-28 ENCOUNTER — Telehealth: Payer: Self-pay

## 2023-03-28 DIAGNOSIS — D649 Anemia, unspecified: Secondary | ICD-10-CM | POA: Diagnosis not present

## 2023-03-28 DIAGNOSIS — K219 Gastro-esophageal reflux disease without esophagitis: Secondary | ICD-10-CM | POA: Diagnosis not present

## 2023-03-28 DIAGNOSIS — T8453XA Infection and inflammatory reaction due to internal right knee prosthesis, initial encounter: Secondary | ICD-10-CM | POA: Diagnosis not present

## 2023-03-28 DIAGNOSIS — I10 Essential (primary) hypertension: Secondary | ICD-10-CM | POA: Diagnosis not present

## 2023-03-28 DIAGNOSIS — E785 Hyperlipidemia, unspecified: Secondary | ICD-10-CM | POA: Diagnosis not present

## 2023-03-28 NOTE — Telephone Encounter (Signed)
Transition Care Management Unsuccessful Follow-up Telephone Call  Date of discharge and from where:  [facility] 6/17  Attempts:  2nd Attempt  Reason for unsuccessful TCM follow-up call:  Left voice message   Lenard Forth [facility] Guide, [place] [phone] [address] [place], [place], [place] [address] Phone: [phone] Email: [email or web address]

## 2023-03-29 ENCOUNTER — Encounter: Payer: Self-pay | Admitting: Pharmacist

## 2023-03-29 ENCOUNTER — Telehealth: Payer: Self-pay | Admitting: Pharmacist

## 2023-03-29 LAB — LAB REPORT - SCANNED: EGFR: 91

## 2023-03-29 NOTE — Telephone Encounter (Signed)
Patient's wife Wynona Canes called today concerned about patient's CRP being elevated this week compared to it remaining normal in the past few weeks. His ESR and WBC are still normal. She is also concerned that his knee swelling has seemed the same without improvement. States nothing has worsened over the last month though.  Awaiting labs from this week which were drawn on 6/24. Told them you would address these concerns at their follow-up visit with you next week.  Margarite Gouge, PharmD, CPP, BCIDP, AAHIVP Clinical Pharmacist Practitioner Infectious Diseases Clinical Pharmacist [facility]

## 2023-04-02 DIAGNOSIS — T8453XA Infection and inflammatory reaction due to internal right knee prosthesis, initial encounter: Secondary | ICD-10-CM | POA: Diagnosis not present

## 2023-04-03 NOTE — Telephone Encounter (Signed)
Pharmacy team spoke with patient on 6/26 to address concerns, no further action needed per Dr. Thedore Mins.   Sandie Ano, RN

## 2023-04-04 ENCOUNTER — Ambulatory Visit: Payer: Medicare Other | Admitting: Internal Medicine

## 2023-04-04 ENCOUNTER — Encounter: Payer: Self-pay | Admitting: Internal Medicine

## 2023-04-04 ENCOUNTER — Other Ambulatory Visit: Payer: Self-pay

## 2023-04-04 VITALS — BP 136/81 | HR 54 | Temp 97.7°F | Ht 69.0 in | Wt 230.0 lb

## 2023-04-04 DIAGNOSIS — M009 Pyogenic arthritis, unspecified: Secondary | ICD-10-CM

## 2023-04-04 NOTE — Progress Notes (Unsigned)
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   Sepsis due to methicillin resistant Staphylococcus aureus (MRSA) (HCC) 11/18/2022   Penicillin allergy 11/18/2022   Infection of right knee (HCC) 11/14/2022   Chronic sinusitis 09/02/2022   Arthritis of right knee 09/02/2022   Lower respiratory infection 08/08/2022   Acute cough 02/01/2022   Encounter for general adult medical examination with abnormal findings 07/09/2021   BPH (benign prostatic hyperplasia) 07/09/2021   MDD (major depressive disorder), recurrent, in full remission (HCC) 07/09/2021   Essential hypertension 10/20/2008    Patient's Medications  New Prescriptions   No medications on file  Previous Medications   ALPRAZOLAM (XANAX) 1 MG TABLET    Take 1 mg by mouth at bedtime.   APPLE CIDER VINEGAR PO    Take 1 capsule by mouth daily.   CALCIUM CARBONATE (TUMS - DOSED IN MG ELEMENTAL CALCIUM) 500 MG CHEWABLE TABLET    Chew 2 tablets by mouth 2 (two) times daily as needed for indigestion or heartburn.   CEFAZOLIN (ANCEF) IVPB    Inject 2 g into the vein every 8 (eight) hours. Indication:  R-knee PJI First Dose: No - but has received previously Last Day of Therapy:  04/04/23 Labs - Once weekly:  CBC/D and BMP, ESR and CRP Method of administration: IV Push Pull PICC line at the completion of IV antibiotics Method of administration may be changed at the discretion of home infusion pharmacist based upon assessment of the patient and/or caregiver's ability to self-administer the medication ordered.   ERGOCALCIFEROL (VITAMIN D2) 50 MCG (2000 UT) TABS    Take 2,000 Units by mouth daily.   KETOTIFEN FUMARATE (ALLERGY EYE DROPS OP)    Place 1 drop into both eyes daily as needed (red/itchy eyes).   MAGNESIUM 500 MG TABS    Take 500 mg by mouth daily.   METRONIDAZOLE (FLAGYL) 500 MG TABLET    Take 500 mg by mouth 3 (three) times daily.   METRONIDAZOLE  (FLAGYL) 500 MG TABLET    Take 1 tablet (500 mg total) by mouth 3 (three) times daily.   MULTIPLE VITAMIN (MULTI-VITAMIN PO)    Take 1 tablet by mouth daily.   OMEGA-3 FATTY ACIDS (FISH OIL) 1200 MG CAPS    Take 1,200 mg by mouth daily.   OXYCODONE 10 MG TABS    Take 1-1.5 tablets (10-15 mg total) by mouth every 4 (four) hours as needed for severe pain.   POLYETHYLENE GLYCOL (MIRALAX / GLYCOLAX) 17 G PACKET    Take 17 g by mouth 2 (two) times daily.   SACCHAROMYCES BOULARDII (FLORASTOR) 250 MG CAPSULE    Take 250 mg by mouth 2 (two) times daily.   SERTRALINE (ZOLOFT) 100 MG TABLET    TAKE 1 TABLET BY MOUTH EVERY DAY   SODIUM CHLORIDE (OCEAN) 0.65 % SOLN NASAL SPRAY    Place 1 spray into both nostrils as needed for congestion.   TAMSULOSIN (FLOMAX) 0.4 MG CAPS CAPSULE    Take 0.4 mg by mouth every evening.   TELMISARTAN (MICARDIS) 80 MG TABLET    TAKE 1 TABLET BY MOUTH EVERY DAY   TIZANIDINE (ZANAFLEX) 2 MG TABLET    Take 1-2 tablets (2-4 mg total) by mouth every 8 (eight) hours as needed for muscle spasms (muscle pain).   VITAMIN E 670 MG (1000 UT) CAPS    Take 670 mg  by mouth daily.   ZINC 30 MG TABS    Take 30 mg by mouth daily.   ZOLPIDEM (AMBIEN) 5 MG TABLET    Take 1 tablet (5 mg total) by mouth at bedtime as needed for sleep.  Modified Medications   No medications on file  Discontinued Medications   No medications on file    Subjective: 72 year old male with past medical history of below including right knee PJI with MSSA status post I&D on 11/15/2012 treated with IV cefazolin admitted with right knee PJI.  Underwent resection of right knee arthroplasty and spacer placement.  He was initially started on Dapto and then transition to cefazolin.  Added metronidazole as cultures grew Finegoldia magna.  Of note patient states he has been adherent to suppressive cefadroxil without missed dose.  Regardless given MSSA history we covered with cefazolin.  03/08/2023: Doing well.  Tolerating  antibiotics.  Noticed some drainage at PICC line site, no fevers or chills.  Interim: Seen iin the ED and noted to be reaction for dressing, which was changed. Today 04/04/23: No new complaints. Reports he was seen by vascular in the interim as knee was swollen. States he was told that poor lymphatics were contributing to knee swelling.   Review of Systems: Review of Systems  All other systems reviewed and are negative.   Past Medical History:  Diagnosis Date   Arthritis    Bronchitis    Depression    GERD (gastroesophageal reflux disease)    Hypertension    Skin cancer     Social History   Tobacco Use   Smoking status: Never   Smokeless tobacco: Never  Vaping Use   Vaping Use: Never used  Substance Use Topics   Alcohol use: Yes    Comment: socially   Drug use: No    Family History  Problem Relation Age of Onset   Emphysema Mother        heart failure indusce emphysema   Heart disease Father    Heart attack Father    Heart disease Sister 82       [id number]    Allergies  Allergen Reactions   Avapro [Irbesartan] Cough   Lexapro [Escitalopram Oxalate] Other (See Comments)    Anxiety, insomnia   Chlorhexidine Rash    Reaction to PICC line dressing with Chlorhexidine gel    Health Maintenance  Topic Date Due   Colonoscopy  Never done   COVID-19 Vaccine (6 - 2023-24 season) 06/03/2022   Zoster Vaccines- Shingrix (1 of 2) 04/08/2023 (Originally 04/22/2002)   INFLUENZA VACCINE  05/04/2023   Medicare Annual Wellness (AWV)  07/15/2023   DTaP/Tdap/Td (4 - Td or Tdap) 04/02/2029   Pneumonia Vaccine 48+ Years old  Completed   Hepatitis C Screening  Completed   HPV VACCINES  Aged Out    Objective:  There were no vitals filed for this visit. There is no height or weight on file to calculate BMI.  Physical Exam Constitutional:      General: He is not in acute distress.    Appearance: He is normal weight. He is not toxic-appearing.  HENT:     Head: Normocephalic and  atraumatic.     Right Ear: External ear normal.     Left Ear: External ear normal.     Nose: No congestion or rhinorrhea.     Mouth/Throat:     Mouth: Mucous membranes are moist.     Pharynx: Oropharynx is clear.  Eyes:  Extraocular Movements: Extraocular movements intact.     Conjunctiva/sclera: Conjunctivae normal.     Pupils: Pupils are equal, round, and reactive to light.  Cardiovascular:     Rate and Rhythm: Normal rate and regular rhythm.     Heart sounds: No murmur heard.    No friction rub. No gallop.  Pulmonary:     Effort: Pulmonary effort is normal.     Breath sounds: Normal breath sounds.  Abdominal:     General: Abdomen is flat. Bowel sounds are normal.     Palpations: Abdomen is soft.  Musculoskeletal:        General: No swelling.     Cervical back: Normal range of motion and neck supple.  Skin:    General: Skin is warm and dry.  Neurological:     General: No focal deficit present.     Mental Status: He is oriented to person, place, and time.  Psychiatric:        Mood and Affect: Mood normal.     Lab Results Lab Results  Component Value Date   WBC 7.7 02/23/2023   HGB 9.4 (L) 02/23/2023   HCT 29.4 (L) 02/23/2023   MCV 87.2 02/23/2023   PLT 187 02/23/2023    Lab Results  Component Value Date   CREATININE 0.79 02/22/2023   BUN 13 02/22/2023   NA 136 02/22/2023   K 4.0 02/22/2023   CL 104 02/22/2023   CO2 26 02/22/2023    Lab Results  Component Value Date   ALT 22 07/07/2022   AST 17 07/07/2022   ALKPHOS 50 07/07/2022   BILITOT 0.8 07/07/2022    Lab Results  Component Value Date   CHOL 195 07/07/2022   HDL 76.80 07/07/2022   LDLCALC 109 (H) 07/07/2022   TRIG 45.0 07/07/2022   CHOLHDL 3 07/07/2022   No results found for: "LABRPR", "RPRTITER" No results found for: "HIV1RNAQUANT", "HIV1RNAVL", "CD4TABS"   Problem List Items Addressed This Visit   None  Assessment/Plan #Right knee PJI status post resection of right knee arthroplasty  with placement of spacer with Cx+ fingoldia magna #History of right knee PJI with MSSA status post I&D on 11/15/2022 -Continue cefazolin  + metronidazole x 6 weeks EOT on 7/2. PULL picc today -Pt seen by vascular due rle edema, though to be due to poor lymphatics. I suspect this liky contributed to his clinical presentation at last hospitalization. As esr/crp normal do not suspecdt underlying infection at this point.  -F/U with ID on 8/26 to labs off of abx     #medicaiton monitoring -wc 5.2, scr 0.91,esr 2, crp 2 on 03/28/23, 6/18 ctp 62 6/11 3. The 6/18 crp value likely lab error -Dr. Charlann Boxer saw pt on 03/08/23 dressing off and aspirated knee"pssible heamtoma"      #PICC line issues Went ot ED due to erythema below picc line dressing->thouht ot reaction to dressing, changed to dressing w/o chlorhexidine gel   Danelle Earthly, MD [facility] 04/04/2023, 4:46 AM   I have personally spent 32 minutes involved in face-to-face and non-face-to-face activities for this patient on the day of the visit. Professional time spent includes the following activities: Preparing to see the patient (review of tests), Obtaining and/or reviewing separately obtained history (admission/discharge record), Performing a medically appropriate examination and/or evaluation , Ordering medications/tests/procedures, referring and communicating with other health care professionals, Documenting clinical information in the EMR, Independently interpreting results (not separately reported), Communicating results to the  patient/family/caregiver, Counseling and educating the patient/family/caregiver and Care coordination (not separately reported).

## 2023-04-04 NOTE — Progress Notes (Unsigned)
PICC Removal    PICC length & location:  right brachial 42 cm Removed per verbal order from: Dr. Thedore Mins  Blood thinners:  none Platelet count:  256 ([facility] 03/28/23)  Site assessment: Dressing clean and dry. Extremity warm and dry. No redness, drainage, or swelling present at insertion site.   Pre-removal vital signs:  BP:  138/83 HR:  54 SpO2:  100%  Insertion site positioned below level of heart. No sutures present. Insertion site cleaned with alcohol due to previous adverse reaction with CHG, catheter removed and petroleum dressing applied. Tip intact. Pressure held until hemostasis achieved.    Length of catheter removed:  42 cm   Provided patient with after care instructions and precautions print out (via [employer]). Reviewed this information with patient.   Patient verbalized understanding and agreement, all questions answered. Patient tolerated procedure well and remained in clinic under the care of RN 30 minutes post removal.  Post-observation vital signs:  BP:  136/81 HR:  54 SpO2:  98%  Notified Jeri Modena, RN with [facility] and [facility] pharmacy team of removal.  Sandie Ano, RN

## 2023-04-05 DIAGNOSIS — I10 Essential (primary) hypertension: Secondary | ICD-10-CM | POA: Diagnosis not present

## 2023-04-05 DIAGNOSIS — K219 Gastro-esophageal reflux disease without esophagitis: Secondary | ICD-10-CM | POA: Diagnosis not present

## 2023-04-05 DIAGNOSIS — T8453XA Infection and inflammatory reaction due to internal right knee prosthesis, initial encounter: Secondary | ICD-10-CM | POA: Diagnosis not present

## 2023-04-11 ENCOUNTER — Ambulatory Visit: Payer: Self-pay

## 2023-04-11 NOTE — Patient Outreach (Signed)
  Care Coordination   Follow Up Visit Note   04/11/2023 Name: TALIK CASIQUE MRN: [id number] DOB: May 23, 1951  [name] is a 72 y.o. year old male who sees Myrlene Broker, MD for primary care. I spoke with  [name] by phone today.  What matters to the patients health and wellness today?  Patient reports doing well. He reports PICC line removed, Lymphedema improved. He reports he is excited to have a date for reimplantation/revision surgery on 06/01/23. He is without questions or concerns at this time.  Goals Addressed             This Visit's Progress    Care Coordination Activities-contiue to improve post hospitalization       Interventions Today    Flowsheet Row Most Recent Value  Chronic Disease   Chronic disease during today's visit Other  General Interventions   General Interventions Discussed/Reviewed General Interventions Reviewed, Doctor Visits  Doctor Visits Discussed/Reviewed Doctor Visits Reviewed  [reviewed upcoming scheduled visits]  Education Interventions   Education Provided Provided Education  Provided Verbal Education On Other  [advised to continue to attend appointments as recommended,  take medications as prescribed, contact provider with health questions concerns.]  Nutrition Interventions   Nutrition Discussed/Reviewed Nutrition Reviewed  Pharmacy Interventions   Pharmacy Dicussed/Reviewed Pharmacy Topics Reviewed  Safety Interventions   Safety Discussed/Reviewed Safety Reviewed            SDOH assessments and interventions completed:  No  Care Coordination Interventions:  Yes, provided   Follow up plan: Follow up call scheduled for 06/15/23    Encounter Outcome:  Pt. Visit Completed   Kathyrn Sheriff, RN, MSN, BSN, CCM [facility] Care Coordinator [phone]

## 2023-04-11 NOTE — Patient Instructions (Addendum)
Visit Information  Thank you for taking time to visit with me today. Please don't hesitate to contact me if I can be of assistance to you.   Following are the goals we discussed today:  Continue to take medications as prescribed Continue to attend provider visits as scheduled Contact your provider with health questions or concerns as needed   Our next appointment is by telephone on 06/15/23 at 3:00 pm  Please call the care guide team at [phone] if you need to cancel or reschedule your appointment.   If you are experiencing a Mental Health or Behavioral Health Crisis or need someone to talk to, please call the Suicide and Crisis Lifeline: 59  Kathyrn Sheriff, RN, MSN, BSN, CCM [facility] Care Coordinator [phone]

## 2023-04-19 ENCOUNTER — Encounter: Payer: Self-pay | Admitting: Internal Medicine

## 2023-04-19 ENCOUNTER — Telehealth: Payer: Self-pay | Admitting: Internal Medicine

## 2023-04-19 DIAGNOSIS — Z96651 Presence of right artificial knee joint: Secondary | ICD-10-CM | POA: Diagnosis not present

## 2023-04-19 DIAGNOSIS — Z471 Aftercare following joint replacement surgery: Secondary | ICD-10-CM | POA: Diagnosis not present

## 2023-04-19 NOTE — Telephone Encounter (Signed)
Patient's wife called and said orders for lab work were faxed over by [facility]. They would like a call when that is received and orders are put in so the can come get those labs. Best callback is [phone].

## 2023-04-19 NOTE — Telephone Encounter (Signed)
I have placed lab orders for the patient to have done in order to be cleared for surgery.

## 2023-04-21 ENCOUNTER — Other Ambulatory Visit: Payer: Medicare Other

## 2023-04-21 DIAGNOSIS — Z96651 Presence of right artificial knee joint: Secondary | ICD-10-CM

## 2023-04-21 NOTE — Addendum Note (Signed)
Addended by: Levonne Lapping on: 04/21/2023 09:43 AM   Modules accepted: Orders

## 2023-04-22 LAB — SEDIMENTATION RATE: Sed Rate: 2 mm/h (ref 0–20)

## 2023-04-22 LAB — C-REACTIVE PROTEIN: CRP: 3.2 mg/L (ref <8.0)

## 2023-05-21 NOTE — Patient Instructions (Signed)
SURGICAL WAITING ROOM VISITATION Patients having surgery or a procedure may have no more than 2 support people in the waiting area - these visitors may rotate in the visitor waiting room.   Due to an increase in RSV and influenza rates and associated hospitalizations, children ages 55 and under may not visit patients in [facility] hospitals. If the patient needs to stay at the hospital during part of their recovery, the visitor guidelines for inpatient rooms apply.  PRE-OP VISITATION  Pre-op nurse will coordinate an appropriate time for 1 support person to accompany the patient in pre-op.  This support person may not rotate.  This visitor will be contacted when the time is appropriate for the visitor to come back in the pre-op area.  Please refer to the [facility] website for the visitor guidelines for Inpatients (after your surgery is over and you are in a regular room).  You are not required to quarantine at this time prior to your surgery. However, you must do this: Hand Hygiene often Do NOT share personal items Notify your provider if you are in close contact with someone who has COVID or you develop fever 100.4 or greater, new onset of sneezing, cough, sore throat, shortness of breath or body aches.  If you test positive for Covid or have been in contact with anyone that has tested positive in the last 10 days please notify you surgeon.    Your procedure is scheduled on: THURSDAY  June 01, 2023   Report to [facility] Main Entrance: [facility]sen entrance where the [employer] is available.   Report to admitting at: 08:15    AM  Call this number if you have any questions or problems the morning of surgery [phone]  Do not eat food after Midnight the night prior to your surgery/procedure.  After Midnight you may have the following liquids until  07:45 AM  DAY OF SURGERY  Clear Liquid Diet Water Black Coffee (sugar ok, NO MILK/CREAM OR CREAMERS)  Tea (sugar ok, NO  MILK/CREAM OR CREAMERS) regular and decaf                             Plain Jell-O  with no fruit (NO RED)                                           Fruit ices (not with fruit pulp, NO RED)                                     Popsicles (NO RED)                                                                  Juice: NO CITRUS JUICES: only apple, WHITE grape, WHITE cranberry Sports drinks like Gatorade or Powerade (NO RED)                   The day of surgery:  Drink ONE (1) Pre-Surgery Clear Ensure at  07:45 AM the morning of surgery.  Drink in one sitting. Do not sip.  This drink was given to you during your hospital pre-op appointment visit. Nothing else to drink after completing the Pre-Surgery Clear Ensure : No candy, chewing gum or throat lozenges.    FOLLOW ANY ADDITIONAL PRE OP INSTRUCTIONS YOU RECEIVED FROM YOUR SURGEON'S OFFICE!!!   Oral Hygiene is also important to reduce your risk of infection.        Remember - BRUSH YOUR TEETH THE MORNING OF SURGERY WITH YOUR REGULAR TOOTHPASTE  Do NOT smoke after Midnight the night before surgery.  STOP TAKING all Vitamins, Herbs and supplements 1 week before your surgery.   Take ONLY these medicines the morning of surgery with A SIP OF WATER: sertraline (Zoloft), You may take Gabapentin,if needed. You may use your Eye drops and Nasal spray if needed.                     You may not have any metal on your body including jewelry, and body piercing  Do not wear  lotions, powders, cologne, or deodorant  Men may shave face and neck.  You may bring a small overnight bag with you on the day of surgery, only pack items that are not valuable. [facility] IS NOT RESPONSIBLE   FOR VALUABLES THAT ARE LOST OR STOLEN.   Do not bring your home medications to the hospital. The Pharmacy will dispense medications listed on your medication list to you during your admission in the Hospital.  Special Instructions: Bring a copy of your healthcare power of  attorney and living will documents the day of surgery, if you wish to have them scanned into your  Medical Records- EPIC  Please read over the following fact sheets you were given: IF YOU HAVE QUESTIONS ABOUT YOUR PRE-OP INSTRUCTIONS, PLEASE CALL [phone]   SINCE YOU HAVE A CHG (chlorahexidine gluconate) ALLERGY Please follow the following instructions:   [place] - Preparing for Surgery Before surgery, you can play an important role.  Because skin is not sterile, your skin needs to be as free of germs as possible.  You can reduce the number of germs on your skin by washing with Antibacterial soap before surgery.  .  You may shave your face/neck.  Please follow these instructions carefully:  1.  Shower with antibacterial Soap the night before surgery and the  morning of surgery.  2.  If you choose to wash your hair, wash your hair first as usual with your normal  shampoo.  3.  After you shampoo, rinse your hair and body thoroughly to remove the shampoo.                             4.  You can apply soap directly to the skin and wash.  Gently with a scrungie or clean washcloth.  5.  Wash face,  Genitals (private parts) with your normal soap.             6.  Wash thoroughly, paying special attention to the area where your  surgery  will be performed.  7.  Thoroughly rinse your body with warm water from the neck down.  8.   Pat yourself dry with a clean towel.             9  Wear clean pajamas.            10 Place clean sheets on your bed the  night of your first shower and do not  sleep with pets.  ON THE DAY OF SURGERY : Do not apply any lotions/deodorants the morning of surgery.  Please wear clean clothes to the hospital/surgery center.  FAILURE TO FOLLOW THESE INSTRUCTIONS MAY RESULT IN THE CANCELLATION OF YOUR SURGERY  PATIENT SIGNATURE_________________________________  NURSE SIGNATURE__________________________________      Incentive Spirometer    An  incentive spirometer is a tool that can help keep your lungs clear and active. This tool measures how well you are filling your lungs with each breath. Taking long deep breaths may help reverse or decrease the chance of developing breathing (pulmonary) problems (especially infection) following: A long period of time when you are unable to move or be active. BEFORE THE PROCEDURE  If the spirometer includes an indicator to show your best effort, your nurse or respiratory therapist will set it to a desired goal. If possible, sit up straight or lean slightly forward. Try not to slouch. Hold the incentive spirometer in an upright position. INSTRUCTIONS FOR USE  Sit on the edge of your bed if possible, or sit up as far as you can in bed or on a chair. Hold the incentive spirometer in an upright position. Breathe out normally. Place the mouthpiece in your mouth and seal your lips tightly around it. Breathe in slowly and as deeply as possible, raising the piston or the ball toward the top of the column. Hold your breath for 3-5 seconds or for as long as possible. Allow the piston or ball to fall to the bottom of the column. Remove the mouthpiece from your mouth and breathe out normally. Rest for a few seconds and repeat Steps 1 through 7 at least 10 times every 1-2 hours when you are awake. Take your time and take a few normal breaths between deep breaths. The spirometer may include an indicator to show your best effort. Use the indicator as a goal to work toward during each repetition. After each set of 10 deep breaths, practice coughing to be sure your lungs are clear. If you have an incision (the cut made at the time of surgery), support your incision when coughing by placing a pillow or rolled up towels firmly against it. Once you are able to get out of bed, walk around indoors and cough well. You may stop using the incentive spirometer when instructed by your caregiver.  RISKS AND COMPLICATIONS Take  your time so you do not get dizzy or light-headed. If you are in pain, you may need to take or ask for pain medication before doing incentive spirometry. It is harder to take a deep breath if you are having pain. AFTER USE Rest and breathe slowly and easily. It can be helpful to keep track of a log of your progress. Your caregiver can provide you with a simple table to help with this. If you are using the spirometer at home, follow these instructions: SEEK MEDICAL CARE IF:  You are having difficultly using the spirometer. You have trouble using the spirometer as often as instructed. Your pain medication is not giving enough relief while using the spirometer. You develop fever of 100.5 F (38.1 C) or higher.  SEEK IMMEDIATE MEDICAL CARE IF:  You cough up bloody sputum that had not been present before. You develop fever of 102 F (38.9 C) or greater. You develop worsening pain at or near the incision site. MAKE SURE YOU:  Understand these instructions. Will watch your condition. Will get help right away if you are not doing well or get worse. Document Released: 01/30/2007 Document Revised: 12/12/2011 Document Reviewed: 04/02/2007 [facility] Patient Information 2014 [place], [place].     WHAT IS A BLOOD TRANSFUSION? Blood Transfusion Information  A transfusion is the replacement of blood or some of its parts. Blood is made up of multiple cells which provide different functions. Red blood cells carry oxygen and are used for blood loss replacement. White blood cells fight against infection. Platelets control bleeding. Plasma helps clot blood. Other blood products are available for specialized needs, such as hemophilia or other clotting disorders. BEFORE THE TRANSFUSION  Who gives blood for transfusions?  Healthy volunteers who are fully evaluated to make sure their blood is safe. This is  blood bank blood. Transfusion therapy is the safest it has ever been in the practice of medicine. Before blood is taken from a donor, a complete history is taken to make sure that person has no history of diseases nor engages in risky social behavior (examples are intravenous drug use or sexual activity with multiple partners). The donor's travel history is screened to minimize risk of transmitting infections, such as malaria. The donated blood is tested for signs of infectious diseases, such as HIV and hepatitis. The blood is then tested to be sure it is compatible with you in order to minimize the chance of a transfusion reaction. If you or a relative donates blood, this is often done in anticipation of surgery and is not appropriate for emergency situations. It takes many days to process the donated blood. RISKS AND COMPLICATIONS Although transfusion therapy is very safe and saves many lives, the main dangers of transfusion include:  Getting an infectious disease. Developing a transfusion reaction. This is an allergic reaction to something in the blood you were given. Every precaution is taken to prevent this. The decision to have a blood transfusion has been considered carefully by your caregiver before blood is given. Blood is not given unless the benefits outweigh the risks. AFTER THE TRANSFUSION Right after receiving a blood transfusion, you will usually feel much better and more energetic. This is especially true if your red blood cells have gotten low (anemic). The transfusion raises the level of the red blood cells which carry oxygen, and this usually causes an energy increase. The nurse administering the transfusion will monitor you carefully for complications. HOME CARE INSTRUCTIONS  No special instructions are needed after a transfusion. You may find your energy is better. Speak with your caregiver about any limitations on activity for underlying diseases you may have. SEEK MEDICAL CARE IF:   Your condition is not improving after your transfusion. You develop redness or irritation at the intravenous (IV) site. SEEK IMMEDIATE MEDICAL CARE IF:  Any of the following symptoms occur over the next 12 hours: Shaking chills. You have a temperature by mouth above 102 F (38.9 C), not controlled by medicine. Chest, back, or muscle pain. People around you feel you are not acting correctly or are confused. Shortness of breath or difficulty breathing. Dizziness and fainting. You get a rash or develop hives. You have a decrease in urine output. Your urine turns a dark color or changes to pink, red,  or brown. Any of the following symptoms occur over the next 10 days: You have a temperature by mouth above 102 F (38.9 C), not controlled by medicine. Shortness of breath. Weakness after normal activity. The white part of the eye turns yellow (jaundice). You have a decrease in the amount of urine or are urinating less often. Your urine turns a dark color or changes to pink, red, or brown. Document Released: 09/16/2000 Document Revised: 12/12/2011 Document Reviewed: 05/05/2008 [facility] Patient Information 2014 [place], [place].  _______________________________________________________________________

## 2023-05-21 NOTE — Progress Notes (Signed)
COVID Vaccine received:  []  No [x]  Yes Date of any COVID positive Test in last 90 days:  PCP - Hillard Danker, MD  Cardiologist - Chilton Si, MD  (LOV 2016 for HLD) ID- Danelle Earthly, MD  Chest x-ray - 08-08-2022  Epic EKG -  07-14-2022  Epic Stress Test -  ECHO -  Cardiac Cath -   PCR screen: [x]  Ordered & Completed           []   No Order but Needs PROFEND           []   N/A for this surgery  Surgery Plan:  []  Ambulatory                            []  Outpatient in bed                            [x]  Admit  Anesthesia:    []  General  [x]  Spinal                           []   Choice []   MAC  Pacemaker / ICD device [x]  No []  Yes   Spinal Cord Stimulator:[x]  No []  Yes       History of Sleep Apnea? [x]  No []  Yes   CPAP used?- [x]  No []  Yes    Does the patient monitor blood sugar?          []  No []  Yes  [x]  N/A  Patient has: [x]  NO Hx DM   []  Pre-DM                 []  DM1  []   DM2 Does patient have a [employer] or [employer]? []  No []  Yes   Fasting Blood Sugar Ranges-  Checks Blood Sugar _____ times a day  Blood Thinner / Instructions:  none Aspirin Instructions:  none  ERAS Protocol Ordered: []  No  [x]  Yes PRE-SURGERY [x]  ENSURE  []  G2  Patient is to be NPO after: 0745  Comments: Patient was NOT given the 5 CHG shower / bath instructions for TKA arthroplasty surgery .  He has CHG ALLERGY and will use regular ANTIBACTERIAL SOAP. Patient will start this UU:VOZDGU  05-28-2023  All questions were asked and answered, Patient voiced understanding of this process.    Activity level: Patient is able / unable to climb a flight of stairs without difficulty; []  No CP  []  No SOB, but would have ___   Patient can / can not perform ADLs without assistance.   Anesthesia review: HTN, MDD, GERD  Patient denies shortness of breath, fever, cough and chest pain at PAT appointment.  Patient verbalized understanding and agreement to the Pre-Surgical Instructions that were given to  them at this PAT appointment. Patient was also educated of the need to review these PAT instructions again prior to his surgery.I reviewed the appropriate phone numbers to call if they have any and questions or concerns.

## 2023-05-23 ENCOUNTER — Encounter (HOSPITAL_COMMUNITY): Payer: Self-pay

## 2023-05-23 ENCOUNTER — Encounter (HOSPITAL_COMMUNITY)
Admission: RE | Admit: 2023-05-23 | Discharge: 2023-05-23 | Disposition: A | Discharge status: Home / Self Care | Payer: Medicare Other | Source: Ambulatory Visit | Attending: Orthopedic Surgery | Admitting: Orthopedic Surgery

## 2023-05-23 ENCOUNTER — Other Ambulatory Visit: Payer: Self-pay

## 2023-05-23 VITALS — BP 148/75 | HR 62 | Temp 98.6°F | Resp 16 | Ht 69.0 in | Wt 230.0 lb

## 2023-05-23 DIAGNOSIS — Z01818 Encounter for other preprocedural examination: Secondary | ICD-10-CM

## 2023-05-23 DIAGNOSIS — I10 Essential (primary) hypertension: Secondary | ICD-10-CM | POA: Diagnosis not present

## 2023-05-23 DIAGNOSIS — Z01812 Encounter for preprocedural laboratory examination: Secondary | ICD-10-CM | POA: Insufficient documentation

## 2023-05-23 LAB — CBC
HCT: 42.6 % (ref 39.0–52.0)
Hemoglobin: 13.6 g/dL (ref 13.0–17.0)
MCH: 29.1 pg (ref 26.0–34.0)
MCHC: 31.9 g/dL (ref 30.0–36.0)
MCV: 91.2 fL (ref 80.0–100.0)
Platelets: 158 10*3/uL (ref 150–400)
RBC: 4.67 MIL/uL (ref 4.22–5.81)
RDW: 17.7 % — ABNORMAL HIGH (ref 11.5–15.5)
WBC: 6 10*3/uL (ref 4.0–10.5)
nRBC: 0 % (ref 0.0–0.2)

## 2023-05-23 LAB — BASIC METABOLIC PANEL
Anion gap: 9 (ref 5–15)
BUN: 22 mg/dL (ref 8–23)
CO2: 23 mmol/L (ref 22–32)
Calcium: 9.1 mg/dL (ref 8.9–10.3)
Chloride: 104 mmol/L (ref 98–111)
Creatinine, Ser: 0.8 mg/dL (ref 0.61–1.24)
GFR, Estimated: >60 mL/min (ref >60)
Glucose, Bld: 95 mg/dL (ref 70–99)
Potassium: 4 mmol/L (ref 3.5–5.1)
Sodium: 136 mmol/L (ref 135–145)

## 2023-05-23 LAB — SURGICAL PCR SCREEN
MRSA, PCR: NEGATIVE
Staphylococcus aureus: POSITIVE — AB

## 2023-05-23 NOTE — Progress Notes (Signed)
Patient's PCR screen is positive for  [facility]. Appropriate notes have been placed on the patient's chart. This note has been routed to Dr. Charlann Boxer and Rosalene Billings PA for review. The Patient's surgery is currently scheduled for: 06-01-2023 at [facility].  Rudean Haskell, BSN, CVRN-BC   Pre-Surgical Testing Nurse [facility]  [phone]

## 2023-05-29 ENCOUNTER — Ambulatory Visit: Payer: Medicare Other | Admitting: Internal Medicine

## 2023-06-01 ENCOUNTER — Encounter (HOSPITAL_COMMUNITY)
Admission: RE | Disposition: A | Discharge status: Home / Self Care | Payer: Self-pay | Source: Home / Self Care | Attending: Orthopedic Surgery

## 2023-06-01 ENCOUNTER — Inpatient Hospital Stay (HOSPITAL_COMMUNITY): Payer: Medicare Other | Admitting: Certified Registered"

## 2023-06-01 ENCOUNTER — Other Ambulatory Visit: Payer: Self-pay

## 2023-06-01 ENCOUNTER — Encounter (HOSPITAL_COMMUNITY): Payer: Self-pay | Admitting: Orthopedic Surgery

## 2023-06-01 ENCOUNTER — Inpatient Hospital Stay (HOSPITAL_COMMUNITY)
Admission: RE | Admit: 2023-06-01 | Discharge: 2023-06-03 | DRG: 468 | Disposition: A | Discharge status: Home / Self Care | Payer: Medicare Other | Attending: Orthopedic Surgery | Admitting: Orthopedic Surgery

## 2023-06-01 DIAGNOSIS — Z8619 Personal history of other infectious and parasitic diseases: Secondary | ICD-10-CM

## 2023-06-01 DIAGNOSIS — E669 Obesity, unspecified: Secondary | ICD-10-CM | POA: Diagnosis present

## 2023-06-01 DIAGNOSIS — Z85828 Personal history of other malignant neoplasm of skin: Secondary | ICD-10-CM

## 2023-06-01 DIAGNOSIS — N400 Enlarged prostate without lower urinary tract symptoms: Secondary | ICD-10-CM | POA: Diagnosis present

## 2023-06-01 DIAGNOSIS — K219 Gastro-esophageal reflux disease without esophagitis: Secondary | ICD-10-CM | POA: Diagnosis present

## 2023-06-01 DIAGNOSIS — Z880 Allergy status to penicillin: Secondary | ICD-10-CM | POA: Diagnosis not present

## 2023-06-01 DIAGNOSIS — T8453XA Infection and inflammatory reaction due to internal right knee prosthesis, initial encounter: Secondary | ICD-10-CM

## 2023-06-01 DIAGNOSIS — I10 Essential (primary) hypertension: Secondary | ICD-10-CM | POA: Diagnosis not present

## 2023-06-01 DIAGNOSIS — Z79899 Other long term (current) drug therapy: Secondary | ICD-10-CM | POA: Diagnosis not present

## 2023-06-01 DIAGNOSIS — Z96651 Presence of right artificial knee joint: Principal | ICD-10-CM

## 2023-06-01 DIAGNOSIS — Z825 Family history of asthma and other chronic lower respiratory diseases: Secondary | ICD-10-CM | POA: Diagnosis not present

## 2023-06-01 DIAGNOSIS — Y831 Surgical operation with implant of artificial internal device as the cause of abnormal reaction of the patient, or of later complication, without mention of misadventure at the time of the procedure: Secondary | ICD-10-CM | POA: Diagnosis present

## 2023-06-01 DIAGNOSIS — T84092A Other mechanical complication of internal right knee prosthesis, initial encounter: Secondary | ICD-10-CM | POA: Diagnosis not present

## 2023-06-01 DIAGNOSIS — Z888 Allergy status to other drugs, medicaments and biological substances status: Secondary | ICD-10-CM | POA: Diagnosis not present

## 2023-06-01 DIAGNOSIS — Z8249 Family history of ischemic heart disease and other diseases of the circulatory system: Secondary | ICD-10-CM | POA: Diagnosis not present

## 2023-06-01 DIAGNOSIS — F3342 Major depressive disorder, recurrent, in full remission: Secondary | ICD-10-CM | POA: Diagnosis present

## 2023-06-01 DIAGNOSIS — M1711 Unilateral primary osteoarthritis, right knee: Secondary | ICD-10-CM | POA: Diagnosis present

## 2023-06-01 DIAGNOSIS — S83003A Unspecified subluxation of unspecified patella, initial encounter: Secondary | ICD-10-CM | POA: Diagnosis present

## 2023-06-01 DIAGNOSIS — Z6833 Body mass index (BMI) 33.0-33.9, adult: Secondary | ICD-10-CM | POA: Diagnosis not present

## 2023-06-01 HISTORY — PX: REIMPLANTATION OF TOTAL KNEE: SHX6052

## 2023-06-01 LAB — TYPE AND SCREEN
ABO/RH(D): A POS
Antibody Screen: NEGATIVE

## 2023-06-01 LAB — ABO/RH: ABO/RH(D): A POS

## 2023-06-01 SURGERY — REVISION, TOTAL ARTHROPLASTY, KNEE
Anesthesia: Monitor Anesthesia Care | Site: Knee | Laterality: Right

## 2023-06-01 MED ORDER — SODIUM CHLORIDE 0.9 % IR SOLN
Status: DC | PRN
  Administered 2023-06-01: 3000 mL

## 2023-06-01 MED ORDER — BUPIVACAINE-EPINEPHRINE 0.25% -1:200000 IJ SOLN
INTRAMUSCULAR | Status: AC
  Filled 2023-06-01: qty 1

## 2023-06-01 MED ORDER — TAMSULOSIN HCL 0.4 MG PO CAPS
0.4000 mg | ORAL_CAPSULE | Freq: Every day | ORAL | Status: DC
  Administered 2023-06-01 – 2023-06-02 (×2): 0.4 mg via ORAL
  Filled 2023-06-01 (×2): qty 1

## 2023-06-01 MED ORDER — MENTHOL 3 MG MT LOZG: 1.0000 | LOZENGE | OROMUCOSAL | Status: DC | PRN

## 2023-06-01 MED ORDER — METOCLOPRAMIDE HCL 5 MG PO TABS: 5.0000 mg | ORAL_TABLET | Freq: Three times a day (TID) | ORAL | Status: DC | PRN

## 2023-06-01 MED ORDER — ACETAMINOPHEN 500 MG PO TABS
1000.0000 mg | ORAL_TABLET | Freq: Four times a day (QID) | ORAL | Status: DC
  Administered 2023-06-01 – 2023-06-03 (×6): 1000 mg via ORAL
  Filled 2023-06-01 (×7): qty 2

## 2023-06-01 MED ORDER — POVIDONE-IODINE 10 % EX SWAB
2.0000 | Freq: Once | CUTANEOUS | Status: AC
  Administered 2023-06-01: 2 via TOPICAL

## 2023-06-01 MED ORDER — PHENOL 1.4 % MT LIQD: 1.0000 | OROMUCOSAL | Status: DC | PRN

## 2023-06-01 MED ORDER — SERTRALINE HCL 100 MG PO TABS
100.0000 mg | ORAL_TABLET | Freq: Every day | ORAL | Status: DC
  Administered 2023-06-01 – 2023-06-03 (×3): 100 mg via ORAL
  Filled 2023-06-01 (×3): qty 1

## 2023-06-01 MED ORDER — METOCLOPRAMIDE HCL 5 MG/ML IJ SOLN: 5.0000 mg | Freq: Three times a day (TID) | INTRAMUSCULAR | Status: DC | PRN

## 2023-06-01 MED ORDER — KETOROLAC TROMETHAMINE 30 MG/ML IJ SOLN
INTRAMUSCULAR | Status: AC
  Filled 2023-06-01: qty 1

## 2023-06-01 MED ORDER — PROPOFOL 10 MG/ML IV BOLUS
INTRAVENOUS | Status: DC | PRN
Start: 2023-06-01 — End: 2023-06-01
  Administered 2023-06-01: 40 mg via INTRAVENOUS

## 2023-06-01 MED ORDER — PHENYLEPHRINE 80 MCG/ML (10ML) SYRINGE FOR IV PUSH (FOR BLOOD PRESSURE SUPPORT)
PREFILLED_SYRINGE | INTRAVENOUS | Status: AC
  Filled 2023-06-01: qty 10

## 2023-06-01 MED ORDER — TRANEXAMIC ACID-NACL 1000-0.7 MG/100ML-% IV SOLN
1000.0000 mg | INTRAVENOUS | Status: AC
  Administered 2023-06-01: 1000 mg via INTRAVENOUS
  Filled 2023-06-01: qty 100

## 2023-06-01 MED ORDER — VALSARTAN 40 MG PO TABS
160.0000 mg | ORAL_TABLET | Freq: Every day | ORAL | Status: DC
  Filled 2023-06-01 (×2): qty 4

## 2023-06-01 MED ORDER — POLYETHYLENE GLYCOL 3350 17 G PO PACK
17.0000 g | PACK | Freq: Two times a day (BID) | ORAL | Status: DC
  Administered 2023-06-01 – 2023-06-03 (×4): 17 g via ORAL
  Filled 2023-06-01 (×4): qty 1

## 2023-06-01 MED ORDER — LACTATED RINGERS IV SOLN: INTRAVENOUS | Status: DC

## 2023-06-01 MED ORDER — BISACODYL 10 MG RE SUPP: 10.0000 mg | Freq: Every day | RECTAL | Status: DC | PRN

## 2023-06-01 MED ORDER — ONDANSETRON HCL 4 MG/2ML IJ SOLN
INTRAMUSCULAR | Status: AC
  Filled 2023-06-01: qty 2

## 2023-06-01 MED ORDER — EPHEDRINE SULFATE-NACL 50-0.9 MG/10ML-% IV SOSY
PREFILLED_SYRINGE | INTRAVENOUS | Status: DC | PRN
  Administered 2023-06-01: 10 mg via INTRAVENOUS
  Administered 2023-06-01: 5 mg via INTRAVENOUS

## 2023-06-01 MED ORDER — OXYCODONE HCL 5 MG PO TABS
5.0000 mg | ORAL_TABLET | ORAL | Status: DC | PRN
  Filled 2023-06-01: qty 2

## 2023-06-01 MED ORDER — BUPIVACAINE IN DEXTROSE 0.75-8.25 % IT SOLN
INTRATHECAL | Status: DC | PRN
  Administered 2023-06-01: 1.8 mL via INTRATHECAL

## 2023-06-01 MED ORDER — 0.9 % SODIUM CHLORIDE (POUR BTL) OPTIME
TOPICAL | Status: DC | PRN
  Administered 2023-06-01: 1000 mL

## 2023-06-01 MED ORDER — PROPOFOL 1000 MG/100ML IV EMUL
INTRAVENOUS | Status: AC
  Filled 2023-06-01: qty 100

## 2023-06-01 MED ORDER — GABAPENTIN 100 MG PO CAPS
200.0000 mg | ORAL_CAPSULE | Freq: Every day | ORAL | Status: DC
  Administered 2023-06-01 – 2023-06-02 (×2): 200 mg via ORAL
  Filled 2023-06-01 (×2): qty 2

## 2023-06-01 MED ORDER — CEFAZOLIN SODIUM-DEXTROSE 2-4 GM/100ML-% IV SOLN
2.0000 g | Freq: Four times a day (QID) | INTRAVENOUS | Status: AC
  Administered 2023-06-01 – 2023-06-02 (×2): 2 g via INTRAVENOUS
  Filled 2023-06-01 (×2): qty 100

## 2023-06-01 MED ORDER — ONDANSETRON HCL 4 MG/2ML IJ SOLN
INTRAMUSCULAR | Status: DC | PRN
  Administered 2023-06-01: 4 mg via INTRAVENOUS

## 2023-06-01 MED ORDER — ONDANSETRON HCL 4 MG PO TABS: 4.0000 mg | ORAL_TABLET | Freq: Four times a day (QID) | ORAL | Status: DC | PRN

## 2023-06-01 MED ORDER — TIZANIDINE HCL 4 MG PO TABS
2.0000 mg | ORAL_TABLET | Freq: Three times a day (TID) | ORAL | Status: DC | PRN
  Administered 2023-06-01 – 2023-06-03 (×5): 4 mg via ORAL
  Filled 2023-06-01 (×5): qty 1

## 2023-06-01 MED ORDER — DEXAMETHASONE SODIUM PHOSPHATE 10 MG/ML IJ SOLN
8.0000 mg | Freq: Once | INTRAMUSCULAR | Status: AC
  Administered 2023-06-01: 8 mg via INTRAVENOUS

## 2023-06-01 MED ORDER — OXYCODONE HCL 5 MG PO TABS
10.0000 mg | ORAL_TABLET | ORAL | Status: DC | PRN
  Administered 2023-06-01: 15 mg via ORAL
  Administered 2023-06-02: 10 mg via ORAL
  Administered 2023-06-02 – 2023-06-03 (×7): 15 mg via ORAL
  Filled 2023-06-01 (×3): qty 3
  Filled 2023-06-01: qty 2
  Filled 2023-06-01 (×5): qty 3

## 2023-06-01 MED ORDER — FENTANYL CITRATE PF 50 MCG/ML IJ SOSY
50.0000 ug | PREFILLED_SYRINGE | INTRAMUSCULAR | Status: AC
  Administered 2023-06-01: 50 ug via INTRAVENOUS
  Filled 2023-06-01: qty 2

## 2023-06-01 MED ORDER — DEXAMETHASONE SODIUM PHOSPHATE 10 MG/ML IJ SOLN
INTRAMUSCULAR | Status: AC
  Filled 2023-06-01: qty 1

## 2023-06-01 MED ORDER — MIDAZOLAM HCL 2 MG/2ML IJ SOLN
1.0000 mg | INTRAMUSCULAR | Status: AC
  Administered 2023-06-01: 1 mg via INTRAVENOUS
  Filled 2023-06-01: qty 2

## 2023-06-01 MED ORDER — ASPIRIN 81 MG PO CHEW
81.0000 mg | CHEWABLE_TABLET | Freq: Two times a day (BID) | ORAL | Status: DC
  Administered 2023-06-01 – 2023-06-03 (×4): 81 mg via ORAL
  Filled 2023-06-01 (×4): qty 1

## 2023-06-01 MED ORDER — HYDROMORPHONE HCL 1 MG/ML IJ SOLN
0.5000 mg | INTRAMUSCULAR | Status: DC | PRN
  Administered 2023-06-01 – 2023-06-02 (×3): 1 mg via INTRAVENOUS
  Filled 2023-06-01 (×3): qty 1

## 2023-06-01 MED ORDER — PHENYLEPHRINE 80 MCG/ML (10ML) SYRINGE FOR IV PUSH (FOR BLOOD PRESSURE SUPPORT)
PREFILLED_SYRINGE | INTRAVENOUS | Status: DC | PRN
  Administered 2023-06-01 (×2): 80 ug via INTRAVENOUS
  Administered 2023-06-01: 160 ug via INTRAVENOUS
  Administered 2023-06-01 (×2): 80 ug via INTRAVENOUS

## 2023-06-01 MED ORDER — TRANEXAMIC ACID-NACL 1000-0.7 MG/100ML-% IV SOLN
1000.0000 mg | Freq: Once | INTRAVENOUS | Status: AC
  Administered 2023-06-01: 1000 mg via INTRAVENOUS
  Filled 2023-06-01: qty 100

## 2023-06-01 MED ORDER — ONDANSETRON HCL 4 MG/2ML IJ SOLN: 4.0000 mg | Freq: Four times a day (QID) | INTRAMUSCULAR | Status: DC | PRN

## 2023-06-01 MED ORDER — PROPOFOL 10 MG/ML IV BOLUS
INTRAVENOUS | Status: AC
  Filled 2023-06-01: qty 20

## 2023-06-01 MED ORDER — ORAL CARE MOUTH RINSE
15.0000 mL | Freq: Once | OROMUCOSAL | Status: AC
  Administered 2023-06-01: 15 mL via OROMUCOSAL

## 2023-06-01 MED ORDER — DIPHENHYDRAMINE HCL 12.5 MG/5ML PO ELIX
12.5000 mg | ORAL_SOLUTION | ORAL | Status: DC | PRN
  Administered 2023-06-02: 25 mg via ORAL
  Filled 2023-06-01: qty 10

## 2023-06-01 MED ORDER — SODIUM CHLORIDE 0.9 % IV SOLN: INTRAVENOUS | Status: DC

## 2023-06-01 MED ORDER — SODIUM CHLORIDE (PF) 0.9 % IJ SOLN
INTRAMUSCULAR | Status: AC
  Filled 2023-06-01: qty 30

## 2023-06-01 MED ORDER — IRBESARTAN 150 MG PO TABS
300.0000 mg | ORAL_TABLET | Freq: Every day | ORAL | Status: DC
Start: 2023-06-01 — End: 2023-06-01

## 2023-06-01 MED ORDER — STERILE WATER FOR IRRIGATION IR SOLN
Status: DC | PRN
  Administered 2023-06-01: 2000 mL

## 2023-06-01 MED ORDER — VANCOMYCIN HCL 1 G IV SOLR
INTRAVENOUS | Status: DC | PRN
  Administered 2023-06-01: 1000 mg

## 2023-06-01 MED ORDER — DOCUSATE SODIUM 100 MG PO CAPS
100.0000 mg | ORAL_CAPSULE | Freq: Two times a day (BID) | ORAL | Status: DC
  Administered 2023-06-01 – 2023-06-03 (×4): 100 mg via ORAL
  Filled 2023-06-01 (×4): qty 1

## 2023-06-01 MED ORDER — DEXAMETHASONE SODIUM PHOSPHATE 10 MG/ML IJ SOLN
10.0000 mg | Freq: Once | INTRAMUSCULAR | Status: AC
  Administered 2023-06-02: 10 mg via INTRAVENOUS
  Filled 2023-06-01: qty 1

## 2023-06-01 MED ORDER — EPHEDRINE 5 MG/ML INJ
INTRAVENOUS | Status: AC
  Filled 2023-06-01: qty 5

## 2023-06-01 MED ORDER — CEFAZOLIN SODIUM-DEXTROSE 2-4 GM/100ML-% IV SOLN
2.0000 g | INTRAVENOUS | Status: AC
  Administered 2023-06-01: 2 g via INTRAVENOUS
  Filled 2023-06-01: qty 100

## 2023-06-01 MED ORDER — CHLORHEXIDINE GLUCONATE 0.12 % MT SOLN: 15.0000 mL | Freq: Once | OROMUCOSAL | Status: AC

## 2023-06-01 MED ORDER — PROPOFOL 500 MG/50ML IV EMUL
INTRAVENOUS | Status: DC | PRN
  Administered 2023-06-01: 90 ug/kg/min via INTRAVENOUS

## 2023-06-01 SURGICAL SUPPLY — 72 items
ADH SKN CLS APL DERMABOND .7 (GAUZE/BANDAGES/DRESSINGS) ×1
ATTUNE MED ANAT PAT 38 KNEE (Knees) IMPLANT
AUG FEM DIST CMT ATTUNE 5 12 (Joint) ×2 IMPLANT
AUG FEM SZ5 4 REV POST STRL LF (Miscellaneous) ×1 IMPLANT
AUGMENT FEM DIST CMT ATT 5 12 (Joint) IMPLANT
AUGMENT POST FEM SZ5 4 KNEE (Miscellaneous) IMPLANT
BAG COUNTER SPONGE SURGICOUNT (BAG) IMPLANT
BAG SPEC THK2 15X12 ZIP CLS (MISCELLANEOUS) ×1
BAG SPNG CNTER NS LX DISP (BAG)
BAG ZIPLOCK 12X15 (MISCELLANEOUS) ×1 IMPLANT
BASE TIB KNEE REV RP ATUNE SZ6 (Knees) IMPLANT
BLADE SAW SGTL 11.0X1.19X90.0M (BLADE) ×1 IMPLANT
BLADE SAW SGTL 13.0X1.19X90.0M (BLADE) ×1 IMPLANT
BLADE SAW SGTL 81X20 HD (BLADE) ×1 IMPLANT
BNDG CMPR 6 X 5 YARDS HK CLSR (GAUZE/BANDAGES/DRESSINGS) ×1
BNDG ELASTIC 6INX 5YD STR LF (GAUZE/BANDAGES/DRESSINGS) ×1 IMPLANT
BOWL SMART MIX CTS (DISPOSABLE) ×1 IMPLANT
BRUSH FEMORAL CANAL (MISCELLANEOUS) IMPLANT
BSPLAT TIB 6 CMNT REV ROT PLAT (Knees) ×1 IMPLANT
CEMENT HV SMART SET (Cement) ×2 IMPLANT
CEMENT RESTRICTOR DEPUY SZ 4 (Cement) IMPLANT
COMP FEM ATTUNE CRS CEM RT SZ5 (Femur) ×1 IMPLANT
COMPONENT FEM ATN CR CEM RTSZ5 (Femur) IMPLANT
CONE REV ATTUNE KNEE MED (Knees) IMPLANT
COVER SURGICAL LIGHT HANDLE (MISCELLANEOUS) ×1 IMPLANT
CUFF TOURN SGL QUICK 34 (TOURNIQUET CUFF) ×1
CUFF TRNQT CYL 34X4.125X (TOURNIQUET CUFF) ×1 IMPLANT
DERMABOND ADVANCED .7 DNX12 (GAUZE/BANDAGES/DRESSINGS) ×1 IMPLANT
DRAPE INCISE IOBAN 66X45 STRL (DRAPES) ×3 IMPLANT
DRAPE U-SHAPE 47X51 STRL (DRAPES) ×2 IMPLANT
DRESSING AQUACEL AG SP 3.5X10 (GAUZE/BANDAGES/DRESSINGS) IMPLANT
DRSG AQUACEL AG ADV 3.5X14 (GAUZE/BANDAGES/DRESSINGS) IMPLANT
DRSG AQUACEL AG SP 3.5X10 (GAUZE/BANDAGES/DRESSINGS)
DURAPREP 26ML APPLICATOR (WOUND CARE) ×2 IMPLANT
ELECT REM PT RETURN 15FT ADLT (MISCELLANEOUS) ×1 IMPLANT
GLOVE BIO SURGEON STRL SZ 6 (GLOVE) ×1 IMPLANT
GLOVE BIOGEL PI IND STRL 7.5 (GLOVE) ×3 IMPLANT
GLOVE BIOGEL PI IND STRL 8.5 (GLOVE) ×1 IMPLANT
GLOVE ECLIPSE 8.0 STRL XLNG CF (GLOVE) ×2 IMPLANT
GLOVE INDICATOR 6.5 STRL GRN (GLOVE) ×2 IMPLANT
GOWN STRL REUS W/ TWL LRG LVL3 (GOWN DISPOSABLE) ×3 IMPLANT
GOWN STRL REUS W/TWL LRG LVL3 (GOWN DISPOSABLE) ×3
HOLDER FOLEY CATH W/STRAP (MISCELLANEOUS) ×1 IMPLANT
INSERT TIB CRS ATTUNE SZ5 12 (Insert) IMPLANT
JET LAVAGE IRRISEPT WOUND (IRRIGATION / IRRIGATOR) ×1
KIT TURNOVER KIT A (KITS) IMPLANT
LAVAGE JET IRRISEPT WOUND (IRRIGATION / IRRIGATOR) ×1 IMPLANT
MANIFOLD NEPTUNE II (INSTRUMENTS) ×1 IMPLANT
NDL SAFETY ECLIP 18X1.5 (MISCELLANEOUS) ×1 IMPLANT
NS IRRIG 1000ML POUR BTL (IV SOLUTION) ×1 IMPLANT
PACK TOTAL KNEE CUSTOM (KITS) ×1 IMPLANT
PIN FIX SIGMA LCS THRD HI (PIN) IMPLANT
PROTECTOR NERVE ULNAR (MISCELLANEOUS) ×1 IMPLANT
SET PAD KNEE POSITIONER (MISCELLANEOUS) ×1 IMPLANT
SLEEVE TIB ATTUNE FP 37 (Knees) IMPLANT
SOLUTION PRONTOSAN WOUND 350ML (IRRIGATION / IRRIGATOR) IMPLANT
STAPLER VISISTAT 35W (STAPLE) IMPLANT
STEM CEMT ATTUNE 14X80 (Knees) IMPLANT
STEM CMT REV 14X130 (Knees) IMPLANT
SUT MNCRL AB 3-0 PS2 18 (SUTURE) ×1 IMPLANT
SUT STRATAFIX PDS+ 0 24IN (SUTURE) ×1 IMPLANT
SUT VIC AB 1 CT1 36 (SUTURE) ×3 IMPLANT
SUT VIC AB 2-0 CT1 27 (SUTURE) ×3
SUT VIC AB 2-0 CT1 TAPERPNT 27 (SUTURE) ×3 IMPLANT
SWAB COLLECTION DEVICE MRSA (MISCELLANEOUS) IMPLANT
SWAB CULTURE ESWAB REG 1ML (MISCELLANEOUS) IMPLANT
SYR 3ML LL SCALE MARK (SYRINGE) ×1 IMPLANT
TOWER CARTRIDGE SMART MIX (DISPOSABLE) IMPLANT
TRAY FOLEY MTR SLVR 16FR STAT (SET/KITS/TRAYS/PACK) ×1 IMPLANT
TUBE SUCTION HIGH CAP CLEAR NV (SUCTIONS) ×1 IMPLANT
WATER STERILE IRR 1000ML POUR (IV SOLUTION) ×2 IMPLANT
WRAP KNEE MAXI GEL POST OP (GAUZE/BANDAGES/DRESSINGS) ×1 IMPLANT

## 2023-06-01 NOTE — H&P (Signed)
TOTAL KNEE REIMPLANTATION ADMISSION H&P  Patient is being admitted for right revision total knee arthroplasty.   Therapy Plans: outpatient therapy at [facility] Disposition: Home with wife Planned DVT Prophylaxis: aspirin 81mg  BID DME needed: none PCP: Dr. Okey Dupre, clearance received TXA: IV Allergies: NKDA Anesthesia Concerns: none BMI: 34.1 Last HgbA1c: Not diabetic   Other: - 04/22/23 -- ESR/CRP were normalized - medial knee pain -- saphenous neuritis - Has been climbing ladders and very active with the spacer - oxycodone, tizanidine, tylenol, celebrex - No hx of VTE or cancer   Subjective:  Chief Complaint:right knee pain.  HPI: [name] [name], 72 y.o. male. He has a history of right total knee by Dr.Collins on 10/25/22 and subsequent I&D w/ poly exchange on 11/15/22. He states he was on IV antibiotics for 6 weeks and on 4/9 infectious disease removed his PICC line. He had been on Cefadroxil 500mg  bid, but developed increased redness in the knee about a week ago. He had elevated laboratory studies of ESR 29 and CRP 59.   He underwent resection with placement of antibiotic spacer on 02/21/23. H e had 6 weeks IV antibiotics. Inflammatory markers improved to normal.   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   Sepsis due to methicillin resistant Staphylococcus aureus (MRSA) (HCC) 11/18/2022   Penicillin allergy 11/18/2022   Infection of right knee (HCC) 11/14/2022   Chronic sinusitis 09/02/2022   Arthritis of right knee 09/02/2022   Lower respiratory infection 08/08/2022   Acute cough 02/01/2022   Encounter for general adult medical examination with abnormal findings 07/09/2021   BPH (benign prostatic hyperplasia) 07/09/2021   MDD (major depressive disorder), recurrent, in full remission (HCC) 07/09/2021   Essential hypertension 10/20/2008   Past Medical History:  Diagnosis Date   Arthritis    Bronchitis     Depression    GERD (gastroesophageal reflux disease)    Hypertension    Skin cancer     Past Surgical History:  Procedure Laterality Date   CHOLECYSTECTOMY N/A 02/10/2021   Procedure: LAPAROSCOPIC CHOLECYSTECTOMY;  Surgeon: Harriette Bouillon, MD;  Location: [facility] OR;  Service: General;  Laterality: N/A;   EXCISIONAL TOTAL KNEE ARTHROPLASTY WITH ANTIBIOTIC SPACERS Right 02/21/2023   Procedure: EXCISIONAL TOTAL KNEE ARTHROPLASTY WITH ANTIBIOTIC SPACERS;  Surgeon: Durene Romans, MD;  Location: [facility] ORS;  Service: Orthopedics;  Laterality: Right;   HERNIA REPAIR     I & D KNEE WITH POLY EXCHANGE Right 11/15/2022   Procedure: IRRIGATION AND DEBRIDEMENT KNEE WITH  POLY EXCHANGE;  Surgeon: Eugenia Mcalpine, MD;  Location: [facility] ORS;  Service: Orthopedics;  Laterality: Right;  adductor canal, no antibiotics prior to culture add on room to follow other case 61   Knee Surgery Right 10/25/2022   ROTATOR CUFF REPAIR     SKIN CANCER EXCISION      No current facility-administered medications for this encounter.   Current Outpatient Medications  Medication Sig Dispense Refill Last Dose   calcium carbonate (TUMS - DOSED IN MG ELEMENTAL CALCIUM) 500 MG chewable tablet Chew 2 tablets by mouth 2 (two) times daily as needed for indigestion or heartburn.      diclofenac (VOLTAREN) 75 MG EC tablet Take 75 mg by mouth 2 (two) times daily.      Ketotifen Fumarate (ALLERGY EYE DROPS OP) Place 1 drop into both eyes daily as needed (red/itchy eyes).      Magnesium 500 MG TABS Take 500 mg  by mouth every morning.      polyethylene glycol (MIRALAX / GLYCOLAX) 17 g packet Take 17 g by mouth 2 (two) times daily. (Patient taking differently: Take 17 g by mouth daily as needed for mild constipation or moderate constipation.) 14 each 0    pregabalin (LYRICA) 75 MG capsule Take 75 mg by mouth 2 (two) times daily.      saccharomyces boulardii (FLORASTOR) 250 MG capsule Take 500 mg by mouth daily.      sertraline (ZOLOFT) 100 MG  tablet TAKE 1 TABLET BY MOUTH EVERY DAY 90 tablet 3    sodium chloride (OCEAN) 0.65 % SOLN nasal spray Place 1 spray into both nostrils daily as needed for congestion.      tamsulosin (FLOMAX) 0.4 MG CAPS capsule Take 0.4 mg by mouth at bedtime.  0    telmisartan (MICARDIS) 80 MG tablet TAKE 1 TABLET BY MOUTH EVERY DAY 90 tablet 3    gabapentin (NEURONTIN) 100 MG capsule Take 200 mg by mouth at bedtime.      Allergies  Allergen Reactions   Avapro [Irbesartan] Cough   Lexapro [Escitalopram Oxalate] Other (See Comments)    Anxiety, insomnia   Chlorhexidine Rash    Reaction to PICC line dressing with Chlorhexidine gel    Social History   Tobacco Use   Smoking status: Never   Smokeless tobacco: Never  Substance Use Topics   Alcohol use: Yes    Comment: socially    Family History  Problem Relation Age of Onset   Emphysema Mother        heart failure indusce emphysema   Heart disease Father    Heart attack Father    Heart disease Sister 54       [id number]      Review of Systems  Constitutional:  Negative for chills and fever.  Respiratory:  Negative for cough and shortness of breath.   Cardiovascular:  Negative for chest pain.  Gastrointestinal:  Negative for nausea and vomiting.  Musculoskeletal:  Positive for arthralgias.      Objective:  Physical Exam Well nourished and well developed. General: Alert and oriented x3, cooperative and pleasant, no acute distress. Head: normocephalic, atraumatic, neck supple. Eyes: EOMI.  Musculoskeletal: Right knee exam: His surgical incision is healed No palpable effusion No erythema or warmth Tenderness with some recognized hypersensitivity medially Flexion is limited due to tightness and grinding sensation related to the cement spacer to less than 90 degrees  Calves soft and nontender. Motor function intact in LE. Strength 5/5 LE bilaterally. Neuro: Distal pulses 2+. Sensation to light touch intact in LE.  Vital signs in last  24 hours:    Labs:  Estimated body mass index is 33.97 kg/m as calculated from the following:   Height as of 05/23/23: 5\' 9"  (1.753 m).   Weight as of 05/23/23: 104.3 kg.  Imaging Review No interval radiographs.     Assessment/Plan:  Status post right total knee arthroplasty resection   The patient history, physical examination, clinical judgment of the provider and imaging studies are consistent with end stage degenerative joint disease of the right knee(s), previous total knee arthroplasty. Revision total knee arthroplasty is deemed medically necessary. The treatment options including medical management, injection therapy, arthroscopy and revision arthroplasty were discussed at length. The risks and benefits of revision total knee arthroplasty were presented and reviewed. The risks due to aseptic loosening, infection, stiffness, patella tracking problems, thromboembolic complications and other imponderables were discussed. The patient acknowledged  the explanation, agreed to proceed with the plan and consent was signed. Patient is being admitted for inpatient treatment for surgery, pain control, PT, OT, prophylactic antibiotics, VTE prophylaxis, progressive ambulation and ADL's and discharge planning.The patient is planning to be discharged  home.  Rosalene Billings, PA-C Orthopedic Surgery [facility] [place] [phone]

## 2023-06-01 NOTE — Brief Op Note (Signed)
06/01/2023  11:06 AM  PATIENT:  [name] [name]  72 y.o. male  PRE-OPERATIVE DIAGNOSIS:  Status post right total knee resection with placement of antibiotic spacer  POST-OPERATIVE DIAGNOSIS:  Status post right total knee resection with placement of antibiotic spacer  PROCEDURE:  Procedure(s): REIMPLANTATION/REVISION OF TOTAL KNEE (Right)  SURGEON:  Surgeons and Role:    Durene Romans, MD - Primary  PHYSICIAN ASSISTANT: Rosalene Billings, PA-C  ANESTHESIA:   regional and spinal  EBL:  <500 cc  BLOOD ADMINISTERED:none  DRAINS: none   LOCAL MEDICATIONS USED:  MARCAINE     SPECIMEN:  No Specimen  DISPOSITION OF SPECIMEN:  N/A  COUNTS:  YES  TOURNIQUET:  90 min at 225 mmHg  DICTATION: .Other Dictation: Dictation Number [id number]  PLAN OF CARE: Admit to inpatient   PATIENT DISPOSITION:  PACU - hemodynamically stable.   Delay start of Pharmacological VTE agent (>24hrs) due to surgical blood loss or risk of bleeding: no

## 2023-06-01 NOTE — Anesthesia Postprocedure Evaluation (Signed)
Anesthesia Post Note  Patient: [name] [name]  Procedure(s) Performed: REIMPLANTATION/REVISION OF TOTAL KNEE (Right: Knee)     Patient location during evaluation: PACU Anesthesia Type: Spinal Level of consciousness: oriented and awake and alert Pain management: pain level controlled Vital Signs Assessment: post-procedure vital signs reviewed and stable Respiratory status: spontaneous breathing, respiratory function stable and patient connected to nasal cannula oxygen Cardiovascular status: blood pressure returned to baseline and stable Postop Assessment: no headache, no backache and no apparent nausea or vomiting Anesthetic complications: no   No notable events documented.  Last Vitals:  Vitals:   06/01/23 1652 06/01/23 1829  BP: 115/67 114/72  Pulse: 63 85  Resp: 17 17  Temp: 36.6 C 37.3 C  SpO2: 97% 95%    Last Pain:  Vitals:   06/01/23 2010  TempSrc:   PainSc: 8                  Lewie Loron

## 2023-06-01 NOTE — Anesthesia Procedure Notes (Signed)
Spinal  Patient location during procedure: OR Start time: 06/01/2023 11:08 AM End time: 06/01/2023 11:12 AM Reason for block: surgical anesthesia Staffing Performed: resident/CRNA  Resident/CRNA: Nelle Don, CRNA Performed by: Nelle Don, CRNA Authorized by: Lewie Loron, MD   Preanesthetic Checklist Completed: patient identified, IV checked, site marked, risks and benefits discussed, surgical consent, monitors and equipment checked, pre-op evaluation and timeout performed Spinal Block Patient position: sitting Prep: DuraPrep Patient monitoring: heart rate, cardiac monitor, continuous pulse ox and blood pressure Approach: midline Location: L3-4 Injection technique: single-shot Needle Needle type: Pencan  Needle gauge: 24 G Needle length: 10 cm Additional Notes Expiration date of kit checked. Clear CSF flow prior to injection. Dr. Renold Don present

## 2023-06-01 NOTE — Anesthesia Preprocedure Evaluation (Addendum)
Anesthesia Evaluation  Patient identified by MRN, date of birth, ID band Patient awake    Reviewed: Allergy & Precautions, H&P , NPO status , Patient's Chart, lab work & pertinent test results  Airway Mallampati: II   Neck ROM: full    Dental  (+) Dental Advisory Given, Teeth Intact   Pulmonary neg pulmonary ROS   breath sounds clear to auscultation       Cardiovascular hypertension, Pt. on medications  Rhythm:regular Rate:Normal     Neuro/Psych  PSYCHIATRIC DISORDERS  Depression       GI/Hepatic ,GERD  Controlled,,  Endo/Other    Renal/GU      Musculoskeletal  (+) Arthritis ,    Abdominal  (+) + obese  Peds  Hematology   Anesthesia Other Findings   Reproductive/Obstetrics                             Anesthesia Physical Anesthesia Plan  ASA: 2  Anesthesia Plan: Spinal and MAC   Post-op Pain Management: Regional block* and Tylenol PO (pre-op)*   Induction: Intravenous  PONV Risk Score and Plan: 2 and Ondansetron, Propofol infusion, Treatment may vary due to age or medical condition and TIVA  Airway Management Planned: Simple Face Mask  Additional Equipment:   Intra-op Plan:   Post-operative Plan:   Informed Consent:   Plan Discussed with:   Anesthesia Plan Comments:        Anesthesia Quick Evaluation

## 2023-06-01 NOTE — Anesthesia Procedure Notes (Signed)
Procedure Name: MAC Date/Time: 06/01/2023 11:08 AM  Performed by: Nelle Don, CRNAPre-anesthesia Checklist: Patient identified, Emergency Drugs available, Suction available and Patient being monitored Oxygen Delivery Method: Simple face mask

## 2023-06-01 NOTE — Interval H&P Note (Signed)
History and Physical Interval Note:  06/01/2023 9:39 AM  [name] [name]  has presented today for surgery, with the diagnosis of Status post right total knee rescection.  The various methods of treatment have been discussed with the patient and family. After consideration of risks, benefits and other options for treatment, the patient has consented to  Procedure(s): REIMPLANTATION/REVISION OF TOTAL KNEE (Right) as a surgical intervention.  The patient's history has been reviewed, patient examined, no change in status, stable for surgery.  I have reviewed the patient's chart and labs.  Questions were answered to the patient's satisfaction.     [name]

## 2023-06-01 NOTE — Discharge Instructions (Signed)

## 2023-06-01 NOTE — Op Note (Signed)
NAME: [name] [name], [name] [name]. MEDICAL RECORD NO: [id number] ACCOUNT NO: [id number] DATE OF BIRTH: [birth year] FACILITY: [facility] LOCATION: [facility] PHYSICIAN: Madlyn Frankel. 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  PROCEDURE:  Revision/reimplantation right total knee arthroplasty.  COMPONENTS USED:  DePuy Attune revision knee system with [name] size 38 patellar button.  The size 5 right revision femoral component with 12 mm distal medial and lateral augments, 4 mm posterior lateral augment, [name] medium press-fit cone with [name] 14 x 130 mm  cemented stem.  On the tibia side, we used [name] size 6 tibial revision tray with [name] 37 mm press-fit sleeve and [name] 14 x 80 cemented stem.  I used [name] 12 mm revision polyethylene insert.  SURGEON:  Madlyn Frankel. Charlann Boxer, MD  ASSISTANT:  Rosalene Billings, PA-C.  Note that [name] [name] was present for the entirety of the case from preoperative positioning, perioperative management of the operative extremity, general facilitation of the case and primary wound closure.  ANESTHESIA:  Regional plus spinal block.  BLOOD LOSS:  Less than 500 mL.  TOURNIQUET:  Up for 90 minutes at 225 mmHg.  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  He is now status post resection of his infected right knee replacement with placement of antibiotic articulating spacer.   He was treated with IV antibiotics.  We observed his laboratory studies returned to near normal.  He was scheduled for reimplantation.  The risks of recurrent infection and need for future surgeries were discussed and reviewed the postoperative course  and the effort required to maximize his  motion and strength were discussed and stressed.  We reviewed standard risks of DVT as well.  Consent was obtained for the benefit of pain relief.  DESCRIPTION OF PROCEDURE:  The patient was brought to the operative theater.  Once adequate anesthesia, preoperative antibiotics, Ancef administered, he was positioned supine with [name] right thigh tourniquet placed.  The right lower extremity was then  prepped and draped in sterile fashion.  [name] timeout was performed identifying the patient, planned procedure, and extremity.  [name] timeout was performed identifying the patient, planned procedure, and extremity.  The leg was exsanguinated and tourniquet  elevated to 225 mmHg.  His old incision was incised and extended slightly proximal and distal.  Soft tissue exposure was carried out.  [name] median arthrotomy was made, encountering [name] blood-tinged synovial fluid, likely related to the findings of the broken  femoral mold.  The knee was noted to come out at least 5 degrees of hyperextension and flexed only to about 40-50 degrees.  This represented [name] challenge from exposure standpoint.  I recreated the soft tissue planes and did [name] significant scar debridement,  synovectomy within the medial and lateral gutters and then suprapatellar region.  I carefully dissected and the extensor mechanism off the tibial tubercle without complication and the banana peel technique to allow for subluxation of the patella.  The  femoral component was noted to be broken vertically.  I removed the femoral component in pieces and then the tibial component articulating antibiotic spacer.  The dowels were removed.  We then reamed the femur and the tibia.  We irrigated the canals.  At  this point,  I had started preparing the tibia.  I just freshen up the cut even though there was going to be no contact of the component.  Because I was going to use [name] press-fit sleeve just remove old nonviable tissue and bone.  I sized the tibia to be [name]  size 6.  I  broached up to [name] 37, leaving it proud off the proximal tibia and placed [name] trial tibial tray on to this.  On the femoral side we sized initially to be [name] size 6.  I made cuts for the size 6 only removing [name] very little amount off the distal  femur, all the anterior, posterior and chamfer cuts were made without difficulty or complication.  I did feel that I needed 4 mm augments posteriorly, medially and laterally.  Once this was done, we did [name] trial reduction.  He was noted to be very tight  in flexion at this point, but still with extension and there was hyperextension.  For that reason, I played with augments first adding 8 mm augments and ultimately 12 to balance it out.  As we worked through this soft tissue releasing as well as these  components I found that with the 12 mm insert, I found the knee came to extension and the flexion balanced appropriately.  I did revise the patella.  I had performed [name] synovectomy and scar debridement around the patella and freshened up this cut using [name]  38 patellar button.  Once all the trials were confirmed we removed all trial components.  I placed cement restrictors into the distal femur and proximal tibia as measured out.  The final components were opened and configured on the back table.   Ultimately cement was mixed.  I did repair the distal femur for [name] press-fit cone.  The medium cone was impacted.  Once this was done and the cement was prepared, which was three batches of cement with 1 gram of vancomycin we utilized [name] hybrid technique  of cementing the tibia, stem in place and then impacting the component to the position where the broach had sat.  Once this was done, I cemented within the distal aspect of the femoral preparation beyond the cone, and then around the distal aspect of the  femur.  The knee was brought to extension with [name] 12 mm insert to allow the cement to fully cure.  Once the cement fully cured, excessive cement was removed.  We placed [name] 12 mm final  revision insert to match the 5 femur.  The knee was irrigated  throughout the case.  We used [name] total of 3 liters of normal saline solution in addition to Prontosan antimicrobial solution.  The tourniquet was let down after 90 minutes.  There was no significant hemostasis that was required.  Unfortunately, there was  just scar debridement oozing.  Reapproximated the extensor mechanism using [name] combination of multiple #1 Vicryl sutures and two #1 Stratafix sutures.  The remainder of the wound was closed in layers with 2-0 Vicryl and [name] running Monocryl stitch.  The knee  was clean, dry and dressed sterilely using surgical glue and Aquacel dressing.  The patient's knee was wrapped in Ace wrap.  He was brought to the recovery room in stable condition, tolerating the procedure well.  Findings reviewed with his wife.  We  will at this point let him be weightbearing as tolerated through his knee.  I will see him back in the office in 2 weeks.  I will  likely put him on oral antibiotics for 2 weeks for prophylaxis given his infection issues.   PUS D: 06/01/2023 1:55:28 pm T: 06/01/2023 2:20:00 pm  JOB: [id number]/ [id number]

## 2023-06-01 NOTE — Plan of Care (Signed)

## 2023-06-01 NOTE — Transfer of Care (Signed)
Immediate Anesthesia Transfer of Care Note  Patient: [name]  Procedure(s) Performed: REIMPLANTATION/REVISION OF TOTAL KNEE (Right: Knee)  Patient Location: PACU  Anesthesia Type:Spinal  Level of Consciousness: awake, alert , and oriented  Airway & Oxygen Therapy: Patient Spontanous Breathing and Patient connected to face mask oxygen  Post-op Assessment: Report given to RN and Post -op Vital signs reviewed and stable  Post vital signs: Reviewed and stable  Last Vitals:  Vitals Value Taken Time  BP 96/62 06/01/23 1402  Temp    Pulse 74 06/01/23 1404  Resp 20 06/01/23 1404  SpO2 99 % 06/01/23 1404  Vitals shown include unfiled device data.  Last Pain:  Vitals:   06/01/23 0922  TempSrc: Oral  PainSc: 4          Complications: No notable events documented.

## 2023-06-02 ENCOUNTER — Other Ambulatory Visit: Payer: Self-pay

## 2023-06-02 LAB — CBC
HCT: 34.8 % — ABNORMAL LOW (ref 39.0–52.0)
Hemoglobin: 11.2 g/dL — ABNORMAL LOW (ref 13.0–17.0)
MCH: 29.8 pg (ref 26.0–34.0)
MCHC: 32.2 g/dL (ref 30.0–36.0)
MCV: 92.6 fL (ref 80.0–100.0)
Platelets: 164 10*3/uL (ref 150–400)
RBC: 3.76 MIL/uL — ABNORMAL LOW (ref 4.22–5.81)
RDW: 16.9 % — ABNORMAL HIGH (ref 11.5–15.5)
WBC: 9.5 10*3/uL (ref 4.0–10.5)
nRBC: 0 % (ref 0.0–0.2)

## 2023-06-02 LAB — BASIC METABOLIC PANEL
Anion gap: 6 (ref 5–15)
BUN: 17 mg/dL (ref 8–23)
CO2: 25 mmol/L (ref 22–32)
Calcium: 8.1 mg/dL — ABNORMAL LOW (ref 8.9–10.3)
Chloride: 102 mmol/L (ref 98–111)
Creatinine, Ser: 0.81 mg/dL (ref 0.61–1.24)
GFR, Estimated: >60 mL/min (ref >60)
Glucose, Bld: 170 mg/dL — ABNORMAL HIGH (ref 70–99)
Potassium: 4 mmol/L (ref 3.5–5.1)
Sodium: 133 mmol/L — ABNORMAL LOW (ref 135–145)

## 2023-06-02 MED ORDER — KETOROLAC TROMETHAMINE 15 MG/ML IJ SOLN: 7.5000 mg | Freq: Four times a day (QID) | INTRAMUSCULAR | Status: DC

## 2023-06-02 MED ORDER — KETOROLAC TROMETHAMINE 15 MG/ML IJ SOLN
7.5000 mg | Freq: Four times a day (QID) | INTRAMUSCULAR | Status: AC
  Administered 2023-06-02 – 2023-06-03 (×4): 7.5 mg via INTRAVENOUS
  Filled 2023-06-02 (×3): qty 1

## 2023-06-02 NOTE — Evaluation (Addendum)
Physical Therapy Evaluation Patient Details Name: [name] [name] MRN: [id number] DOB: 06/22/1951 Today's Date: 06/02/2023  History of Present Illness  [name], 72 y.o. 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 PMH: HTN, GERD, skin cancer, depression  Clinical Impression  Pt is s/p R TKA revision 06/01/23 resulting in the deficits listed below (see PT Problem List). Pt from home with spouse, ind at baseline, using KI on R LE, denies falls. Pt with significant R quad weakness noted, weak isometric quad activation, unable to perform SLR or LAQ; utilized KI for safety. Pt needing mod A to bring RLE to EOB and to lift back onto bed. Min A to power up to standing, strong BUE assist. Pt with limited ambulation using RW, decreased RLE stance time and weight-bearing, KI on and no knee buckling noted. Pt with significant pain reports in R knee limiting mobility and exercises. Pt will benefit from acute skilled PT to increase their independence and safety with mobility to allow discharge.          If plan is discharge home, recommend the following: A little help with walking and/or transfers;A little help with bathing/dressing/bathroom;Assistance with cooking/housework;Assist for transportation;Help with stairs or ramp for entrance   Can travel by private vehicle        Equipment Recommendations None recommended by PT  Recommendations for Other Services       Functional Status Assessment Patient has had a recent decline in their functional status and demonstrates the ability to make significant improvements in function in a reasonable and predictable amount of time.     Precautions / Restrictions Precautions Precautions: Fall Required Braces or Orthoses: Knee Immobilizer - Right Restrictions Weight Bearing Restrictions:  No RLE Weight Bearing: Weight bearing as tolerated      Mobility  Bed Mobility Overal bed mobility: Needs Assistance Bed Mobility: Supine to Sit, Sit to Supine     Supine to sit: Mod assist Sit to supine: Mod assist   General bed mobility comments: mod A for RLE assist mobilizing to EOB and to lift back into bed, pt self assisting as able    Transfers Overall transfer level: Needs assistance Equipment used: Rolling walker (2 wheels) Transfers: Sit to/from Stand Sit to Stand: Min assist           General transfer comment: min A to power up from EOB    Ambulation/Gait Ambulation/Gait assistance: Min assist Gait Distance (Feet): 20 Feet Assistive device: Rolling walker (2 wheels) Gait Pattern/deviations: Step-to pattern, Decreased stride length, Decreased weight shift to right Gait velocity: decreased     General Gait Details: step-to gait pattern with decreased RLE stance time and WB, strong UE assist on RW, no overt LOB, KI utilized without knee buckling noted  Stairs            Wheelchair Mobility     Tilt Bed    Modified Rankin (Stroke Patients Only)       Balance Overall balance assessment: Needs assistance Sitting-balance support: Feet supported Sitting balance-Leahy Scale: Good     Standing balance support: Reliant on assistive device for balance, During functional activity, Bilateral upper extremity supported Standing balance-Leahy Scale: Poor  Pertinent Vitals/Pain Pain Assessment Pain Assessment: 0-10 Pain Score: 8  Pain Location: R knee Pain Descriptors / Indicators: Grimacing, Guarding Pain Intervention(s): Limited activity within patient's tolerance, Monitored during session, Premedicated before session, Repositioned, Ice applied    Home Living Family/patient expects to be discharged to:: Private residence Living Arrangements: Spouse/significant other Available Help at Discharge:  Family;Available 24 hours/day Type of Home: House Home Access: Stairs to enter Entrance Stairs-Rails: Right;Left;Can reach both Entrance Stairs-Number of Steps: 2   Home Layout: One level Home Equipment: Agricultural consultant (2 wheels);Cane - single point;Grab bars - tub/shower;Grab bars - toilet      Prior Function Prior Level of Function : Independent/Modified Independent             Mobility Comments: pt reports ind with KI on ADLs Comments: pt reports ind     Extremity/Trunk Assessment   Upper Extremity Assessment Upper Extremity Assessment: Overall WFL for tasks assessed    Lower Extremity Assessment Lower Extremity Assessment: RLE deficits/detail RLE Deficits / Details: ankle and hip AROM WFL, knee quad set weak, unable to perform SLR or LAQ RLE Sensation: WNL RLE Coordination: WNL    Cervical / Trunk Assessment Cervical / Trunk Assessment: Normal  Communication   Communication Communication: No apparent difficulties  Cognition Arousal: Alert Behavior During Therapy: WFL for tasks assessed/performed Overall Cognitive Status: Within Functional Limits for tasks assessed                                          General Comments      Exercises Total Joint Exercises Ankle Circles/Pumps: Seated, AROM, Both, 10 reps Quad Sets: Seated, AROM, Strengthening, Both, 10 reps Straight Leg Raises:  (unable RLE)   Assessment/Plan    PT Assessment Patient needs continued PT services  PT Problem List Decreased strength;Decreased range of motion;Decreased activity tolerance;Decreased balance;Decreased mobility;Pain;Decreased skin integrity       PT Treatment Interventions DME instruction;Stair training;Gait training;Functional mobility training;Therapeutic activities;Therapeutic exercise;Balance training;Patient/family education;Modalities;Manual techniques    PT Goals (Current goals can be found in the Care Plan section)  Acute Rehab PT Goals Patient  Stated Goal: regain strength and independence PT Goal Formulation: With patient Time For Goal Achievement: 06/16/23 Potential to Achieve Goals: Good    Frequency 7X/week     Co-evaluation               AM-PAC PT "6 Clicks" Mobility  Outcome Measure Help needed turning from your back to your side while in a flat bed without using bedrails?: A Little Help needed moving from lying on your back to sitting on the side of a flat bed without using bedrails?: A Little Help needed moving to and from a bed to a chair (including a wheelchair)?: A Little Help needed standing up from a chair using your arms (e.g., wheelchair or bedside chair)?: A Little Help needed to walk in hospital room?: A Little Help needed climbing 3-5 steps with a railing? : A Lot 6 Click Score: 17    End of Session Equipment Utilized During Treatment: Gait belt Activity Tolerance: Patient tolerated treatment well;Patient limited by pain Patient left: in chair;with call bell/phone within reach;with chair alarm set Nurse Communication: Mobility status PT Visit Diagnosis: Muscle weakness (generalized) (M62.81);Pain;Difficulty in walking, not elsewhere classified (R26.2) Pain - Right/Left: Right Pain - part of body: Knee    Time: 5284-1324 PT Time Calculation (min) (ACUTE  ONLY): 35 min   Charges:   PT Evaluation $PT Eval Moderate Complexity: 1 Mod PT Treatments $Therapeutic Activity: 8-22 mins PT General Charges $$ ACUTE PT VISIT: 1 Visit         Tori Latisia Hilaire PT, DPT 06/02/23, 12:32 PM

## 2023-06-02 NOTE — Progress Notes (Signed)
Orthopedic Tech Progress Note Patient Details:  [name] [name] [birth year] [id number]  Ortho Devices Type of Ortho Device: Knee Immobilizer Ortho Device/Splint Location: Right knee Ortho Device/Splint Interventions: Application   Post Interventions Patient Tolerated: Well  [name] [name] 06/02/2023, 10:58 AM

## 2023-06-02 NOTE — Progress Notes (Signed)
Physical Therapy Treatment Patient Details Name: [name] [name] MRN: [id number] DOB: June 28, 1951 Today's Date: 06/02/2023   History of Present Illness [name] [name], 72 y.o. 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 PMH: HTN, GERD, skin cancer, depression    PT Comments  Pt with improvement in quad activation this session, though still unable to perform SLR. Pt amb increased distances with step to gait pattern, 1 standing rest break due to BUE fatigue from heavy use on RW, no overt LOB or knee buckling noted with KI on. Pt reports pain up to 5/10, verbalizes fear of it increasing high after walk; ensured ice packs full and in place and educated pt to notify nursing if needing more ice or pain increases and pt verbalizes understanding. Pt's spouse present at end of session, reports pt has to ascend 1 step then 3 steps with bil handrails; pt unable to tolerate stair training this session and is a high fall risk. Plan to progress stair training next session as able.    If plan is discharge home, recommend the following: A little help with walking and/or transfers;A little help with bathing/dressing/bathroom;Assistance with cooking/housework;Assist for transportation;Help with stairs or ramp for entrance   Can travel by private vehicle        Equipment Recommendations  None recommended by PT    Recommendations for Other Services       Precautions / Restrictions Precautions Precautions: Fall Required Braces or Orthoses: Knee Immobilizer - Right Restrictions Weight Bearing Restrictions: No RLE Weight Bearing: Weight bearing as tolerated     Mobility  Bed Mobility Overal bed mobility: Needs Assistance Bed Mobility: Supine to Sit, Sit to Supine     Supine to sit: Mod assist Sit to supine: Mod assist   General bed  mobility comments: in recliner upon arrival    Transfers Overall transfer level: Needs assistance Equipment used: Rolling walker (2 wheels) Transfers: Sit to/from Stand Sit to Stand: Contact guard assist           General transfer comment: cues for hand placement, increased effort from recliner    Ambulation/Gait Ambulation/Gait assistance: Contact guard assist Gait Distance (Feet): 150 Feet Assistive device: Rolling walker (2 wheels) Gait Pattern/deviations: Step-to pattern, Decreased stride length, Decreased weight shift to right Gait velocity: decreased     General Gait Details: step-to gait pattern with decreased RLE stance time, improving to slight step through gait pattern, UE assisting on RW needing 1 standing rest break to rest arms, KI on without knee buckling noted   Stairs             Wheelchair Mobility     Tilt Bed    Modified Rankin (Stroke Patients Only)       Balance Overall balance assessment: Needs assistance Sitting-balance support: Feet supported Sitting balance-Leahy Scale: Good     Standing balance support: Reliant on assistive device for balance, During functional activity, Bilateral upper extremity supported Standing balance-Leahy Scale: Fair Standing balance comment: static able to release single UE, dynamic using RW                            Cognition Arousal: Alert Behavior During Therapy: WFL for tasks assessed/performed Overall Cognitive Status: Within Functional Limits for tasks assessed  Exercises Total Joint Exercises Ankle Circles/Pumps: Seated, AROM, Both, 10 reps Quad Sets: Seated, AROM, Strengthening, Both, 10 reps Straight Leg Raises:  (unable RLE)    General Comments        Pertinent Vitals/Pain Pain Assessment Pain Assessment: 0-10 Pain Score: 5  Pain Location: R knee Pain Descriptors / Indicators: Grimacing, Guarding Pain  Intervention(s): Limited activity within patient's tolerance, Monitored during session, Premedicated before session, Repositioned, Ice applied    Home Living Family/patient expects to be discharged to:: Private residence Living Arrangements: Spouse/significant other Available Help at Discharge: Family;Available 24 hours/day Type of Home: House Home Access: Stairs to enter Entrance Stairs-Rails: Right;Left;Can reach both Entrance Stairs-Number of Steps: 2   Home Layout: One level Home Equipment: Agricultural consultant (2 wheels);Cane - single point;Grab bars - tub/shower;Grab bars - toilet      Prior Function            PT Goals (current goals can now be found in the care plan section) Acute Rehab PT Goals Patient Stated Goal: regain strength and independence PT Goal Formulation: With patient Time For Goal Achievement: 06/16/23 Potential to Achieve Goals: Good Progress towards PT goals: Progressing toward goals    Frequency    7X/week      PT Plan      Co-evaluation              AM-PAC PT "6 Clicks" Mobility   Outcome Measure  Help needed turning from your back to your side while in a flat bed without using bedrails?: A Little Help needed moving from lying on your back to sitting on the side of a flat bed without using bedrails?: A Little Help needed moving to and from a bed to a chair (including a wheelchair)?: A Little Help needed standing up from a chair using your arms (e.g., wheelchair or bedside chair)?: A Little Help needed to walk in hospital room?: A Little Help needed climbing 3-5 steps with a railing? : A Lot 6 Click Score: 17    End of Session Equipment Utilized During Treatment: Gait belt Activity Tolerance: Patient tolerated treatment well Patient left: in chair;with call bell/phone within reach;with family/visitor present Nurse Communication: Mobility status PT Visit Diagnosis: Muscle weakness (generalized) (M62.81);Pain;Difficulty in walking, not  elsewhere classified (R26.2) Pain - Right/Left: Right Pain - part of body: Knee     Time: 4098-1191 PT Time Calculation (min) (ACUTE ONLY): 16 min  Charges:    $Gait Training: 8-22 mins PT General Charges $$ ACUTE PT VISIT: 1 Visit                     Tori Cristen Bredeson PT, DPT 06/02/23, 1:57 PM

## 2023-06-02 NOTE — Plan of Care (Signed)

## 2023-06-02 NOTE — TOC Transition Note (Signed)
Transition of Care [facility]) - CM/SW Discharge Note  Patient Details  Name: [name] [name] MRN: [id number] Date of Birth: Jun 17, 1951  Transition of Care [facility]) CM/SW Contact:  Ewing Schlein, LCSW Phone Number: 06/02/2023, 11:07 AM  Clinical Narrative: CSW met with patient to confirm discharge plan. Patient will go home with OPPT at [facility]. Patient has a rolling walker at home from a previous surgery, so there are no DME needs at this time. TOC signing off.    Final next level of care: OP Rehab Barriers to Discharge: No Barriers Identified  Patient Goals and CMS Choice Choice offered to / list presented to : NA  Discharge Plan and Services Additional resources added to the After Visit Summary for          DME Arranged: N/A DME Agency: NA  Social Determinants of Health (SDOH) Interventions SDOH Screenings   Food Insecurity: No Food Insecurity (06/01/2023)  Housing: Low Risk  (06/01/2023)  Transportation Needs: No Transportation Needs (06/01/2023)  Utilities: Not At Risk (06/01/2023)  Depression (PHQ2-9): Low Risk  (07/14/2022)  Tobacco Use: Low Risk  (06/01/2023)   Readmission Risk Interventions    02/22/2023    2:16 PM  Readmission Risk Prevention Plan  Post Dischage Appt Complete  Medication Screening Complete  Transportation Screening Complete

## 2023-06-02 NOTE — Progress Notes (Signed)
   Subjective: 1 Day Post-Op Procedure(s) (LRB): REIMPLANTATION/REVISION OF TOTAL KNEE (Right) Patient reports pain as mild.   Patient seen in rounds with Dr. Charlann Boxer. Patient is well, and has had no acute complaints or problems. No acute events overnight. Foley catheter removed. Patient has not been up with PT yet.  We will start therapy today.   Objective: Vital signs in last 24 hours: Temp:  [97.5 F (36.4 C)-99.1 F (37.3 C)] 97.8 F (36.6 C) (08/30 0951) Pulse Rate:  [55-85] 60 (08/30 0951) Resp:  [12-20] 17 (08/30 0951) BP: (96-122)/(58-80) 110/61 (08/30 0951) SpO2:  [94 %-100 %] 96 % (08/30 0951)  Intake/Output from previous day:  Intake/Output Summary (Last 24 hours) at 06/02/2023 1257 Last data filed at 06/02/2023 1244 Gross per 24 hour  Intake 3072.45 ml  Output 3850 ml  Net -777.55 ml     Intake/Output this shift: Total I/O In: 447.6 [P.O.:360; I.V.:87.6] Out: 800 [Urine:800]  Labs: Recent Labs    06/02/23 0344  HGB 11.2*   Recent Labs    06/02/23 0344  WBC 9.5  RBC 3.76*  HCT 34.8*  PLT 164   Recent Labs    06/02/23 0344  NA 133*  K 4.0  CL 102  CO2 25  BUN 17  CREATININE 0.81  GLUCOSE 170*  CALCIUM 8.1*   No results for input(s): "LABPT", "INR" in the last 72 hours.  Exam: General - Patient is Alert and Oriented Extremity - Neurologically intact Sensation intact distally Intact pulses distally Dorsiflexion/Plantar flexion intact Dressing - dressing C/D/I Motor Function - intact, moving foot and toes well on exam.   Past Medical History:  Diagnosis Date   Arthritis    Bronchitis    Depression    GERD (gastroesophageal reflux disease)    Hypertension    Skin cancer     Assessment/Plan: 1 Day Post-Op Procedure(s) (LRB): REIMPLANTATION/REVISION OF TOTAL KNEE (Right) Principal Problem:   S/P revision of total knee, right  Estimated body mass index is 33.97 kg/m as calculated from the following:   Height as of this encounter:  5\' 9"  (1.753 m).   Weight as of this encounter: 104.3 kg. Advance diet Up with therapy    DVT Prophylaxis - Aspirin Weight bearing as tolerated.  Hgb stable at 11.2 this AM.  Plan is to go Home after hospital stay.   Likely discharge home tomorrow. Patient is requiring IV pain medication and having difficulty with pain.  Will use IV toradol today for some relief.    Patient is scheduled for OPPT. Follow up in the office in 2 weeks.   Rosalene Billings, PA-C Orthopedic Surgery [phone] 06/02/2023, 12:57 PM

## 2023-06-03 LAB — CBC
HCT: 30.6 % — ABNORMAL LOW (ref 39.0–52.0)
Hemoglobin: 9.9 g/dL — ABNORMAL LOW (ref 13.0–17.0)
MCH: 30.5 pg (ref 26.0–34.0)
MCHC: 32.4 g/dL (ref 30.0–36.0)
MCV: 94.2 fL (ref 80.0–100.0)
Platelets: 151 10*3/uL (ref 150–400)
RBC: 3.25 MIL/uL — ABNORMAL LOW (ref 4.22–5.81)
RDW: 17.2 % — ABNORMAL HIGH (ref 11.5–15.5)
WBC: 6.9 10*3/uL (ref 4.0–10.5)
nRBC: 0 % (ref 0.0–0.2)

## 2023-06-03 MED ORDER — OXYCODONE HCL 10 MG PO TABS: 10.0000 mg | ORAL_TABLET | Freq: Four times a day (QID) | ORAL | 0 refills | Status: DC | PRN

## 2023-06-03 MED ORDER — TIZANIDINE HCL 2 MG PO TABS: 2.0000 mg | ORAL_TABLET | Freq: Three times a day (TID) | ORAL | 1 refills | Status: DC | PRN

## 2023-06-03 NOTE — Progress Notes (Signed)
Provided discharge education/instructions, all questions and concerns addressed. Pt is not in any distress, discharged home with all of his belongings accompanied by wife.

## 2023-06-03 NOTE — Progress Notes (Signed)
Physical Therapy Treatment Patient Details Name: [name] [name] MRN: [id number] DOB: 23-Jan-1951 Today's Date: 06/03/2023   History of Present Illness [name] [name], 72 y.o. 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 PMH: HTN, GERD, skin cancer, depression    PT Comments  Pt making excellent progress this session, goals met; feels ready to d/c home with f/u OPPT.     If plan is discharge home, recommend the following: A little help with walking and/or transfers;A little help with bathing/dressing/bathroom;Assistance with cooking/housework;Assist for transportation;Help with stairs or ramp for entrance   Can travel by private vehicle        Equipment Recommendations  None recommended by PT    Recommendations for Other Services       Precautions / Restrictions Precautions Precautions: Fall Restrictions Weight Bearing Restrictions: No RLE Weight Bearing: Weight bearing as tolerated     Mobility  Bed Mobility Overal bed mobility: Needs Assistance Bed Mobility: Supine to Sit, Sit to Supine     Supine to sit: Supervision Sit to supine: Supervision   General bed mobility comments: for safety only    Transfers Overall transfer level: Needs assistance Equipment used: Rolling walker (2 wheels) Transfers: Sit to/from Stand Sit to Stand: Supervision           General transfer comment: cues for hand placement; good carryover from previous session. no physical assist, supervision for safety    Ambulation/Gait Ambulation/Gait assistance: Supervision, Modified independent (Device/Increase time) Gait Distance (Feet): 120 Feet Assistive device: Rolling walker (2 wheels) Gait Pattern/deviations: Step-through pattern, Decreased stride length       General Gait Details: improving to step through pattern with  incr stride length. no knee buckling, incr wt shift to R LE   Stairs Stairs: Yes Stairs assistance: Contact guard assist, Supervision Stair Management: Two rails, Step to pattern, Forwards Number of Stairs: 3 General stair comments: cues for sequence and technique; good stability no knee buckling   Wheelchair Mobility     Tilt Bed    Modified Rankin (Stroke Patients Only)       Balance                                            Cognition Arousal: Alert Behavior During Therapy: WFL for tasks assessed/performed Overall Cognitive Status: Within Functional Limits for tasks assessed                                 General Comments: pleasant and cooperative        Exercises Total Joint Exercises Ankle Circles/Pumps: AROM, Both, 10 reps Quad Sets:  (reviewed) Heel Slides:  (reviewed technique and how to self assist)    General Comments        Pertinent Vitals/Pain Pain Assessment Pain Assessment: 0-10 Pain Score: 5  Pain Location: R knee Pain Descriptors / Indicators: Grimacing, Guarding Pain Intervention(s): Limited activity within patient's tolerance, Monitored during session, Premedicated before session, Repositioned    Home Living                          Prior Function  PT Goals (current goals can now be found in the care plan section) Acute Rehab PT Goals Patient Stated Goal: regain strength and independence PT Goal Formulation: With patient Time For Goal Achievement: 06/16/23 Potential to Achieve Goals: Good Progress towards PT goals: Progressing toward goals    Frequency    7X/week      PT Plan      Co-evaluation              AM-PAC PT "6 Clicks" Mobility   Outcome Measure  Help needed turning from your back to your side while in a flat bed without using bedrails?: None Help needed moving from lying on your back to sitting on the side of a flat bed without using bedrails?:  None Help needed moving to and from a bed to a chair (including a wheelchair)?: A Little Help needed standing up from a chair using your arms (e.g., wheelchair or bedside chair)?: A Little Help needed to walk in hospital room?: A Little Help needed climbing 3-5 steps with a railing? : A Little 6 Click Score: 20    End of Session Equipment Utilized During Treatment: Gait belt Activity Tolerance: Patient tolerated treatment well Patient left: in bed;with call bell/phone within reach;with bed alarm set   PT Visit Diagnosis: Muscle weakness (generalized) (M62.81);Pain;Difficulty in walking, not elsewhere classified (R26.2) Pain - Right/Left: Right Pain - part of body: Knee     Time: 9528-4132 PT Time Calculation (min) (ACUTE ONLY): 24 min  Charges:    $Gait Training: 23-37 mins PT General Charges $$ ACUTE PT VISIT: 1 Visit                     [name] [name], PT  Acute Rehab Dept [facility]) [phone]  06/03/2023    [facility] 06/03/2023, 1:10 PM

## 2023-06-03 NOTE — Plan of Care (Signed)

## 2023-06-07 ENCOUNTER — Telehealth: Payer: Self-pay | Admitting: *Deleted

## 2023-06-07 ENCOUNTER — Encounter: Payer: Self-pay | Admitting: *Deleted

## 2023-06-07 NOTE — Transitions of Care (Post Inpatient/ED Visit) (Signed)
06/07/2023  Name: [name] [name] MRN: [id number] DOB: March 22, 1951  Today's TOC FU Call Status: Today's TOC FU Call Status:: Successful TOC FU Call Completed TOC FU Call Complete Date: 06/07/23 Patient's Name and Date of Birth confirmed.  Transition Care Management Follow-up Telephone Call Date of Discharge: 06/03/23 Discharge Facility: [facility]) Type of Discharge: Inpatient Admission Primary Inpatient Discharge Diagnosis:: Revision of (R) total knee How have you been since you were released from the hospital?: Better ("I am doing okay; pain is a little much but the medicine they gave me is helping.  Going to see orthopedic surgeon in mid-September; so far so good") Any questions or concerns?: No  Items Reviewed: Did you receive and understand the discharge instructions provided?: Yes (thoroughly reviewed with patient who verbalizes good understanding of same) Medications obtained,verified, and reconciled?: Yes (Medications Reviewed) (Full medication reconciliation/ review completed; no concerns or discrepancies identified; confirmed patient obtained/ is taking all newly Rx'd medications as instructed; self-manages medications and denies questions/ concerns around medications today) Any new allergies since your discharge?: No Dietary orders reviewed?: Yes Type of Diet Ordered:: "Healthy as possible" Do you have support at home?: Yes People in Home: spouse Name of Support/Comfort Primary Source: Reports independent in self-care activities; supportive spouse assists as/ if needed/ indicated  Medications Reviewed Today: Medications Reviewed Today     Reviewed by Michaela Corner, RN (Registered Nurse) on 06/07/23 at 1149  Med List Status: <None>   Medication Order Taking? Sig Documenting Provider Last Dose Status Informant  aspirin 81 MG chewable tablet [id number] Yes Chew 81 mg by mouth 2 (two) times daily. 06/07/23: Reports during TOC call ordered by orthopedic surgeon at time of  hospital discharge on 06/03/23 Myrlene Broker, MD Taking Active Self           Med Note Otelia Limes Jun 07, 2023 11:45 AM) 06/07/23: Reports during [facility] call ordered by orthopedic surgeon at time of hospital discharge on 06/03/23   calcium carbonate (TUMS - DOSED IN MG ELEMENTAL CALCIUM) 500 MG chewable tablet [id number] Yes Chew 2 tablets by mouth 2 (two) times daily as needed for indigestion or heartburn. [provider] Taking Active Self           Med Note Kimber Relic May 15, 2023  9:52 AM)    diclofenac (VOLTAREN) 75 MG EC tablet [id number] No Take 75 mg by mouth 2 (two) times daily.  Patient not taking: Reported on 06/07/2023   [provider] Not Taking Active Self           Med Note Otelia Limes Jun 07, 2023 11:41 AM) 06/07/23: Reports during TOC call no longer taking   gabapentin (NEURONTIN) 100 MG capsule [id number] No Take 200 mg by mouth at bedtime.  Patient not taking: Reported on 06/07/2023   [provider] Not Taking Active            Med Note Otelia Limes Jun 07, 2023 11:41 AM) 06/07/23: Reports during TOC call no longer taking   Ketotifen Fumarate (ALLERGY EYE DROPS OP) [id number] Yes Place 1 drop into both eyes daily as needed (red/itchy eyes). [provider] Taking Active Self  Magnesium 500 MG TABS [id number] Yes Take 500 mg by mouth every morning. [provider] Taking Active Self           Med Note Kimber Relic May 15, 2023  9:50 AM)    oxyCODONE 10 MG TABS [id number] Yes Take 1 tablet (10 mg total) by mouth every 6 (six) hours as needed for moderate pain (pain score 4-6). Shuford, French Ana, PA-C Taking Active   polyethylene glycol (MIRALAX / GLYCOLAX) 17 g packet [id number] Yes Take 17 g by mouth 2 (two) times daily.  Patient taking differently: Take 17 g by mouth daily as needed for mild constipation or moderate constipation.   Cassandria Anger, PA-C Taking Active Self  pregabalin  (LYRICA) 75 MG capsule [id number]  Take 75 mg by mouth 2 (two) times daily. [provider]  Active Self           Med Note Michaela Corner   Wed Jun 07, 2023 11:40 AM) 06/07/23: Reports during TOC call no longer taking  saccharomyces boulardii (FLORASTOR) 250 MG capsule [id number] Yes Take 500 mg by mouth daily. [provider] Taking Active Self  sertraline (ZOLOFT) 100 MG tablet [id number] Yes TAKE 1 TABLET BY MOUTH EVERY DAY Myrlene Broker, MD Taking Active Self  sodium chloride (OCEAN) 0.65 % SOLN nasal spray [id number] Yes Place 1 spray into both nostrils daily as needed for congestion. [provider] Taking Active Self  tamsulosin (FLOMAX) 0.4 MG CAPS capsule [id number] Yes Take 0.4 mg by mouth at bedtime. [provider] Taking Active Self           Med Note Delton See, LISA Elbert Ewings   Sat Dec 20, 2014  5:27 PM)    telmisartan (MICARDIS) 80 MG tablet [id number] Yes TAKE 1 TABLET BY MOUTH EVERY DAY Myrlene Broker, MD Taking Active Self  tiZANidine (ZANAFLEX) 2 MG tablet [id number] Yes Take 1-2 tablets (2-4 mg total) by mouth every 8 (eight) hours as needed for muscle spasms. Shuford, French Ana, PA-C Taking Active            Home Care and Equipment/Supplies: Were Home Health Services Ordered?: No (reports established with outpatient PT/ rehab) Any new equipment or medical supplies ordered?: No  Functional Questionnaire: Do you need assistance with bathing/showering or dressing?: No Do you need assistance with meal preparation?: No Do you need assistance with eating?: No Do you have difficulty maintaining continence: No Do you need assistance with getting out of bed/getting out of a chair/moving?: No Do you have difficulty managing or taking your medications?: No  Follow up appointments reviewed: PCP Follow-up appointment confirmed?: NA (verified not indicated per hospital discharging provider discharge notes) Specialist Hospital Follow-up appointment  confirmed?: Yes (verified this is recommended time frame for follow up per hospital discharging provider notes) Date of Specialist follow-up appointment?: 06/15/23 Follow-Up Specialty Provider:: orthopedic surgeon Do you need transportation to your follow-up appointment?: No Do you understand care options if your condition(s) worsen?: Yes-patient verbalized understanding  SDOH Interventions Today    Flowsheet Row Most Recent Value  SDOH Interventions   Food Insecurity Interventions Intervention Not Indicated  Transportation Interventions Intervention Not Indicated  [wife providing transportation post-op]      TOC Interventions Today    Flowsheet Row Most Recent Value  TOC Interventions   TOC Interventions Discussed/Reviewed TOC Interventions Discussed  [confirmed currently established with RN CM Care Coordinator with scheduled follow up telephone visit 06/15/23]      Interventions Today    Flowsheet Row Most Recent Value  Chronic Disease   Chronic disease during today's visit Other  General Interventions   General Interventions Discussed/Reviewed General Interventions Discussed, Durable Medical Equipment (DME), Doctor Visits, Communication with  Doctor Visits  Discussed/Reviewed Specialist  Durable Medical Equipment (DME) Other, Val Riles currently requiring/ using assistive devices - walker]  PCP/Specialist Visits Compliance with follow-up visit  Communication with RN  Exercise Interventions   Exercise Discussed/Reviewed Exercise Discussed  [confirmed established with outpatient PT]  Nutrition Interventions   Nutrition Discussed/Reviewed Nutrition Discussed  Pharmacy Interventions   Pharmacy Dicussed/Reviewed Pharmacy Topics Discussed  [Full medication review with updating medication list in EHR per patient report]  Safety Interventions   Safety Discussed/Reviewed Safety Discussed, Fall Risk      Caryl Pina, RN, BSN, CCRN Alumnus RN CM Care  Coordination/ Transition of Care- [facility] Care Management [phone]: direct office

## 2023-06-14 DIAGNOSIS — M25661 Stiffness of right knee, not elsewhere classified: Secondary | ICD-10-CM | POA: Diagnosis not present

## 2023-06-14 DIAGNOSIS — Z471 Aftercare following joint replacement surgery: Secondary | ICD-10-CM | POA: Diagnosis not present

## 2023-06-14 DIAGNOSIS — Z96651 Presence of right artificial knee joint: Secondary | ICD-10-CM | POA: Diagnosis not present

## 2023-06-14 DIAGNOSIS — M25561 Pain in right knee: Secondary | ICD-10-CM | POA: Diagnosis not present

## 2023-06-15 ENCOUNTER — Ambulatory Visit: Payer: Self-pay

## 2023-06-15 NOTE — Patient Instructions (Signed)
Visit Information  Thank you for taking time to visit with me today. Please don't hesitate to contact me if I can be of assistance to you.   Following are the goals we discussed today:  Continue to take medications as prescribed. Continue to attend provider visits as scheduled Contact provider for health questions/concerns as needed  Our next appointment is by telephone on 07/06/23 at 2:00 pm  Please call the care guide team at [phone] if you need to cancel or reschedule your appointment.   If you are experiencing a Mental Health or Behavioral Health Crisis or need someone to talk to, please call the Suicide and Crisis Lifeline: 988 call the [place] National Suicide Prevention Lifeline: 929-720-9659 or TTY: 785-608-3303 TTY 272-590-2950) to talk to a trained counselor call 1-800-273-TALK (toll free, 24 hour hotline)  Kathyrn Sheriff, RN, MSN, BSN, CCM Care Management Coordinator [phone]

## 2023-06-15 NOTE — Patient Outreach (Signed)
  Care Coordination   Follow Up Visit Note   06/15/2023 Name: [name] [name] MRN: [id number] DOB: 1951/04/09  [name] is a 73 y.o. year old male who sees Myrlene Broker, MD for primary care. I spoke with  [name] by phone today.  What matters to the patients health and wellness today?  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 Mr. Mulloy reports swelling of knee drained per orthopedic provider. He states he will start outpatient physical therapy on 06/20/23. He states knee is wrapped per provider and he is icing knee as recommended. He reports pain level about "6-7" and is taking pain medication that helps.   Goals Addressed             This Visit's Progress    Care Coordination Activities-contiue to improve post hospitalization       Interventions Today    Flowsheet Row Most Recent Value  Chronic Disease   Chronic disease during today's visit Other  [8/29-8/31 reimplantation/revision of Right total knee]  General Interventions   General Interventions Discussed/Reviewed General Interventions Reviewed  Doctor Visits Discussed/Reviewed Doctor Visits Reviewed, Specialist  PCP/Specialist Visits Compliance with follow-up visit  [reviewed upcoming scheduled appointments]  Education Interventions   Education Provided Provided Education  Provided Verbal Education On Other, When to see the doctor, Medication  [advised continue to attend provider appointments as scheduled, take medications as prescribed, contact orthopedic provider with health questions or concerns regarding your recent surgery.]  Pharmacy Interventions   Pharmacy Dicussed/Reviewed Pharmacy Topics Discussed  [medication review completed]  Safety Interventions   Safety Discussed/Reviewed Fall Risk, Safety Discussed            SDOH assessments and interventions completed:  No  Care Coordination Interventions:  Yes, provided   Follow up plan: Follow up call scheduled for 07/06/23    Encounter  Outcome:  Patient Visit Completed   Kathyrn Sheriff, RN, MSN, BSN, CCM Care Management Coordinator [phone]

## 2023-06-17 IMAGING — DX DG ANKLE COMPLETE 3+V*R*
3 series · 3 of 3 positions shown · non-contrast
Comparison: None.

CLINICAL DATA: Right ankle and right knee pain after walking up a
ramp a target yesterday. Initial encounter.

EXAM:
RIGHT ANKLE - COMPLETE 3+ VIEW

[ankle ap]
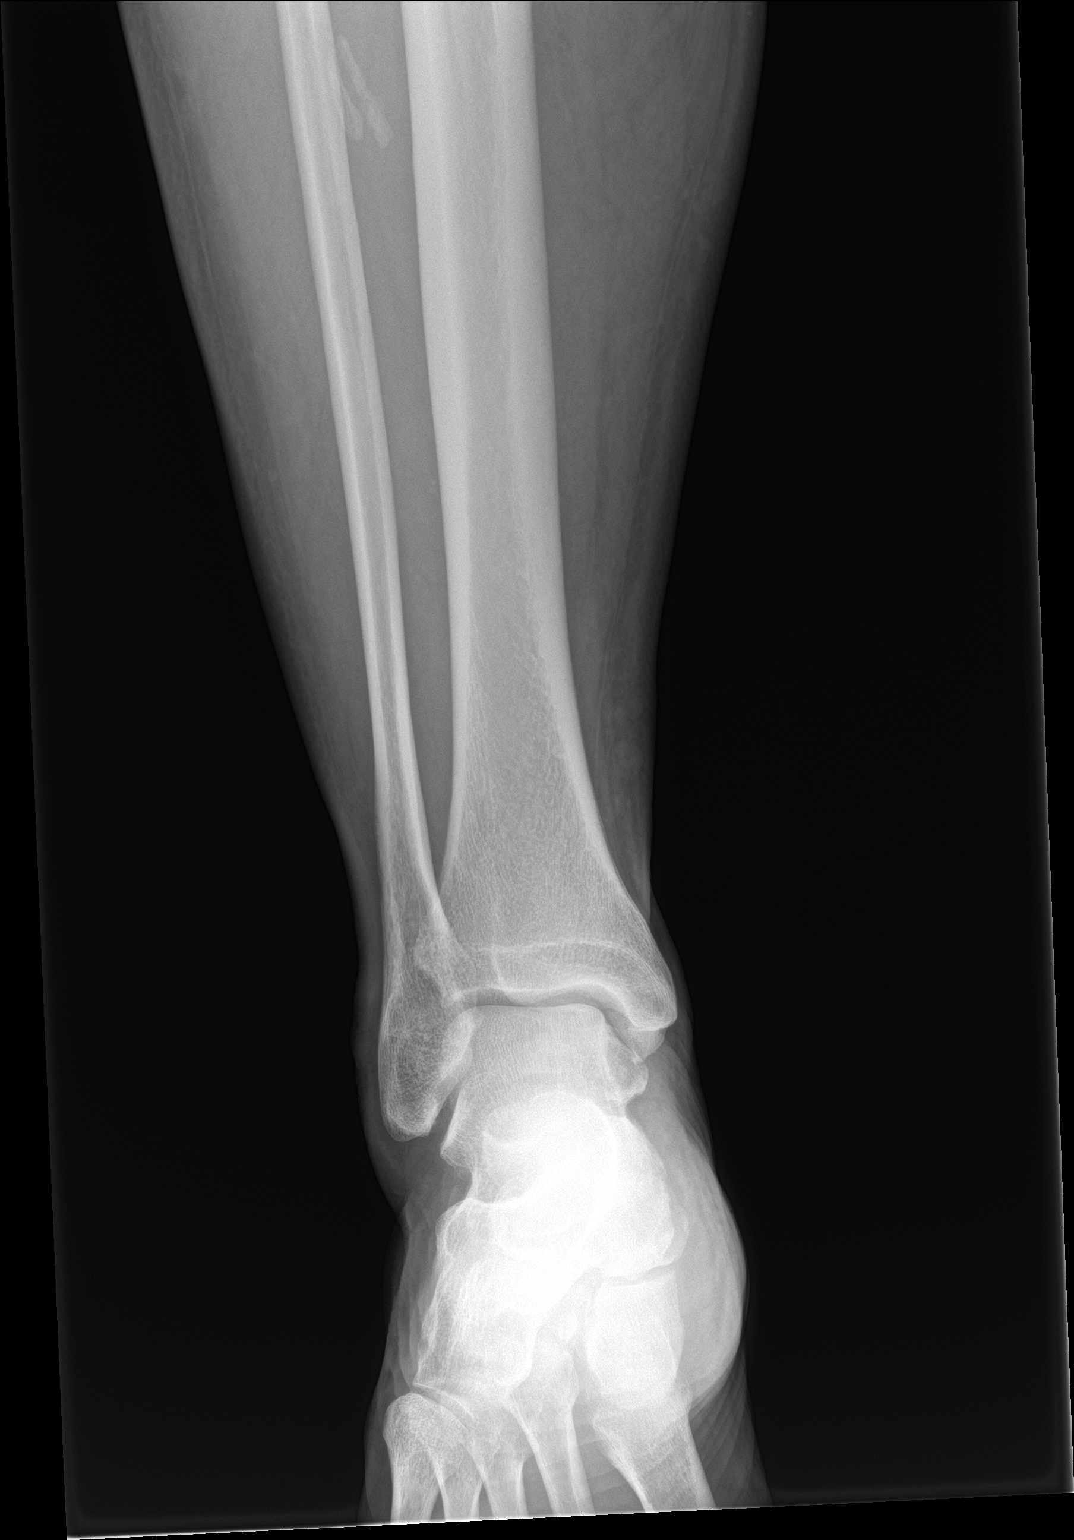

[ankle obl]
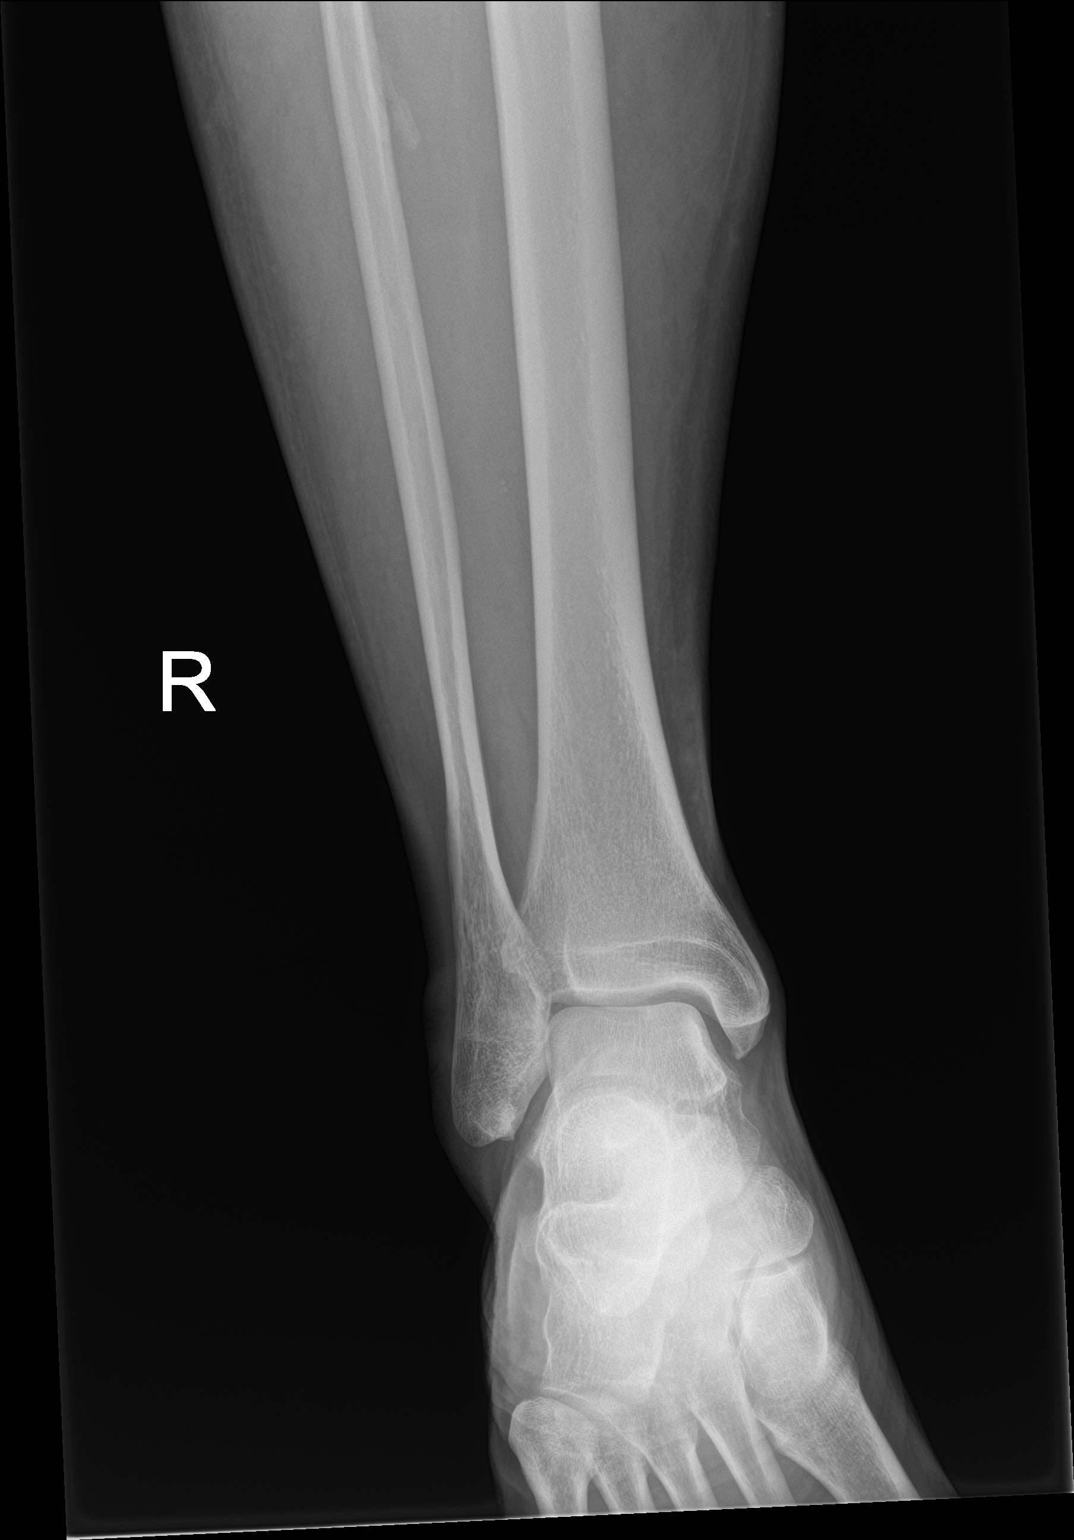

[ankle lat]
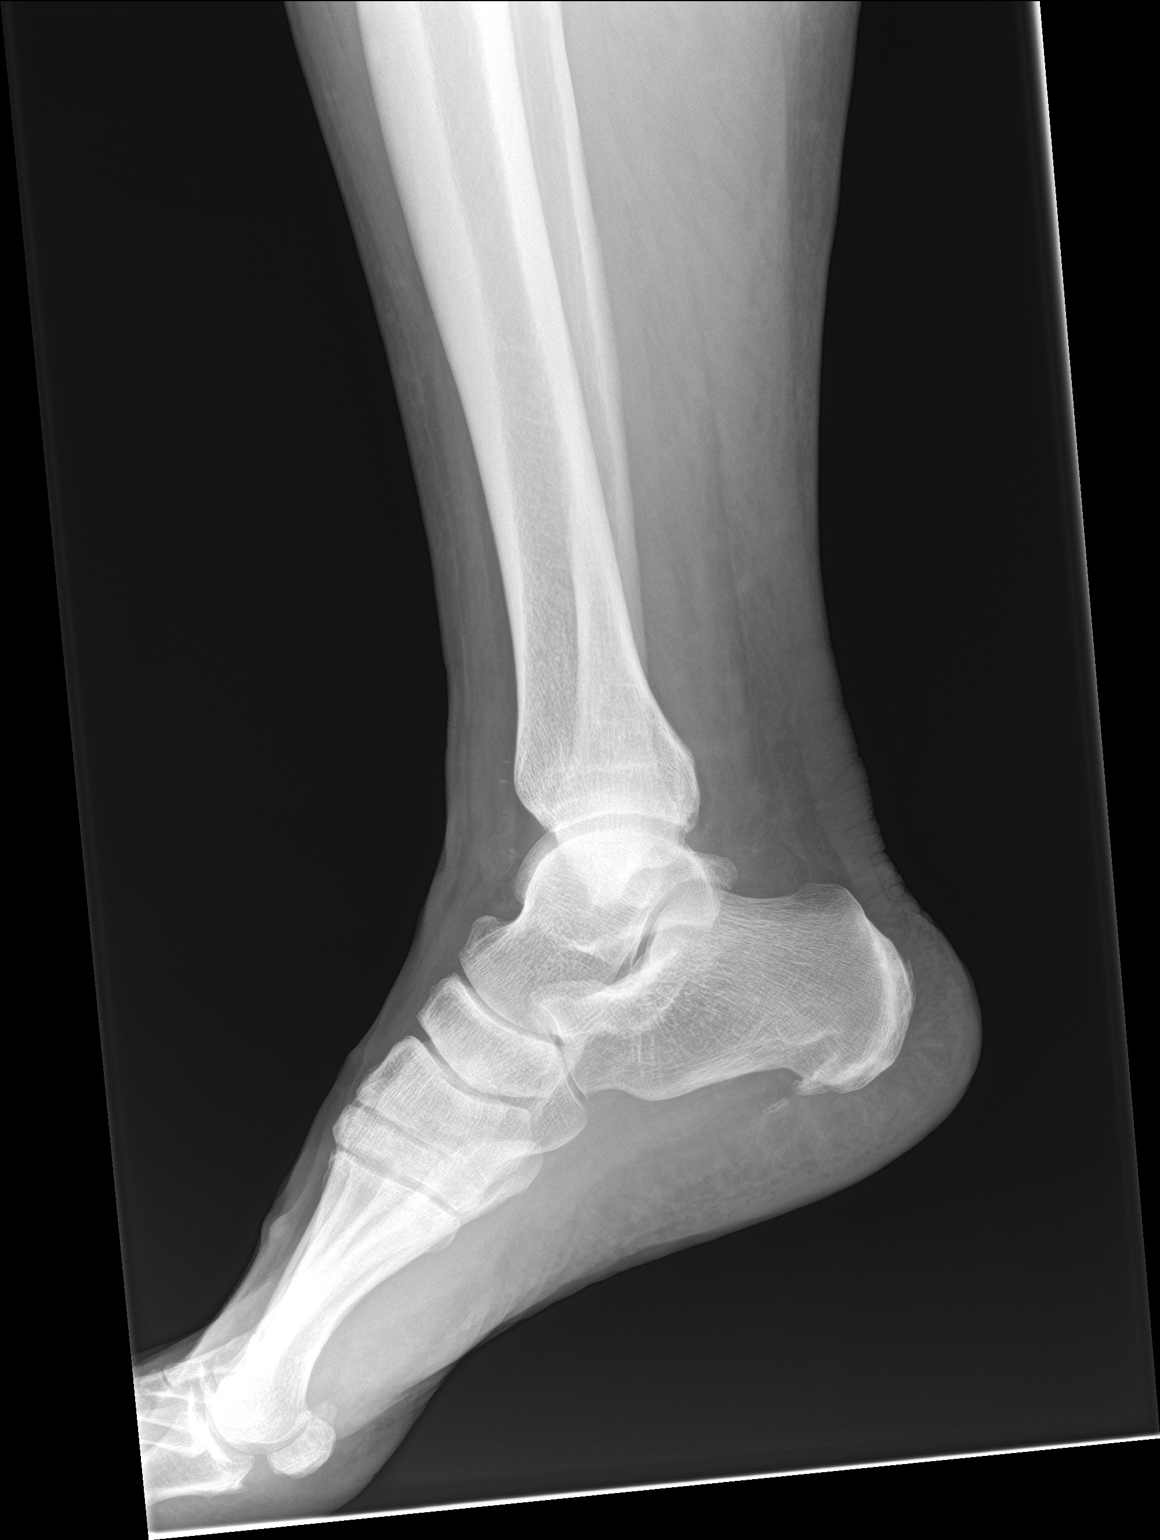

[3 of 3 positions shown; findings below may reference images not displayed]

FINDINGS: No acute osseous or joint abnormality. Calcaneal spur. Soft tissue
calcifications project along the mid fibular shaft.
IMPRESSION: No acute findings.

## 2023-06-17 IMAGING — DX DG KNEE COMPLETE 4+V*R*
4 series · 4 of 4 positions shown · non-contrast
Comparison: None.

CLINICAL DATA: Right shin pain

EXAM:
RIGHT KNEE - COMPLETE 4+ VIEW

[knee ap]
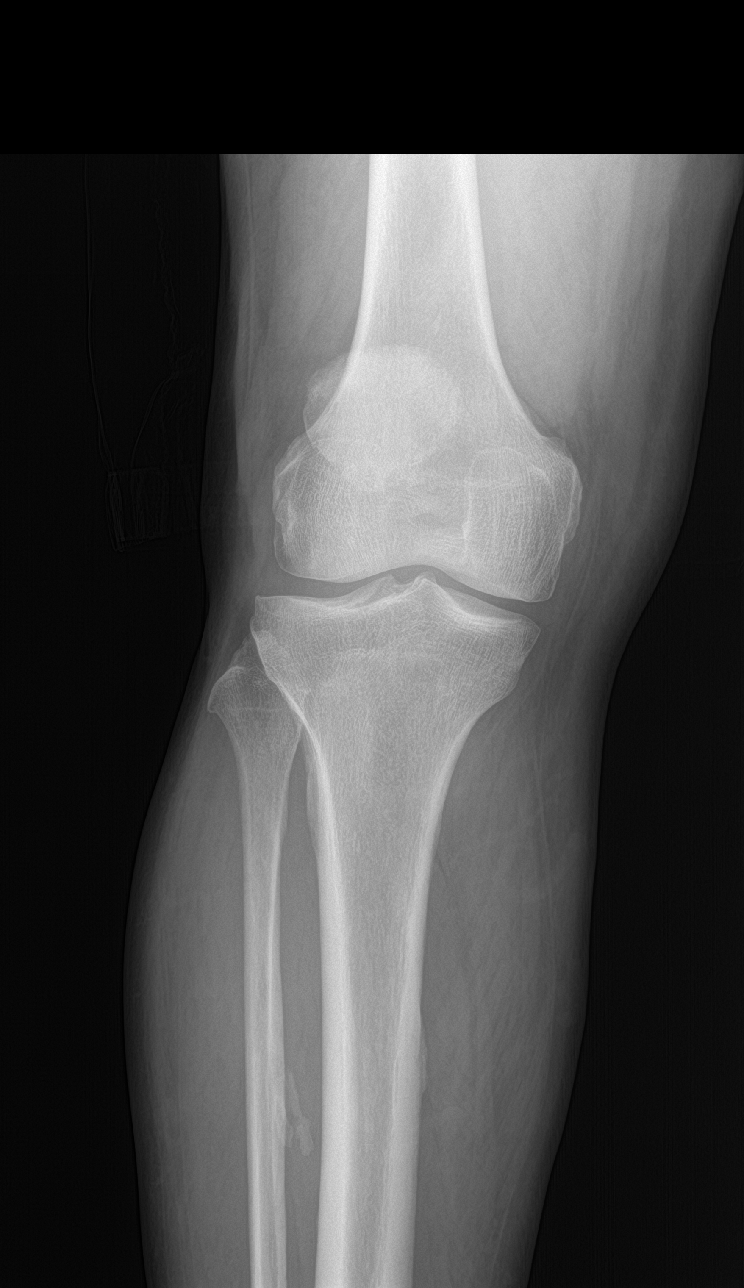

[knee obl (1 of 2)]
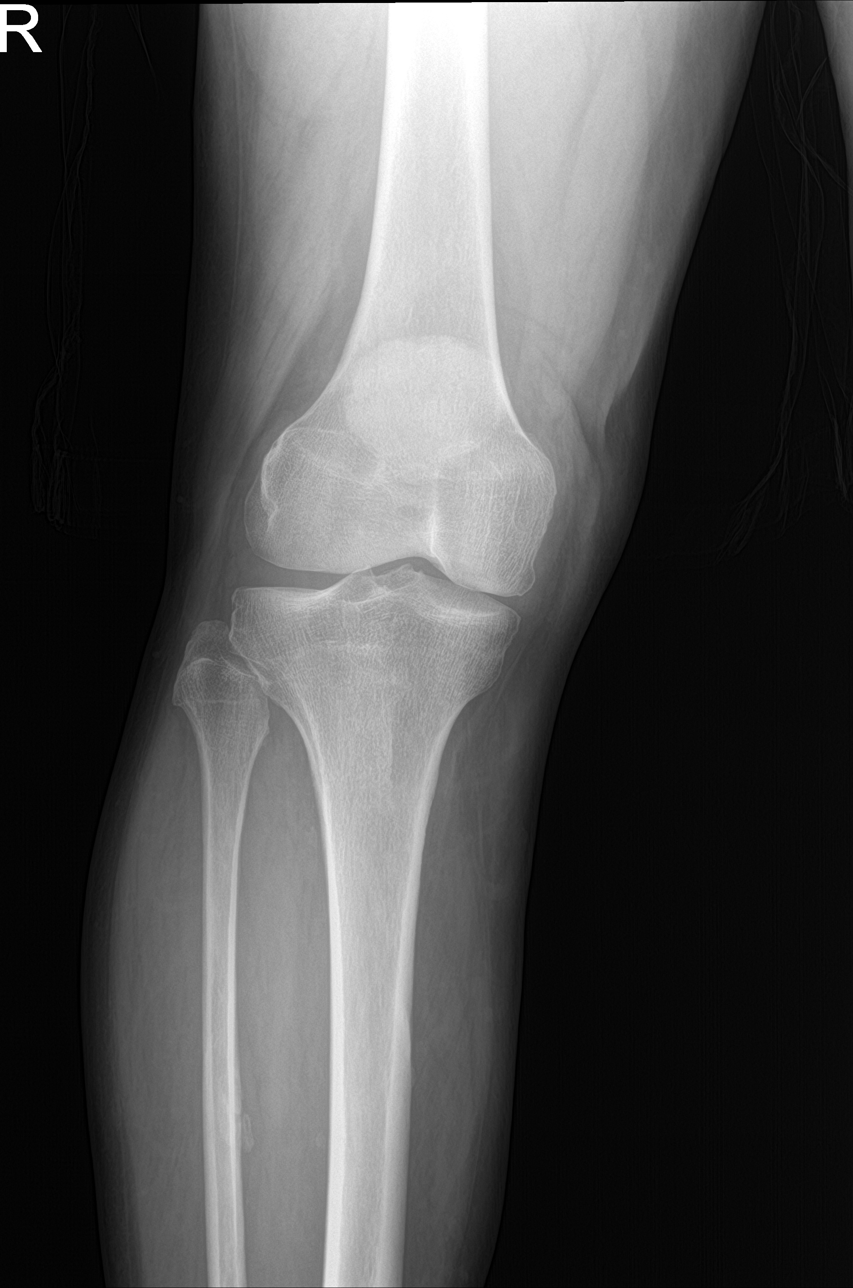

[knee obl (2 of 2)]
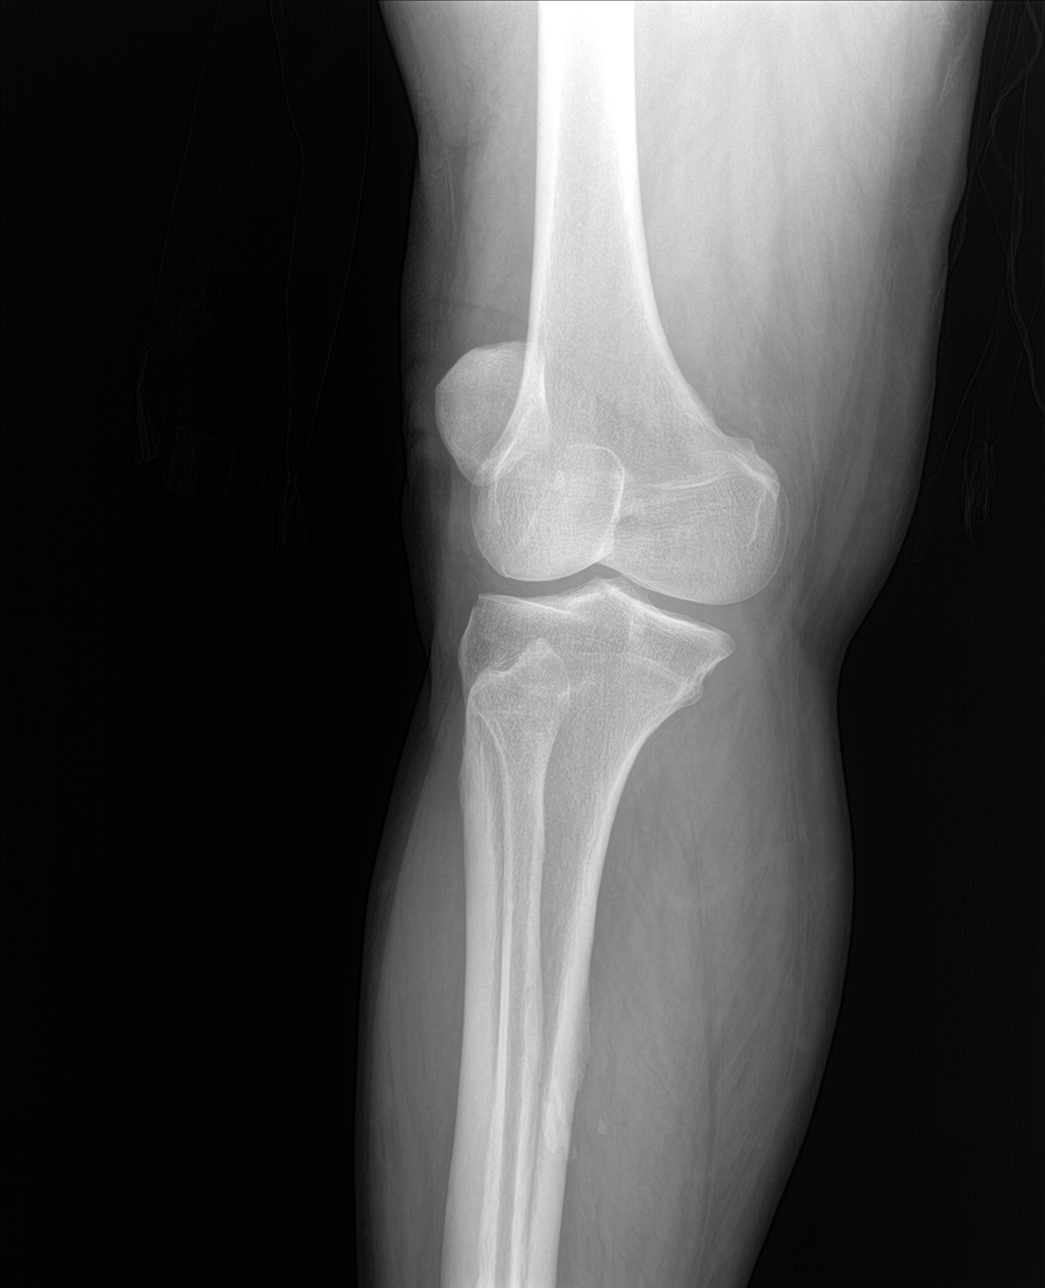

[knee lat]
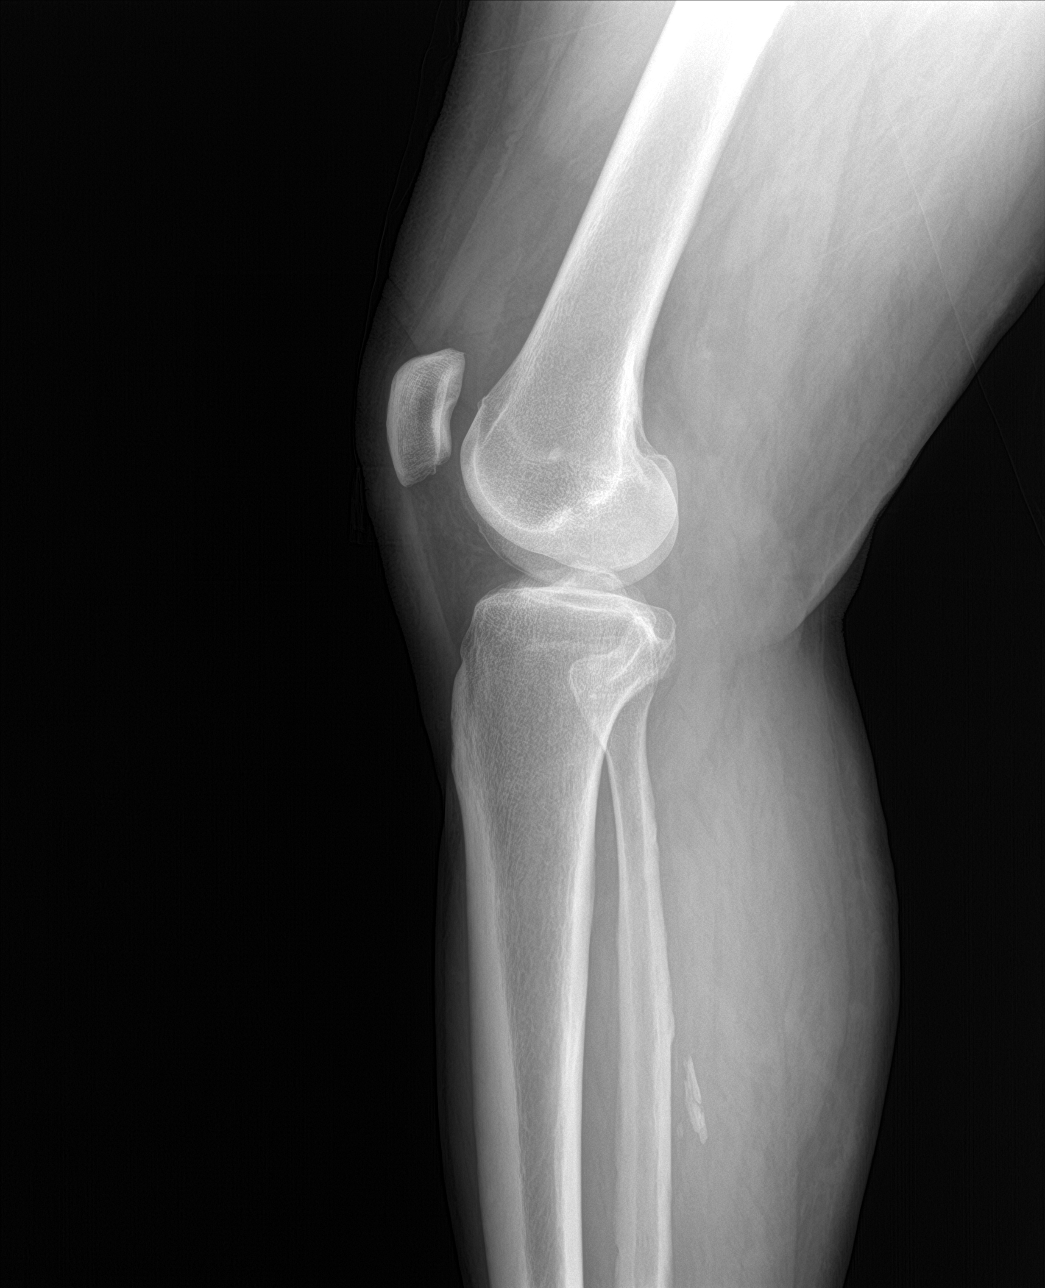

[4 of 4 positions shown; findings below may reference images not displayed]

FINDINGS: No acute fracture or dislocation. No aggressive osseous lesion.
Normal alignment. Small area of heterotopic calcification along the
posterolateral aspect of the right mid lower leg.

Soft tissue are unremarkable. No radiopaque foreign body or soft
tissue emphysema.
IMPRESSION: No acute osseous injury of the right knee.

## 2023-06-19 NOTE — Discharge Summary (Signed)
Patient ID: [name] [name] MRN: [id number] DOB/AGE: 04-Nov-1950 72 y.o.  Admit date: 06/01/2023 Discharge date: 06/03/2023  Admission Diagnoses:  Status post right total knee resection with antibiotic spacer   Discharge Diagnoses:  Principal Problem:   S/P revision of total knee, right   Past Medical History:  Diagnosis Date   Arthritis    Bronchitis    Depression    GERD (gastroesophageal reflux disease)    Hypertension    Skin cancer     Surgeries: Procedure(s): REIMPLANTATION/REVISION OF TOTAL KNEE on 06/01/2023   Consultants:   Discharged Condition: Improved  Hospital Course: [name] ACCOMANDO is an 72 y.o. male who was admitted 06/01/2023 for operative treatment ofS/P revision of total knee, right. Patient has severe unremitting pain that affects sleep, daily activities, and work/hobbies. After pre-op clearance the patient was taken to the operating room on 06/01/2023 and underwent  Procedure(s): REIMPLANTATION/REVISION OF TOTAL KNEE.    Patient was given perioperative antibiotics:  Anti-infectives (From admission, onward)    Start     Dose/Rate Route Frequency Ordered Stop   06/01/23 1800  ceFAZolin (ANCEF) IVPB 2g/100 mL premix        2 g 200 mL/hr over 30 Minutes Intravenous Every 6 hours 06/01/23 1647 06/02/23 0043   06/01/23 1331  vancomycin (VANCOCIN) powder  Status:  Discontinued          As needed 06/01/23 1331 06/01/23 1647   06/01/23 0915  ceFAZolin (ANCEF) IVPB 2g/100 mL premix        2 g 200 mL/hr over 30 Minutes Intravenous On call to O.R. 06/01/23 0904 06/01/23 1143        Patient was given sequential compression devices, early ambulation, and chemoprophylaxis to prevent DVT. Patient worked with PT and was meeting their goals regarding safe ambulation and transfers.  Patient benefited maximally from hospital stay and there were no complications.    Recent vital signs: No data found.   Recent laboratory studies: No results for input(s): "WBC",  "HGB", "HCT", "PLT", "NA", "K", "CL", "CO2", "BUN", "CREATININE", "GLUCOSE", "INR", "CALCIUM" in the last 72 hours.  Invalid input(s): "PT", "2"   Discharge Medications:   Allergies as of 06/03/2023       Reactions   Avapro [irbesartan] Cough   Lexapro [escitalopram Oxalate] Other (See Comments)   Anxiety, insomnia   Chlorhexidine Rash   Reaction to PICC line dressing with Chlorhexidine gel        Medication List     TAKE these medications    ALLERGY EYE DROPS OP Place 1 drop into both eyes daily as needed (red/itchy eyes). Notes to patient: Resume home regimen   calcium carbonate 500 MG chewable tablet Commonly known as: TUMS - dosed in mg elemental calcium Chew 2 tablets by mouth 2 (two) times daily as needed for indigestion or heartburn. Notes to patient: Resume home regimen   diclofenac 75 MG EC tablet Commonly known as: VOLTAREN Take 75 mg by mouth 2 (two) times daily. Notes to patient: Resume home regimen   gabapentin 100 MG capsule Commonly known as: NEURONTIN Take 200 mg by mouth at bedtime.   Magnesium 500 MG Tabs Take 500 mg by mouth every morning. Notes to patient: Resume home regimen   Oxycodone HCl 10 MG Tabs Take 1 tablet (10 mg total) by mouth every 6 (six) hours as needed for moderate pain (pain score 4-6). Notes to patient: Last dose given 08/31 10:34am   polyethylene glycol 17 g packet Commonly known as:  MIRALAX / GLYCOLAX Take 17 g by mouth 2 (two) times daily. What changed:  when to take this reasons to take this   pregabalin 75 MG capsule Commonly known as: LYRICA Take 75 mg by mouth 2 (two) times daily. Notes to patient: Resume home regimen   saccharomyces boulardii 250 MG capsule Commonly known as: FLORASTOR Take 500 mg by mouth daily. Notes to patient: Resume home regimen   sertraline 100 MG tablet Commonly known as: ZOLOFT TAKE 1 TABLET BY MOUTH EVERY DAY   sodium chloride 0.65 % Soln nasal spray Commonly known as:  OCEAN Place 1 spray into both nostrils daily as needed for congestion. Notes to patient: Last dose given 08/30 07:09am   tamsulosin 0.4 MG Caps capsule Commonly known as: FLOMAX Take 0.4 mg by mouth at bedtime.   telmisartan 80 MG tablet Commonly known as: MICARDIS TAKE 1 TABLET BY MOUTH EVERY DAY Notes to patient: Resume home regimen   tiZANidine 2 MG tablet Commonly known as: ZANAFLEX Take 1-2 tablets (2-4 mg total) by mouth every 8 (eight) hours as needed for muscle spasms. Notes to patient: Last dose given 08/31 10:34am        Diagnostic Studies: No results found.  Disposition: Discharge disposition: 01-Home or Self Care          Follow-up Information     [name] Romans, MD. Schedule an appointment as soon as possible for a visit in 2 week(s).   Specialty: Orthopedic Surgery Contact information: [address] [place] [address] [phone]                  Signed: Cassandria Anger 06/19/2023, 2:48 PM

## 2023-06-20 DIAGNOSIS — M25561 Pain in right knee: Secondary | ICD-10-CM | POA: Diagnosis not present

## 2023-06-20 DIAGNOSIS — M25661 Stiffness of right knee, not elsewhere classified: Secondary | ICD-10-CM | POA: Diagnosis not present

## 2023-06-22 DIAGNOSIS — M25661 Stiffness of right knee, not elsewhere classified: Secondary | ICD-10-CM | POA: Diagnosis not present

## 2023-06-22 DIAGNOSIS — M25561 Pain in right knee: Secondary | ICD-10-CM | POA: Diagnosis not present

## 2023-06-29 ENCOUNTER — Ambulatory Visit: Payer: Medicare Other

## 2023-06-29 VITALS — Ht 69.0 in | Wt 230.0 lb

## 2023-06-29 DIAGNOSIS — M25661 Stiffness of right knee, not elsewhere classified: Secondary | ICD-10-CM | POA: Diagnosis not present

## 2023-06-29 DIAGNOSIS — M25561 Pain in right knee: Secondary | ICD-10-CM | POA: Diagnosis not present

## 2023-06-29 DIAGNOSIS — Z0000 Encounter for general adult medical examination without abnormal findings: Secondary | ICD-10-CM

## 2023-06-29 DIAGNOSIS — Z1211 Encounter for screening for malignant neoplasm of colon: Secondary | ICD-10-CM

## 2023-06-29 DIAGNOSIS — Z1212 Encounter for screening for malignant neoplasm of rectum: Secondary | ICD-10-CM

## 2023-06-29 NOTE — Progress Notes (Signed)
Subjective:   [name] [name] is a 72 y.o. male who presents for Medicare Annual/Subsequent preventive examination.  Visit Complete: Virtual  I connected with  [name] on 06/29/23 by a audio enabled telemedicine application and verified that I am speaking with the correct person using two identifiers.  Patient Location: Home  Provider Location: Office/Clinic  I discussed the limitations of evaluation and management by telemedicine. The patient expressed understanding and agreed to proceed.  Patient Medicare AWV questionnaire was completed by the patient on 06/26/2023; I have confirmed that all information answered by patient is correct and no changes since this date.  Because this visit was a virtual/telehealth visit, some criteria may be missing or patient reported. Any vitals not documented were not able to be obtained and vitals that have been documented are patient reported.    Cardiac Risk Factors include: advanced age (>59men, >47 women);male gender;hypertension;Other (see comment), Risk factor comments: BPH     Objective:    Today's Vitals   06/26/23 1046 06/29/23 1029  Weight:  230 lb (104.3 kg)  Height:  5\' 9"  (1.753 m)  PainSc: 5     Body mass index is 33.97 kg/m.     06/29/2023   10:40 AM 06/01/2023    4:52 PM 05/23/2023   11:11 AM 03/20/2023    7:08 PM 03/09/2023   10:23 AM 02/21/2023    8:00 PM 02/20/2023    8:29 AM  Advanced Directives  Does Patient Have a Medical Advance Directive? No Yes Yes No No No No  Type of Special educational needs teacher of [place];Living will       Does patient want to make changes to medical advance directive?  No - Patient declined       Copy of Healthcare Power of Attorney in Chart?  No - copy requested       Would patient like information on creating a medical advance directive?    No - Patient declined No - Patient declined No - Patient declined No - Patient declined    Current Medications (verified) Outpatient  Encounter Medications as of 06/29/2023  Medication Sig   aspirin 81 MG chewable tablet Chew 81 mg by mouth 2 (two) times daily. 06/07/23: Reports during TOC call ordered by orthopedic surgeon at time of hospital discharge on 06/03/23   calcium carbonate (TUMS - DOSED IN MG ELEMENTAL CALCIUM) 500 MG chewable tablet Chew 2 tablets by mouth 2 (two) times daily as needed for indigestion or heartburn.   cefadroxil (DURICEF) 500 MG capsule Take 500 mg by mouth 2 (two) times daily.   gabapentin (NEURONTIN) 100 MG capsule Take 200 mg by mouth at bedtime.   Ketotifen Fumarate (ALLERGY EYE DROPS OP) Place 1 drop into both eyes daily as needed (red/itchy eyes).   Magnesium 500 MG TABS Take 500 mg by mouth every morning.   polyethylene glycol (MIRALAX / GLYCOLAX) 17 g packet Take 17 g by mouth 2 (two) times daily. (Patient taking differently: Take 17 g by mouth daily as needed for mild constipation or moderate constipation.)   saccharomyces boulardii (FLORASTOR) 250 MG capsule Take 500 mg by mouth daily.   sertraline (ZOLOFT) 100 MG tablet TAKE 1 TABLET BY MOUTH EVERY DAY   sodium chloride (OCEAN) 0.65 % SOLN nasal spray Place 1 spray into both nostrils daily as needed for congestion.   tamsulosin (FLOMAX) 0.4 MG CAPS capsule Take 0.4 mg by mouth at bedtime.   telmisartan (MICARDIS) 80 MG tablet TAKE  1 TABLET BY MOUTH EVERY DAY   tiZANidine (ZANAFLEX) 2 MG tablet Take 1-2 tablets (2-4 mg total) by mouth every 8 (eight) hours as needed for muscle spasms.   diclofenac (VOLTAREN) 75 MG EC tablet Take 75 mg by mouth 2 (two) times daily. (Patient not taking: Reported on 06/07/2023)   oxyCODONE 10 MG TABS Take 1 tablet (10 mg total) by mouth every 6 (six) hours as needed for moderate pain (pain score 4-6).   pregabalin (LYRICA) 75 MG capsule Take 75 mg by mouth 2 (two) times daily.   No facility-administered encounter medications on file as of 06/29/2023.    Allergies (verified) Avapro [irbesartan], Lexapro  [escitalopram oxalate], and Chlorhexidine   History: Past Medical History:  Diagnosis Date   Arthritis    Bronchitis    Depression    GERD (gastroesophageal reflux disease)    Hypertension    Skin cancer    Past Surgical History:  Procedure Laterality Date   CHOLECYSTECTOMY N/A 02/10/2021   Procedure: LAPAROSCOPIC CHOLECYSTECTOMY;  Surgeon: Harriette Bouillon, MD;  Location: [facility] OR;  Service: General;  Laterality: N/A;   EXCISIONAL TOTAL KNEE ARTHROPLASTY WITH ANTIBIOTIC SPACERS Right 02/21/2023   Procedure: EXCISIONAL TOTAL KNEE ARTHROPLASTY WITH ANTIBIOTIC SPACERS;  Surgeon: Durene Romans, MD;  Location: [facility];  Service: Orthopedics;  Laterality: Right;   HERNIA REPAIR     I & D KNEE WITH POLY EXCHANGE Right 11/15/2022   Procedure: IRRIGATION AND DEBRIDEMENT KNEE WITH  POLY EXCHANGE;  Surgeon: Eugenia Mcalpine, MD;  Location: [facility];  Service: Orthopedics;  Laterality: Right;  adductor canal, no antibiotics prior to culture add on room to follow other case 61   Knee Surgery Right 10/25/2022   REIMPLANTATION OF TOTAL KNEE Right 06/01/2023   Procedure: REIMPLANTATION/REVISION OF TOTAL KNEE;  Surgeon: Durene Romans, MD;  Location: [facility];  Service: Orthopedics;  Laterality: Right;   ROTATOR CUFF REPAIR     SKIN CANCER EXCISION     Family History  Problem Relation Age of Onset   Emphysema Mother        heart failure indusce emphysema   Heart disease Father    Heart attack Father    Heart disease Sister 74       [id number]   Social History   Socioeconomic History   Marital status: Married    Spouse name: Wynona Canes   Number of children: 3   Years of education: Not on file   Highest education level: Not on file  Occupational History   Occupation: Retired.  Tobacco Use   Smoking status: Never   Smokeless tobacco: Never  Vaping Use   Vaping status: Never Used  Substance and Sexual Activity   Alcohol use: Yes    Comment: socially   Drug use: No   Sexual activity: Yes  Other  Topics Concern   Not on file  Social History Narrative   Lives with wife.   Social Determinants of Health   Financial Resource Strain: Low Risk  (06/26/2023)   Overall Financial Resource Strain (CARDIA)    Difficulty of Paying Living Expenses: Not very hard  Food Insecurity: No Food Insecurity (06/26/2023)   Hunger Vital Sign    Worried About Running Out of Food in the Last Year: Never true    Ran Out of Food in the Last Year: Never true  Transportation Needs: No Transportation Needs (06/26/2023)   PRAPARE - Administrator, Civil Service (Medical): No    Lack of Transportation (Non-Medical): No  Physical Activity: Inactive (06/26/2023)   Exercise Vital Sign    Days of Exercise per Week: 0 days    Minutes of Exercise per Session: 0 min  Stress: No Stress Concern Present (06/26/2023)   [employer] of Occupational Health - Occupational Stress Questionnaire    Feeling of Stress : Not at all  Social Connections: Unknown (06/26/2023)   Social Connection and Isolation Panel [NHANES]    Frequency of Communication with Friends and Family: Three times a week    Frequency of Social Gatherings with Friends and Family: Once a week    Attends Religious Services: Not on Marketing executive or Organizations: No    Attends Banker Meetings: Never    Marital Status: Married    Tobacco Counseling Counseling given: Not Answered   Clinical Intake:  Pre-visit preparation completed: Yes  Pain : 0-10 Pain Score: 5  Pain Location: Knee Pain Orientation: Right Pain Radiating Towards: leg and knee Pain Onset: More than a month ago Pain Frequency: Intermittent Pain Relieving Factors: Tylenol Effect of Pain on Daily Activities: sit for a while  Pain Relieving Factors: Tylenol  BMI - recorded: 33.97 Nutritional Risks: None Diabetes: No  How often do you need to have someone help you when you read instructions, pamphlets, or other written materials from  your doctor or pharmacy?: 1 - Never  Interpreter Needed?: No  Information entered by :: Ora Bollig, RMA   Activities of Daily Living    06/29/2023   10:30 AM 06/26/2023   10:46 AM  In your present state of health, do you have any difficulty performing the following activities:  Hearing? 0 0  Vision? 0 0  Difficulty concentrating or making decisions? 0 0  Walking or climbing stairs? 1 1  Dressing or bathing? 0 0  Doing errands, shopping? 0 0  Preparing Food and eating ? N N  Using the Toilet? N N  In the past six months, have you accidently leaked urine? N N  Do you have problems with loss of bowel control? N N  Managing your Medications? N N  Managing your Finances? N N  Housekeeping or managing your Housekeeping? N N    Patient Care Team: Myrlene Broker, MD as PCP - General (Internal Medicine) Graylin Shiver, MD as Consulting Physician (Gastroenterology) Karie Soda, MD as Consulting Physician (General Surgery) Chilton Si, MD as Consulting Physician (Cardiology) Colletta Maryland, RN as [facility] Care Management  Indicate any recent Medical Services you may have received from other than [facility] providers in the past year (date may be approximate).     Assessment:   This is a routine wellness examination for [place].  Hearing/Vision screen Hearing Screening - Comments:: Denies hearing difficulties   Vision Screening - Comments:: Wears eyeglasses   Goals Addressed               This Visit's Progress     Patient Stated (pt-stated)        Would love to lose 10 lbs.        Depression Screen    06/29/2023   10:43 AM 07/14/2022    9:26 AM 02/01/2022    1:39 PM 07/09/2021   10:54 AM  PHQ 2/9 Scores  PHQ - 2 Score 0 0 0 0  PHQ- 9 Score 0 0 0     Fall Risk    06/26/2023   10:46 AM 04/04/2023    9:04 AM 01/10/2023  2:37 PM 07/14/2022    9:26 AM  Fall Risk   Falls in the past year? 1 0 0 0  Number falls in past yr: 1 0 0 0   Injury with Fall? 1 0 0 0  Risk for fall due to :  Impaired balance/gait;Impaired mobility Impaired mobility   Follow up Falls evaluation completed;Falls prevention discussed Falls evaluation completed Falls evaluation completed     MEDICARE RISK AT HOME: Medicare Risk at Home Any stairs in or around the home?: No If so, are there any without handrails?: Yes Home free of loose throw rugs in walkways, pet beds, electrical cords, etc?: Yes Adequate lighting in your home to reduce risk of falls?: Yes Life alert?: No Use of a cane, walker or w/c?: Yes Grab bars in the bathroom?: Yes Shower chair or bench in shower?: No Elevated toilet seat or a handicapped toilet?: No  TIMED UP AND GO:  Was the test performed?  No    Cognitive Function:        06/29/2023   10:40 AM  6CIT Screen  What Year? 0 points  What month? 0 points  What time? 0 points  Count back from 20 0 points  Months in reverse 0 points  Repeat phrase 0 points  Total Score 0 points    Immunizations Immunization History  Administered Date(s) Administered   Fluad Quad(high Dose 65+) 07/14/2022   Influenza Split 08/02/2010, 09/18/2011, 06/17/2012, 06/20/2013, 07/03/2016, 07/02/2019, 10/14/2020   Influenza, High Dose Seasonal PF 06/18/2018   Moderna Sars-Covid-2 Vaccination 12/27/2019, 01/29/2020, 10/14/2020, 04/01/2021   Pfizer Covid-19 Vaccine Bivalent Booster 28yrs & up 09/07/2021   Pneumococcal Conjugate-13 12/21/2017   Pneumococcal Polysaccharide-23 04/03/2019   Td 10/03/2006   Tdap 02/04/2009, 04/03/2019   Zoster, Live 06/17/2012, 05/07/2020, 11/03/2020    TDAP status: Up to date  Flu Vaccine status: Due, Education has been provided regarding the importance of this vaccine. Advised may receive this vaccine at local pharmacy or Health Dept. Aware to provide a copy of the vaccination record if obtained from local pharmacy or Health Dept. Verbalized acceptance and understanding.  Pneumococcal vaccine  status: Up to date  Covid-19 vaccine status: Information provided on how to obtain vaccines.   Qualifies for Shingles Vaccine? Yes   Zostavax completed Yes   Shingrix Completed?: Yes  Screening Tests Health Maintenance  Topic Date Due   Colonoscopy  Never done   Zoster Vaccines- Shingrix (1 of 2) 04/22/2002   INFLUENZA VACCINE  05/04/2023   COVID-19 Vaccine (6 - 2023-24 season) 06/04/2023   Medicare Annual Wellness (AWV)  06/28/2024   DTaP/Tdap/Td (4 - Td or Tdap) 04/02/2029   Pneumonia Vaccine 58+ Years old  Completed   Hepatitis C Screening  Completed   HPV VACCINES  Aged Out    Health Maintenance  Health Maintenance Due  Topic Date Due   Colonoscopy  Never done   Zoster Vaccines- Shingrix (1 of 2) 04/22/2002   INFLUENZA VACCINE  05/04/2023   COVID-19 Vaccine (6 - 2023-24 season) 06/04/2023    Colorectal cancer screening: Referral to GI placed 06/29/2023. Pt aware the office will call re: appt.  Lung Cancer Screening: (Low Dose CT Chest recommended if Age 24-80 years, 20 pack-year currently smoking OR have quit w/in 15years.) does not qualify.   Lung Cancer Screening Referral: N/A  Additional Screening:  Hepatitis C Screening: does qualify; Completed 11/17/2022  Vision Screening: Recommended annual ophthalmology exams for early detection of glaucoma and other disorders of the eye. Is  the patient up to date with their annual eye exam?  Yes  Who is the provider or what is the name of the office in which the patient attends annual eye exams? N/A If pt is not established with a provider, would they like to be referred to a provider to establish care? No .   Dental Screening: Recommended annual dental exams for proper oral hygiene   Community Resource Referral / Chronic Care Management: CRR required this visit?  No   CCM required this visit?  No     Plan:     I have personally reviewed and noted the following in the patient's chart:   Medical and social  history Use of alcohol, tobacco or illicit drugs  Current medications and supplements including opioid prescriptions. Patient is not currently taking opioid prescriptions. Functional ability and status Nutritional status Physical activity Advanced directives List of other physicians Hospitalizations, surgeries, and ER visits in previous 12 months Vitals Screenings to include cognitive, depression, and falls Referrals and appointments  In addition, I have reviewed and discussed with patient certain preventive protocols, quality metrics, and best practice recommendations. A written personalized care plan for preventive services as well as general preventive health recommendations were provided to patient.     Clydean Posas L Luccia Reinheimer, CMA   06/29/2023   After Visit Summary: (MyChart) Due to this being a telephonic visit, the after visit summary with patients personalized plan was offered to patient via MyChart   Nurse Notes: Patient is due for a Flu and Covid vaccine.  He is aware that he can get these at his pharmacy.  Patient is also due for a colonoscopy, which an order has been placed today and patient is aware that he has to call and schedule that visit.  Patient stated that he will call office to schedule a visit for CPE with his PCP.  He had no other concerns to address today.

## 2023-06-29 NOTE — Patient Instructions (Addendum)
[name] [name] , Thank you for taking time to come for your Medicare Wellness Visit. I appreciate your ongoing commitment to your health goals. Please review the following plan we discussed and let me know if I can assist you in the future.   Referrals/Orders/Follow-Ups/Clinician Recommendations: You are due for a Flu and Covid vaccine.  Remember to call [facility], [address] [place] [address], [place] [address],  [phone], to schedule a colonoscopy.  It was nice talking to you today and keep up the good work.  This is a list of the screening recommended for you and due dates:  Health Maintenance  Topic Date Due   Colon Cancer Screening  Never done   Zoster (Shingles) Vaccine (1 of 2) 04/22/2002   Flu Shot  05/04/2023   COVID-19 Vaccine (6 - 2023-24 season) 06/04/2023   Medicare Annual Wellness Visit  06/28/2024   DTaP/Tdap/Td vaccine (4 - Td or Tdap) 04/02/2029   Pneumonia Vaccine  Completed   Hepatitis C Screening  Completed   HPV Vaccine  Aged Out    Advanced directives: (Copy Requested) Please bring a copy of your health care power of attorney and living will to the office to be added to your chart at your convenience.  Next Medicare Annual Wellness Visit scheduled for next year: No

## 2023-07-03 DIAGNOSIS — M25661 Stiffness of right knee, not elsewhere classified: Secondary | ICD-10-CM | POA: Diagnosis not present

## 2023-07-03 DIAGNOSIS — M25561 Pain in right knee: Secondary | ICD-10-CM | POA: Diagnosis not present

## 2023-07-06 DIAGNOSIS — M25561 Pain in right knee: Secondary | ICD-10-CM | POA: Diagnosis not present

## 2023-07-06 DIAGNOSIS — M25661 Stiffness of right knee, not elsewhere classified: Secondary | ICD-10-CM | POA: Diagnosis not present

## 2023-07-07 ENCOUNTER — Ambulatory Visit: Payer: Self-pay

## 2023-07-07 NOTE — Patient Instructions (Signed)
Visit Information  Thank you for taking time to visit with me today. Please don't hesitate to contact me if I can be of assistance to you.   Following are the goals we discussed today:  Contact orthopedic provider regarding questions concerns about your knee Continue to take medications as prescribed. Continue to attend provider visits as scheduled Continue to eat healthy, lean meats, vegetables, fruits, avoid saturated and transfats   Our next appointment is by telephone on 07/18/23 at 3:30 pm  Please call the care guide team at [phone] if you need to cancel or reschedule your appointment.   If you are experiencing a Mental Health or Behavioral Health Crisis or need someone to talk to, please call the Suicide and Crisis Lifeline: 988 call the [place] National Suicide Prevention Lifeline: (502) 161-1869 or TTY: 707-656-2111 TTY (919) 703-1834) to talk to a trained counselor call 1-800-273-TALK (toll free, 24 hour hotline)  Kathyrn Sheriff, RN, MSN, BSN, CCM Care Management Coordinator [phone]

## 2023-07-07 NOTE — Patient Outreach (Signed)
Care Coordination   Follow Up Visit Note   07/07/2023 Name: ACY OBARA MRN: [id number] DOB: 07-15-1951  [name] [name] is a 72 y.o. year old male who sees Myrlene Broker, MD for primary care. I spoke with  [name] [name] by phone today.  What matters to the patients health and wellness today?  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 He reports pain level 4-5 (down since last telephone encounter).  But reports still with increased swelling adding he thinks he may be doing too much activity in his yard. He is active with outpatient physical therapy who keeps track of measurement of his knee. Mr. Cramer reports his right knee is reddened with rash as well as itching.  RNCM encouraged patient to contact orthopedic provider for further instructions/direction.   Goals Addressed             This Visit's Progress    COMPLETED: Care Coordination Activities-contniue to improve post hospitalization       Interventions Today    Flowsheet Row Most Recent Value  Chronic Disease   Chronic disease during today's visit Other  General Interventions   General Interventions Discussed/Reviewed General Interventions Reviewed, Doctor Visits  [Evaluation of current treatment plan for health condition (revision of right knee) and patients adherence to plan.]  Doctor Visits Discussed/Reviewed Doctor Visits Reviewed  [reviewed upcoming/scheduled appontment- next office visit with orthopedic provider scheduled for 07/12/23]  Exercise Interventions   Exercise Discussed/Reviewed Exercise Discussed  [assessed patient activty level and exercise plan-confirmed he is active with outpatient physical therapy. advised patient contact provider with concerns about knee swelling and attend upcoming office visit as scheduled]  Education Interventions   Education Provided Provided Education  Provided Verbal Education On Medication, When to see the doctor, Exercise, Other  [advised to contact  orhtopedic provider regarding knee redness/itching today for further direction/treatment of the knee,  take medications as prescribed,  attend outpatient therapy and follow their recommendations as directed.]  Pharmacy Interventions   Pharmacy Dicussed/Reviewed Pharmacy Topics Reviewed  Safety Interventions   Safety Discussed/Reviewed Safety Reviewed           Health management assistance       Interventions Today    Flowsheet Row Most Recent Value  Chronic Disease   Chronic disease during today's visit Other, Hypertension (HTN)  [Revision of Right total knee on 06/01/23]  General Interventions   General Interventions Discussed/Reviewed General Interventions Reviewed, Doctor Visits  [Evaluation of current treatment plan for health condition (revision of right knee) and patients adherence to plan.]  Doctor Visits Discussed/Reviewed Doctor Visits Reviewed  [reviewed upcoming/scheduled appontment- next office visit with orthopedic provider scheduled for 07/12/23]  Exercise Interventions   Exercise Discussed/Reviewed Exercise Discussed  [assessed patient activty level and exercise plan-confirmed he is active with outpatient physical therapy. advised patient contact provider with concerns about knee swelling and attend upcoming office visit as scheduled]  Education Interventions   Education Provided Provided Education  Provided Verbal Education On Medication, When to see the doctor, Exercise, Other  [advised to contact orhtopedic provider regarding knee redness/itching today for further direction/treatment of the knee,  take medications as prescribed,  attend outpatient therapy and follow their recommendations as directed.]  Pharmacy Interventions   Pharmacy Dicussed/Reviewed Pharmacy Topics Reviewed  Safety Interventions   Safety Discussed/Reviewed Safety Reviewed            SDOH assessments and interventions completed:  No  Care Coordination Interventions:  Yes, provided  Follow up  plan: Follow up call scheduled for 07/18/23    Encounter Outcome:  Patient Visit Completed   Kathyrn Sheriff, RN, MSN, BSN, CCM Care Management Coordinator [phone]

## 2023-07-10 DIAGNOSIS — M25561 Pain in right knee: Secondary | ICD-10-CM | POA: Diagnosis not present

## 2023-07-10 DIAGNOSIS — M25661 Stiffness of right knee, not elsewhere classified: Secondary | ICD-10-CM | POA: Diagnosis not present

## 2023-07-12 ENCOUNTER — Other Ambulatory Visit: Payer: Self-pay | Admitting: Internal Medicine

## 2023-07-12 DIAGNOSIS — Z471 Aftercare following joint replacement surgery: Secondary | ICD-10-CM | POA: Diagnosis not present

## 2023-07-12 DIAGNOSIS — Z96651 Presence of right artificial knee joint: Secondary | ICD-10-CM | POA: Diagnosis not present

## 2023-07-17 DIAGNOSIS — M25661 Stiffness of right knee, not elsewhere classified: Secondary | ICD-10-CM | POA: Diagnosis not present

## 2023-07-17 DIAGNOSIS — M25561 Pain in right knee: Secondary | ICD-10-CM | POA: Diagnosis not present

## 2023-07-18 ENCOUNTER — Ambulatory Visit: Payer: Self-pay

## 2023-07-18 ENCOUNTER — Telehealth: Payer: Self-pay

## 2023-07-18 NOTE — Patient Outreach (Signed)
Care Coordination   07/18/2023 Name: GALVIN MCCALLIE MRN: [id number] DOB: [birth year]   Care Coordination Outreach Attempts:  An unsuccessful telephone outreach was attempted for a scheduled appointment today.  Follow Up Plan:  Additional outreach attempts will be made to offer the patient care coordination information and services.   Encounter Outcome:  No Answer   Care Coordination Interventions:  No, not indicated    Kathyrn Sheriff, RN, MSN, BSN, CCM Care Management Coordinator [phone]

## 2023-07-18 NOTE — Patient Instructions (Signed)
Visit Information  Thank you for taking time to visit with me today. Please don't hesitate to contact me if I can be of assistance to you.   Following are the goals we discussed today:  Continue to take medications as prescribed. Continue to attend provider visits as scheduled Continue to eat healthy, lean meats, vegetables, fruits, avoid saturated and transfats Continue therapy sessions as recommended Contact provider with health questions or concerns  Our next appointment is by telephone on 08/22/23 at 3:30 pm  Please call the care guide team at [phone] if you need to cancel or reschedule your appointment.   If you are experiencing a Mental Health or Behavioral Health Crisis or need someone to talk to, please call the Suicide and Crisis Lifeline: 988 call the [place] National Suicide Prevention Lifeline: 2363857791 or TTY: (414)308-3299 TTY 706 766 9514) to talk to a trained counselor call 1-800-273-TALK (toll free, 24 hour hotline)  Kathyrn Sheriff, RN, MSN, BSN, CCM Care Management Coordinator [phone]

## 2023-07-18 NOTE — Patient Outreach (Signed)
Care Coordination   Follow Up Visit Note   07/18/2023 Name: [name] [name] MRN: [id number] DOB: 10/11/1950  [name] [name] is a 72 y.o. year old male who sees Myrlene Broker, MD for primary care. I spoke with  [name] [name] by phone today.  What matters to the patients health and wellness today?  Patient reports he is doing much better, continues with outpatient therapy for right total knee rehab and is able to bend his knee to 80 degrees.  He reports pain level is low and states he is in good spirits. He reports seeing orthopedic provider every two weeks as recommended. He is without any questions or concerns.  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/01/23]  General Interventions   General Interventions Discussed/Reviewed General Interventions Reviewed, Doctor Visits  [Evaluation of current treatment plan for health condition and patient's adherence to plan.]  Doctor Visits Discussed/Reviewed Doctor Visits Reviewed, Specialist  PCP/Specialist Visits Compliance with follow-up visit  [reviewed upcoming scheduled appointments]  Exercise Interventions   Exercise Discussed/Reviewed Exercise Reviewed  [continues with outpatient therapy sessions]  Education Interventions   Education Provided Provided Education  Provided Verbal Education On When to see the doctor, Medication, Other  [encouraged to attend therapy sessions as recommended, attend provider visit as scheduled, take medications as prescribed, contact provider if health questions or concerns]  Pharmacy Interventions   Pharmacy Dicussed/Reviewed Pharmacy Topics Reviewed  Safety Interventions   Safety Discussed/Reviewed Safety Reviewed            SDOH assessments and interventions completed:   No  Care Coordination Interventions:  Yes, provided   Follow up plan: Follow up call scheduled for 08/22/23    Encounter Outcome:  Patient Visit Completed   Kathyrn Sheriff, RN, MSN, BSN, CCM Care Management Coordinator [phone]

## 2023-07-21 ENCOUNTER — Other Ambulatory Visit: Payer: Self-pay | Admitting: Internal Medicine

## 2023-07-26 DIAGNOSIS — M25561 Pain in right knee: Secondary | ICD-10-CM | POA: Diagnosis not present

## 2023-07-28 DIAGNOSIS — M25561 Pain in right knee: Secondary | ICD-10-CM | POA: Diagnosis not present

## 2023-07-28 DIAGNOSIS — M25661 Stiffness of right knee, not elsewhere classified: Secondary | ICD-10-CM | POA: Diagnosis not present

## 2023-08-07 DIAGNOSIS — M25561 Pain in right knee: Secondary | ICD-10-CM | POA: Diagnosis not present

## 2023-08-07 DIAGNOSIS — M25661 Stiffness of right knee, not elsewhere classified: Secondary | ICD-10-CM | POA: Diagnosis not present

## 2023-08-09 ENCOUNTER — Other Ambulatory Visit: Payer: Self-pay | Admitting: Internal Medicine

## 2023-08-09 DIAGNOSIS — Z471 Aftercare following joint replacement surgery: Secondary | ICD-10-CM | POA: Diagnosis not present

## 2023-08-09 DIAGNOSIS — Z96651 Presence of right artificial knee joint: Secondary | ICD-10-CM | POA: Diagnosis not present

## 2023-08-10 ENCOUNTER — Other Ambulatory Visit: Payer: Self-pay | Admitting: Internal Medicine

## 2023-08-10 DIAGNOSIS — M25561 Pain in right knee: Secondary | ICD-10-CM | POA: Diagnosis not present

## 2023-08-10 DIAGNOSIS — M25661 Stiffness of right knee, not elsewhere classified: Secondary | ICD-10-CM | POA: Diagnosis not present

## 2023-08-15 ENCOUNTER — Ambulatory Visit: Payer: Medicare Other | Admitting: Internal Medicine

## 2023-08-15 ENCOUNTER — Encounter: Payer: Self-pay | Admitting: Internal Medicine

## 2023-08-15 VITALS — BP 140/80 | HR 68 | Temp 98.2°F | Ht 69.0 in | Wt 237.0 lb

## 2023-08-15 DIAGNOSIS — Z0000 Encounter for general adult medical examination without abnormal findings: Secondary | ICD-10-CM | POA: Diagnosis not present

## 2023-08-15 DIAGNOSIS — I10 Essential (primary) hypertension: Secondary | ICD-10-CM

## 2023-08-15 DIAGNOSIS — Z0001 Encounter for general adult medical examination with abnormal findings: Secondary | ICD-10-CM

## 2023-08-15 DIAGNOSIS — Z23 Encounter for immunization: Secondary | ICD-10-CM

## 2023-08-15 DIAGNOSIS — D500 Iron deficiency anemia secondary to blood loss (chronic): Secondary | ICD-10-CM | POA: Diagnosis not present

## 2023-08-15 DIAGNOSIS — F3342 Major depressive disorder, recurrent, in full remission: Secondary | ICD-10-CM

## 2023-08-15 DIAGNOSIS — N401 Enlarged prostate with lower urinary tract symptoms: Secondary | ICD-10-CM

## 2023-08-15 DIAGNOSIS — M009 Pyogenic arthritis, unspecified: Secondary | ICD-10-CM

## 2023-08-15 DIAGNOSIS — Z1211 Encounter for screening for malignant neoplasm of colon: Secondary | ICD-10-CM

## 2023-08-15 LAB — COMPREHENSIVE METABOLIC PANEL
ALT: 25 U/L (ref 0–53)
AST: 22 U/L (ref 0–37)
Albumin: 4.3 g/dL (ref 3.5–5.2)
Alkaline Phosphatase: 81 U/L (ref 39–117)
BUN: 19 mg/dL (ref 6–23)
CO2: 26 meq/L (ref 19–32)
Calcium: 9.6 mg/dL (ref 8.4–10.5)
Chloride: 103 meq/L (ref 96–112)
Creatinine, Ser: 0.79 mg/dL (ref 0.40–1.50)
GFR: 89.5 mL/min (ref >60.00)
Glucose, Bld: 105 mg/dL — ABNORMAL HIGH (ref 70–99)
Potassium: 4.4 meq/L (ref 3.5–5.1)
Sodium: 137 meq/L (ref 135–145)
Total Bilirubin: 0.6 mg/dL (ref 0.2–1.2)
Total Protein: 6.1 g/dL (ref 6.0–8.3)

## 2023-08-15 LAB — CBC
HCT: 41 % (ref 39.0–52.0)
Hemoglobin: 13.5 g/dL (ref 13.0–17.0)
MCHC: 33 g/dL (ref 30.0–36.0)
MCV: 87.7 fL (ref 78.0–100.0)
Platelets: 185 10*3/uL (ref 150.0–400.0)
RBC: 4.67 Mil/uL (ref 4.22–5.81)
RDW: 15.9 % — ABNORMAL HIGH (ref 11.5–15.5)
WBC: 3.9 10*3/uL — ABNORMAL LOW (ref 4.0–10.5)

## 2023-08-15 LAB — LIPID PANEL
Cholesterol: 184 mg/dL (ref 0–200)
HDL: 72.3 mg/dL (ref >39.00)
LDL Cholesterol: 100 mg/dL — ABNORMAL HIGH (ref 0–99)
NonHDL: 111.2
Total CHOL/HDL Ratio: 3
Triglycerides: 54 mg/dL (ref 0.0–149.0)
VLDL: 10.8 mg/dL (ref 0.0–40.0)

## 2023-08-15 LAB — FERRITIN: Ferritin: 27.5 ng/mL (ref 22.0–322.0)

## 2023-08-15 LAB — C-REACTIVE PROTEIN: CRP: <1 mg/dL (ref 0.5–20.0)

## 2023-08-15 LAB — VITAMIN B12: Vitamin B-12: 466 pg/mL (ref 211–911)

## 2023-08-15 MED ORDER — SERTRALINE HCL 100 MG PO TABS: 100.0000 mg | ORAL_TABLET | Freq: Every day | ORAL | 3 refills | Status: DC

## 2023-08-15 MED ORDER — ASPIRIN 81 MG PO CHEW: 81.0000 mg | CHEWABLE_TABLET | Freq: Every day | ORAL | 3 refills | Status: DC

## 2023-08-15 MED ORDER — TELMISARTAN 80 MG PO TABS: 80.0000 mg | ORAL_TABLET | Freq: Every day | ORAL | 3 refills | Status: DC

## 2023-08-15 NOTE — Progress Notes (Unsigned)
   Subjective:   Patient ID: [name] [name], male    DOB: Feb 09, 1951, 72 y.o.   MRN: [id number]  HPI The patient is here for physical.  PMH, [facility], social history reviewed and updated  Review of Systems  Objective:  Physical Exam  Vitals:   08/15/23 0859 08/15/23 0905  BP: (!) 140/80 (!) 140/80  Pulse: 68   Temp: 98.2 F (36.8 C)   TempSrc: Oral   SpO2: 98%   Weight: 237 lb (107.5 kg)   Height: 5\' 9"  (1.753 m)     Assessment & Plan:

## 2023-08-16 DIAGNOSIS — D500 Iron deficiency anemia secondary to blood loss (chronic): Secondary | ICD-10-CM | POA: Insufficient documentation

## 2023-08-16 NOTE — Assessment & Plan Note (Signed)
Taking flomax and symptoms stable. Will continue.

## 2023-08-16 NOTE — Assessment & Plan Note (Signed)
Flu shot given. Pneumonia complete. Shingrix due at pharmacy. Tetanus up to date. Colonoscopy due referral placed to [facility] where he goes. Counseled about sun safety and mole surveillance. Counseled about the dangers of distracted driving. Given 10 year screening recommendations.

## 2023-08-16 NOTE — Assessment & Plan Note (Signed)
Checking CRP to assess activitiy. He does have recurrent effusion right knee without significant pain and walking without mobility device.

## 2023-08-16 NOTE — Assessment & Plan Note (Signed)
BP at goal on telmisartan 80 mg daily. Checking CMP and adjust as needed.

## 2023-08-16 NOTE — Assessment & Plan Note (Signed)
Checking CBC, B12, ferritin. This was from several surgeries on his knee this year. Adjust as needed.

## 2023-08-16 NOTE — Assessment & Plan Note (Signed)
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

## 2023-08-22 ENCOUNTER — Telehealth: Payer: Self-pay

## 2023-08-22 NOTE — Patient Outreach (Signed)
  Care Coordination   08/22/2023 Name: [name] [name] MRN: [id number] DOB: 07-Jul-1951   Care Coordination Outreach Attempts:  An unsuccessful telephone outreach was attempted for a scheduled appointment today.  Follow Up Plan:  Additional outreach attempts will be made to offer the patient care coordination information and services.   Encounter Outcome:  No Answer   Care Coordination Interventions:  No, not indicated    Kathyrn Sheriff, RN, MSN, BSN, CCM Care Management Coordinator [phone]

## 2023-08-24 DIAGNOSIS — Z96651 Presence of right artificial knee joint: Secondary | ICD-10-CM | POA: Diagnosis not present

## 2023-09-06 DIAGNOSIS — Z471 Aftercare following joint replacement surgery: Secondary | ICD-10-CM | POA: Diagnosis not present

## 2023-09-06 DIAGNOSIS — Z96651 Presence of right artificial knee joint: Secondary | ICD-10-CM | POA: Diagnosis not present

## 2023-09-07 DIAGNOSIS — M25661 Stiffness of right knee, not elsewhere classified: Secondary | ICD-10-CM | POA: Diagnosis not present

## 2023-09-07 DIAGNOSIS — M25561 Pain in right knee: Secondary | ICD-10-CM | POA: Diagnosis not present

## 2023-09-13 ENCOUNTER — Telehealth: Payer: Self-pay | Admitting: *Deleted

## 2023-09-13 DIAGNOSIS — L570 Actinic keratosis: Secondary | ICD-10-CM | POA: Diagnosis not present

## 2023-09-13 DIAGNOSIS — Z85828 Personal history of other malignant neoplasm of skin: Secondary | ICD-10-CM | POA: Diagnosis not present

## 2023-09-13 DIAGNOSIS — L814 Other melanin hyperpigmentation: Secondary | ICD-10-CM | POA: Diagnosis not present

## 2023-09-13 DIAGNOSIS — D1801 Hemangioma of skin and subcutaneous tissue: Secondary | ICD-10-CM | POA: Diagnosis not present

## 2023-09-13 DIAGNOSIS — D225 Melanocytic nevi of trunk: Secondary | ICD-10-CM | POA: Diagnosis not present

## 2023-09-13 DIAGNOSIS — D171 Benign lipomatous neoplasm of skin and subcutaneous tissue of trunk: Secondary | ICD-10-CM | POA: Diagnosis not present

## 2023-09-13 DIAGNOSIS — D2262 Melanocytic nevi of left upper limb, including shoulder: Secondary | ICD-10-CM | POA: Diagnosis not present

## 2023-09-13 DIAGNOSIS — D2261 Melanocytic nevi of right upper limb, including shoulder: Secondary | ICD-10-CM | POA: Diagnosis not present

## 2023-09-13 DIAGNOSIS — L821 Other seborrheic keratosis: Secondary | ICD-10-CM | POA: Diagnosis not present

## 2023-09-13 NOTE — Progress Notes (Signed)
  Care Coordination Note  09/13/2023 Name: [name] [name] MRN: [id number] DOB: November 24, 1950  [name] [name] is a 72 y.o. year old male who is a primary care patient of Myrlene Broker, MD and is actively engaged with the care management team. I reached out to [name] [name] by phone today to assist with re-scheduling a follow up visit with the RN Case Manager  Follow up plan: Pt returned call - says he is doing well, trying to go back to work and declined to reschedule at this time - very appreciative of f/u   Burman Nieves, [facility] Care Coordination Care Guide Direct Dial: [phone]

## 2023-09-13 NOTE — Patient Outreach (Signed)
  Care Coordination   Case closure  Visit Note   09/13/2023 Name: [name] [name] MRN: [id number] DOB: 12-20-1950  [name] [name] is a 71 y.o. year old male who sees Myrlene Broker, MD for primary care. No patient contact was made during this encounter.  Per care guide, patient says, "he is doing well, trying to go back to work and declined to reschedule at this time - very appreciative of follow up". Case Closed.   Goals Addressed             This Visit's Progress    COMPLETED: Health management assistance       Interventions Today    Flowsheet Row Most Recent Value  General Interventions   General Interventions Discussed/Reviewed General Interventions Reviewed  [declines further follow up-case closed]            SDOH assessments and interventions completed:  No  Care Coordination Interventions:  No, not indicated   Follow up plan: No further intervention required.   Encounter Outcome:  Patient Visit Completed   Kathyrn Sheriff, RN, MSN, BSN, CCM Care Management Coordinator [phone]

## 2023-09-13 NOTE — Progress Notes (Signed)
  Care Coordination Note  09/13/2023 Name: [name] [name] MRN: [id number] DOB: 07-08-1951  [name] [name] is a 72 y.o. year old male who is a primary care patient of Myrlene Broker, MD and is actively engaged with the care management team. I reached out to [name] by phone today to assist with re-scheduling a follow up visit with the RN Case Manager  Follow up plan: Unsuccessful telephone outreach attempt made. A HIPAA compliant phone message was left for the patient providing contact information and requesting a return call.   Burman Nieves, CCMA Care Coordination Care Guide Direct Dial: [phone]

## 2023-09-20 DIAGNOSIS — M25561 Pain in right knee: Secondary | ICD-10-CM | POA: Diagnosis not present

## 2023-09-28 DIAGNOSIS — Z96651 Presence of right artificial knee joint: Secondary | ICD-10-CM | POA: Diagnosis not present

## 2023-09-28 DIAGNOSIS — Z471 Aftercare following joint replacement surgery: Secondary | ICD-10-CM | POA: Diagnosis not present

## 2023-09-28 DIAGNOSIS — T8189XD Other complications of procedures, not elsewhere classified, subsequent encounter: Secondary | ICD-10-CM | POA: Diagnosis not present

## 2023-09-29 NOTE — Progress Notes (Signed)
COVID Vaccine Completed:  Yes  Date of COVID positive in last 90 days:  PCP - Hillard Danker, MD Cardiologist - Chilton Si, MD (for HTN)  Chest x-ray -  EKG -  Stress Test -  ECHO -  Cardiac Cath -  Pacemaker/ICD device last checked: Spinal Cord Stimulator:  Bowel Prep -   Sleep Study -  CPAP -   Fasting Blood Sugar -  Checks Blood Sugar _____ times a day  Last dose of GLP1 agonist-  N/A GLP1 instructions:  Hold 7 days before surgery    Last dose of SGLT-2 inhibitors-  N/A SGLT-2 instructions:  Hold 3 days before surgery    Blood Thinner Instructions:  Time Aspirin Instructions: Last Dose:  Activity level:  Can go up a flight of stairs and perform activities of daily living without stopping and without symptoms of chest pain or shortness of breath.  Able to exercise without symptoms  Unable to go up a flight of stairs without symptoms of     Anesthesia review:   Patient denies shortness of breath, fever, cough and chest pain at PAT appointment  Patient verbalized understanding of instructions that were given to them at the PAT appointment. Patient was also instructed that they will need to review over the PAT instructions again at home before surgery.

## 2023-09-29 NOTE — Progress Notes (Signed)
Surgery orders requested via Epic inbox. °

## 2023-09-29 NOTE — Patient Instructions (Signed)
SURGICAL WAITING ROOM VISITATION Patients having surgery or a procedure may have no more than 2 support people in the waiting area - these visitors may rotate.    Children under the age of 33 must have an adult with them who is not the patient.  If the patient needs to stay at the hospital during part of their recovery, the visitor guidelines for inpatient rooms apply. Pre-op nurse will coordinate an appropriate time for 1 support person to accompany patient in pre-op.  This support person may not rotate.    Please refer to the [facility] website for the visitor guidelines for Inpatients (after your surgery is over and you are in a regular room).       Your procedure is scheduled on: 10-03-23   Report to [facility] Main Entrance    Report to admitting at 1:50 PM   Call this number if you have problems the morning of surgery [phone]   Do not eat food :After Midnight.   After Midnight you may have the following liquids until 1:00 PM DAY OF SURGERY  Water Non-Citrus Juices (without pulp, NO RED-Apple, White grape, White cranberry) Black Coffee (NO MILK/CREAM OR CREAMERS, sugar ok)  Clear Tea (NO MILK/CREAM OR CREAMERS, sugar ok) regular and decaf                             Plain Jell-O (NO RED)                                           Fruit ices (not with fruit pulp, NO RED)                                     Popsicles (NO RED)                                                               Sports drinks like Gatorade (NO RED)                   The day of surgery:  Drink ONE (1) Pre-Surgery Clear Ensure by 1:00 PM the morning of surgery. Drink in one sitting. Do not sip.  This drink was given to you during your hospital  pre-op appointment visit. Nothing else to drink after completing the Pre-Surgery Clear Ensure          If you have questions, please contact your surgeon's office.   FOLLOW  ANY ADDITIONAL PRE OP INSTRUCTIONS YOU RECEIVED FROM YOUR SURGEON'S  OFFICE!!!     Oral Hygiene is also important to reduce your risk of infection.                                    Remember - BRUSH YOUR TEETH THE MORNING OF SURGERY WITH YOUR REGULAR TOOTHPASTE   Do NOT smoke after Midnight   Take these medicines the morning of surgery with A SIP OF WATER:   Cefadroxil  Sertraline  Stop  all vitamins and herbal supplements 7 days before surgery  Bring CPAP mask and tubing day of surgery.                              You may not have any metal on your body including  jewelry, and body piercing             Do not wear lotions, powders, cologne, or deodorant              Men may shave face and neck.   Do not bring valuables to the hospital. [facility] IS NOT RESPONSIBLE   FOR VALUABLES.   Contacts, dentures or bridgework may not be worn into surgery.   Bring small overnight bag day of surgery.   DO NOT BRING YOUR HOME MEDICATIONS TO THE HOSPITAL. PHARMACY WILL DISPENSE MEDICATIONS LISTED ON YOUR MEDICATION LIST TO YOU DURING YOUR ADMISSION IN THE HOSPITAL!   Special Instructions: Bring a copy of your healthcare power of attorney and living will documents the day of surgery if you haven't scanned them before.              Please read over the following fact sheets you were given: IF YOU HAVE QUESTIONS ABOUT YOUR PRE-OP INSTRUCTIONS PLEASE CALL [phone] [name] [name]  If you received a COVID test during your pre-op visit  it is requested that you wear a mask when out in public, stay away from anyone that may not be feeling well and notify your surgeon if you develop symptoms. If you test positive for Covid or have been in contact with anyone that has tested positive in the last 10 days please notify you surgeon.  [name] [name] - Preparing for Surgery Before surgery, you can play an important role.  Because skin is not sterile, your skin needs to be as free of germs as possible.  You can reduce the number of germs on your skin by washing with CHG  (chlorahexidine gluconate) soap before surgery.  CHG is an antiseptic cleaner which kills germs and bonds with the skin to continue killing germs even after washing. Please DO NOT use if you have an allergy to CHG or antibacterial soaps.  If your skin becomes reddened/irritated stop using the CHG and inform your nurse when you arrive at Short Stay. Do not shave (including legs and underarms) for at least 48 hours prior to the first CHG shower.  You may shave your face/neck.  Please follow these instructions carefully:  1.  Shower with CHG Soap the night before surgery and the  morning of surgery.  2.  If you choose to wash your hair, wash your hair first as usual with your normal  shampoo.  3.  After you shampoo, rinse your hair and body thoroughly to remove the shampoo.                             4.  Use CHG as you would any other liquid soap.  You can apply chg directly to the skin and wash.  Gently with a scrungie or clean washcloth.  5.  Apply the CHG Soap to your body ONLY FROM THE NECK DOWN.   Do   not use on face/ open                           Wound  or open sores. Avoid contact with eyes, ears mouth and   genitals (private parts).                       Wash face,  Genitals (private parts) with your normal soap.             6.  Wash thoroughly, paying special attention to the area where your    surgery  will be performed.  7.  Thoroughly rinse your body with warm water from the neck down.  8.  DO NOT shower/wash with your normal soap after using and rinsing off the CHG Soap.                9.  Pat yourself dry with a clean towel.            10.  Wear clean pajamas.            11.  Place clean sheets on your bed the night of your first shower and do not  sleep with pets. Day of Surgery : Do not apply any lotions/deodorants the morning of surgery.  Please wear clean clothes to the hospital/surgery center.  FAILURE TO FOLLOW THESE INSTRUCTIONS MAY RESULT IN THE CANCELLATION OF YOUR  SURGERY  PATIENT SIGNATURE_________________________________  NURSE SIGNATURE__________________________________  ________________________________________________________________________

## 2023-10-02 ENCOUNTER — Other Ambulatory Visit: Payer: Self-pay

## 2023-10-02 ENCOUNTER — Encounter (HOSPITAL_COMMUNITY): Payer: Self-pay

## 2023-10-02 ENCOUNTER — Encounter (HOSPITAL_COMMUNITY)
Admission: RE | Admit: 2023-10-02 | Discharge: 2023-10-02 | Disposition: A | Discharge status: Home / Self Care | Payer: Medicare Other | Source: Ambulatory Visit | Attending: Orthopedic Surgery | Admitting: Orthopedic Surgery

## 2023-10-02 VITALS — BP 140/81 | HR 61 | Temp 98.2°F | Resp 16 | Ht 69.0 in | Wt 235.6 lb

## 2023-10-02 DIAGNOSIS — Z8249 Family history of ischemic heart disease and other diseases of the circulatory system: Secondary | ICD-10-CM | POA: Diagnosis not present

## 2023-10-02 DIAGNOSIS — Z85828 Personal history of other malignant neoplasm of skin: Secondary | ICD-10-CM | POA: Diagnosis not present

## 2023-10-02 DIAGNOSIS — I10 Essential (primary) hypertension: Secondary | ICD-10-CM | POA: Insufficient documentation

## 2023-10-02 DIAGNOSIS — L7634 Postprocedural seroma of skin and subcutaneous tissue following other procedure: Secondary | ICD-10-CM | POA: Diagnosis not present

## 2023-10-02 DIAGNOSIS — Z01818 Encounter for other preprocedural examination: Secondary | ICD-10-CM | POA: Insufficient documentation

## 2023-10-02 DIAGNOSIS — Z9049 Acquired absence of other specified parts of digestive tract: Secondary | ICD-10-CM | POA: Diagnosis not present

## 2023-10-02 DIAGNOSIS — Z79899 Other long term (current) drug therapy: Secondary | ICD-10-CM | POA: Diagnosis not present

## 2023-10-02 DIAGNOSIS — Z825 Family history of asthma and other chronic lower respiratory diseases: Secondary | ICD-10-CM | POA: Diagnosis not present

## 2023-10-02 DIAGNOSIS — K219 Gastro-esophageal reflux disease without esophagitis: Secondary | ICD-10-CM | POA: Diagnosis not present

## 2023-10-02 DIAGNOSIS — N400 Enlarged prostate without lower urinary tract symptoms: Secondary | ICD-10-CM | POA: Diagnosis not present

## 2023-10-02 DIAGNOSIS — Z888 Allergy status to other drugs, medicaments and biological substances status: Secondary | ICD-10-CM | POA: Diagnosis not present

## 2023-10-02 DIAGNOSIS — M25561 Pain in right knee: Secondary | ICD-10-CM | POA: Diagnosis not present

## 2023-10-02 DIAGNOSIS — Z96651 Presence of right artificial knee joint: Secondary | ICD-10-CM | POA: Diagnosis not present

## 2023-10-02 LAB — BASIC METABOLIC PANEL
Anion gap: 8 (ref 5–15)
BUN: 18 mg/dL (ref 8–23)
CO2: 26 mmol/L (ref 22–32)
Calcium: 9.3 mg/dL (ref 8.9–10.3)
Chloride: 103 mmol/L (ref 98–111)
Creatinine, Ser: 0.74 mg/dL (ref 0.61–1.24)
GFR, Estimated: >60 mL/min (ref >60)
Glucose, Bld: 104 mg/dL — ABNORMAL HIGH (ref 70–99)
Potassium: 4.1 mmol/L (ref 3.5–5.1)
Sodium: 137 mmol/L (ref 135–145)

## 2023-10-03 ENCOUNTER — Inpatient Hospital Stay (HOSPITAL_COMMUNITY): Payer: Medicare Other

## 2023-10-03 ENCOUNTER — Other Ambulatory Visit: Payer: Self-pay

## 2023-10-03 ENCOUNTER — Encounter (HOSPITAL_COMMUNITY)
Admission: RE | Disposition: A | Discharge status: Home / Self Care | Payer: Self-pay | Source: Home / Self Care | Attending: Orthopedic Surgery

## 2023-10-03 ENCOUNTER — Inpatient Hospital Stay (HOSPITAL_COMMUNITY)
Admission: RE | Admit: 2023-10-03 | Discharge: 2023-10-05 | DRG: 903 | Disposition: A | Discharge status: Home / Self Care | Payer: Medicare Other | Attending: Orthopedic Surgery | Admitting: Orthopedic Surgery

## 2023-10-03 ENCOUNTER — Encounter (HOSPITAL_COMMUNITY): Payer: Self-pay | Admitting: Orthopedic Surgery

## 2023-10-03 DIAGNOSIS — M96842 Postprocedural seroma of a musculoskeletal structure following a musculoskeletal system procedure: Secondary | ICD-10-CM

## 2023-10-03 DIAGNOSIS — Z96651 Presence of right artificial knee joint: Secondary | ICD-10-CM | POA: Diagnosis present

## 2023-10-03 DIAGNOSIS — S81001A Unspecified open wound, right knee, initial encounter: Secondary | ICD-10-CM | POA: Diagnosis not present

## 2023-10-03 DIAGNOSIS — Z825 Family history of asthma and other chronic lower respiratory diseases: Secondary | ICD-10-CM | POA: Diagnosis not present

## 2023-10-03 DIAGNOSIS — Z9049 Acquired absence of other specified parts of digestive tract: Secondary | ICD-10-CM | POA: Diagnosis not present

## 2023-10-03 DIAGNOSIS — Y838 Other surgical procedures as the cause of abnormal reaction of the patient, or of later complication, without mention of misadventure at the time of the procedure: Secondary | ICD-10-CM | POA: Diagnosis present

## 2023-10-03 DIAGNOSIS — I10 Essential (primary) hypertension: Principal | ICD-10-CM | POA: Diagnosis present

## 2023-10-03 DIAGNOSIS — Z01818 Encounter for other preprocedural examination: Secondary | ICD-10-CM | POA: Diagnosis not present

## 2023-10-03 DIAGNOSIS — M25561 Pain in right knee: Secondary | ICD-10-CM | POA: Diagnosis present

## 2023-10-03 DIAGNOSIS — Z79899 Other long term (current) drug therapy: Secondary | ICD-10-CM

## 2023-10-03 DIAGNOSIS — Z888 Allergy status to other drugs, medicaments and biological substances status: Secondary | ICD-10-CM | POA: Diagnosis not present

## 2023-10-03 DIAGNOSIS — Z8249 Family history of ischemic heart disease and other diseases of the circulatory system: Secondary | ICD-10-CM

## 2023-10-03 DIAGNOSIS — K219 Gastro-esophageal reflux disease without esophagitis: Secondary | ICD-10-CM | POA: Diagnosis present

## 2023-10-03 DIAGNOSIS — Z85828 Personal history of other malignant neoplasm of skin: Secondary | ICD-10-CM | POA: Diagnosis not present

## 2023-10-03 DIAGNOSIS — N400 Enlarged prostate without lower urinary tract symptoms: Secondary | ICD-10-CM | POA: Diagnosis present

## 2023-10-03 DIAGNOSIS — L7634 Postprocedural seroma of skin and subcutaneous tissue following other procedure: Principal | ICD-10-CM | POA: Diagnosis present

## 2023-10-03 DIAGNOSIS — Z471 Aftercare following joint replacement surgery: Secondary | ICD-10-CM | POA: Diagnosis not present

## 2023-10-03 DIAGNOSIS — T8189XA Other complications of procedures, not elsewhere classified, initial encounter: Secondary | ICD-10-CM | POA: Diagnosis not present

## 2023-10-03 HISTORY — PX: IRRIGATION AND DEBRIDEMENT KNEE: SHX5185

## 2023-10-03 HISTORY — PX: APPLICATION OF WOUND VAC: SHX5189

## 2023-10-03 LAB — CBC
HCT: 37.9 % — ABNORMAL LOW (ref 39.0–52.0)
Hemoglobin: 12.3 g/dL — ABNORMAL LOW (ref 13.0–17.0)
MCH: 28.4 pg (ref 26.0–34.0)
MCHC: 32.5 g/dL (ref 30.0–36.0)
MCV: 87.5 fL (ref 80.0–100.0)
Platelets: 151 10*3/uL (ref 150–400)
RBC: 4.33 MIL/uL (ref 4.22–5.81)
RDW: 16.4 % — ABNORMAL HIGH (ref 11.5–15.5)
WBC: 5.6 10*3/uL (ref 4.0–10.5)
nRBC: 0 % (ref 0.0–0.2)

## 2023-10-03 SURGERY — IRRIGATION AND DEBRIDEMENT KNEE
Anesthesia: General | Site: Knee | Laterality: Right

## 2023-10-03 MED ORDER — CHLORHEXIDINE GLUCONATE 0.12 % MT SOLN
15.0000 mL | Freq: Once | OROMUCOSAL | Status: AC
  Administered 2023-10-03: 15 mL via OROMUCOSAL

## 2023-10-03 MED ORDER — EPHEDRINE 5 MG/ML INJ
INTRAVENOUS | Status: AC
  Filled 2023-10-03: qty 5

## 2023-10-03 MED ORDER — DROPERIDOL 2.5 MG/ML IJ SOLN
0.6250 mg | Freq: Once | INTRAMUSCULAR | Status: DC | PRN
Start: 2023-10-03 — End: 2023-10-03

## 2023-10-03 MED ORDER — SERTRALINE HCL 100 MG PO TABS
100.0000 mg | ORAL_TABLET | Freq: Every day | ORAL | Status: DC
  Administered 2023-10-04 – 2023-10-05 (×2): 100 mg via ORAL
  Filled 2023-10-03 (×2): qty 1

## 2023-10-03 MED ORDER — METOCLOPRAMIDE HCL 5 MG/ML IJ SOLN: 5.0000 mg | Freq: Three times a day (TID) | INTRAMUSCULAR | Status: DC | PRN

## 2023-10-03 MED ORDER — ACETAMINOPHEN 10 MG/ML IV SOLN: 1000.0000 mg | Freq: Once | INTRAVENOUS | Status: DC | PRN

## 2023-10-03 MED ORDER — DEXAMETHASONE SODIUM PHOSPHATE 10 MG/ML IJ SOLN: 8.0000 mg | Freq: Once | INTRAMUSCULAR | Status: DC

## 2023-10-03 MED ORDER — MUPIROCIN 2 % EX OINT: 1.0000 | TOPICAL_OINTMENT | Freq: Two times a day (BID) | CUTANEOUS | 0 refills | Status: AC

## 2023-10-03 MED ORDER — METHOCARBAMOL 1000 MG/10ML IJ SOLN: 500.0000 mg | Freq: Four times a day (QID) | INTRAMUSCULAR | Status: DC | PRN

## 2023-10-03 MED ORDER — LACTATED RINGERS IV SOLN: INTRAVENOUS | Status: DC

## 2023-10-03 MED ORDER — ONDANSETRON HCL 4 MG/2ML IJ SOLN
INTRAMUSCULAR | Status: AC
Start: 2023-10-03 — End: ?
  Filled 2023-10-03: qty 2

## 2023-10-03 MED ORDER — DIPHENHYDRAMINE HCL 12.5 MG/5ML PO ELIX: 12.5000 mg | ORAL_SOLUTION | ORAL | Status: DC | PRN

## 2023-10-03 MED ORDER — FENTANYL CITRATE (PF) 100 MCG/2ML IJ SOLN
INTRAMUSCULAR | Status: AC
  Filled 2023-10-03: qty 2

## 2023-10-03 MED ORDER — VANCOMYCIN HCL 1000 MG IV SOLR
INTRAVENOUS | Status: AC
Start: 2023-10-03 — End: ?
  Filled 2023-10-03: qty 20

## 2023-10-03 MED ORDER — ALBUMIN HUMAN 5 % IV SOLN
INTRAVENOUS | Status: AC
Start: 2023-10-03 — End: ?
  Filled 2023-10-03: qty 250

## 2023-10-03 MED ORDER — SENNA 8.6 MG PO TABS
2.0000 | ORAL_TABLET | Freq: Every day | ORAL | Status: DC
  Administered 2023-10-03 – 2023-10-04 (×2): 17.2 mg via ORAL
  Filled 2023-10-03 (×2): qty 2

## 2023-10-03 MED ORDER — DEXAMETHASONE SODIUM PHOSPHATE 10 MG/ML IJ SOLN
INTRAMUSCULAR | Status: DC | PRN
  Administered 2023-10-03: 10 mg via INTRAVENOUS

## 2023-10-03 MED ORDER — BISACODYL 10 MG RE SUPP: 10.0000 mg | Freq: Every day | RECTAL | Status: DC | PRN

## 2023-10-03 MED ORDER — ONDANSETRON HCL 4 MG PO TABS
4.0000 mg | ORAL_TABLET | Freq: Four times a day (QID) | ORAL | Status: DC | PRN
Start: 2023-10-03 — End: 2023-10-05

## 2023-10-03 MED ORDER — CHLORHEXIDINE GLUCONATE 0.12 % MT SOLN: 15.0000 mL | Freq: Once | OROMUCOSAL | Status: DC

## 2023-10-03 MED ORDER — SODIUM CHLORIDE 0.9% FLUSH: 3.0000 mL | Freq: Two times a day (BID) | INTRAVENOUS | Status: DC

## 2023-10-03 MED ORDER — ASPIRIN 81 MG PO CHEW
81.0000 mg | CHEWABLE_TABLET | Freq: Two times a day (BID) | ORAL | Status: DC
  Administered 2023-10-03 – 2023-10-04 (×3): 81 mg via ORAL
  Filled 2023-10-03 (×3): qty 1

## 2023-10-03 MED ORDER — TRANEXAMIC ACID 1000 MG/10ML IV SOLN
2000.0000 mg | Freq: Once | INTRAVENOUS | Status: AC
  Administered 2023-10-03: 2000 mg via TOPICAL
  Filled 2023-10-03: qty 20

## 2023-10-03 MED ORDER — ALUM & MAG HYDROXIDE-SIMETH 200-200-20 MG/5ML PO SUSP: 30.0000 mL | ORAL | Status: DC | PRN

## 2023-10-03 MED ORDER — LIDOCAINE HCL (PF) 2 % IJ SOLN
INTRAMUSCULAR | Status: AC
  Filled 2023-10-03: qty 5

## 2023-10-03 MED ORDER — PROPOFOL 10 MG/ML IV BOLUS
INTRAVENOUS | Status: DC | PRN
  Administered 2023-10-03: 180 mg via INTRAVENOUS

## 2023-10-03 MED ORDER — OXYCODONE HCL 5 MG PO TABS: 10.0000 mg | ORAL_TABLET | ORAL | Status: DC | PRN

## 2023-10-03 MED ORDER — CEFAZOLIN SODIUM-DEXTROSE 2-4 GM/100ML-% IV SOLN
2.0000 g | INTRAVENOUS | Status: AC
  Administered 2023-10-03: 2 g via INTRAVENOUS
  Filled 2023-10-03: qty 100

## 2023-10-03 MED ORDER — ORAL CARE MOUTH RINSE: 15.0000 mL | Freq: Once | OROMUCOSAL | Status: DC

## 2023-10-03 MED ORDER — TAMSULOSIN HCL 0.4 MG PO CAPS
0.4000 mg | ORAL_CAPSULE | Freq: Every day | ORAL | Status: DC
  Administered 2023-10-03 – 2023-10-04 (×2): 0.4 mg via ORAL
  Filled 2023-10-03 (×2): qty 1

## 2023-10-03 MED ORDER — HYDROMORPHONE HCL 1 MG/ML IJ SOLN
0.2500 mg | INTRAMUSCULAR | Status: DC | PRN
Start: 2023-10-03 — End: 2023-10-03

## 2023-10-03 MED ORDER — GABAPENTIN 300 MG PO CAPS
300.0000 mg | ORAL_CAPSULE | Freq: Two times a day (BID) | ORAL | Status: DC
  Administered 2023-10-03 – 2023-10-05 (×4): 300 mg via ORAL
  Filled 2023-10-03 (×4): qty 1

## 2023-10-03 MED ORDER — ALBUMIN HUMAN 5 % IV SOLN: INTRAVENOUS | Status: DC | PRN

## 2023-10-03 MED ORDER — IRBESARTAN 150 MG PO TABS
300.0000 mg | ORAL_TABLET | Freq: Every day | ORAL | Status: DC
  Administered 2023-10-04 – 2023-10-05 (×2): 300 mg via ORAL
  Filled 2023-10-03 (×2): qty 2

## 2023-10-03 MED ORDER — PHENYLEPHRINE 80 MCG/ML (10ML) SYRINGE FOR IV PUSH (FOR BLOOD PRESSURE SUPPORT)
PREFILLED_SYRINGE | INTRAVENOUS | Status: DC | PRN
  Administered 2023-10-03 (×4): 160 ug via INTRAVENOUS
  Administered 2023-10-03 (×2): 80 ug via INTRAVENOUS

## 2023-10-03 MED ORDER — PHENYLEPHRINE 80 MCG/ML (10ML) SYRINGE FOR IV PUSH (FOR BLOOD PRESSURE SUPPORT)
PREFILLED_SYRINGE | INTRAVENOUS | Status: AC
  Filled 2023-10-03: qty 10

## 2023-10-03 MED ORDER — FENTANYL CITRATE (PF) 100 MCG/2ML IJ SOLN
INTRAMUSCULAR | Status: DC | PRN
  Administered 2023-10-03: 50 ug via INTRAVENOUS
  Administered 2023-10-03 (×2): 25 ug via INTRAVENOUS

## 2023-10-03 MED ORDER — SODIUM CHLORIDE 0.9% FLUSH: 3.0000 mL | INTRAVENOUS | Status: DC | PRN

## 2023-10-03 MED ORDER — POLYETHYLENE GLYCOL 3350 17 G PO PACK
17.0000 g | PACK | Freq: Two times a day (BID) | ORAL | Status: DC
  Administered 2023-10-03 – 2023-10-05 (×4): 17 g via ORAL
  Filled 2023-10-03 (×4): qty 1

## 2023-10-03 MED ORDER — POVIDONE-IODINE 10 % EX SWAB
2.0000 | Freq: Once | CUTANEOUS | Status: AC
  Administered 2023-10-03: 2 via TOPICAL

## 2023-10-03 MED ORDER — METOCLOPRAMIDE HCL 5 MG PO TABS: 5.0000 mg | ORAL_TABLET | Freq: Three times a day (TID) | ORAL | Status: DC | PRN

## 2023-10-03 MED ORDER — MENTHOL 3 MG MT LOZG: 1.0000 | LOZENGE | OROMUCOSAL | Status: DC | PRN

## 2023-10-03 MED ORDER — ACETAMINOPHEN 325 MG PO TABS
325.0000 mg | ORAL_TABLET | Freq: Once | ORAL | Status: DC | PRN
Start: 2023-10-03 — End: 2023-10-03

## 2023-10-03 MED ORDER — ONDANSETRON HCL 4 MG/2ML IJ SOLN: 4.0000 mg | Freq: Four times a day (QID) | INTRAMUSCULAR | Status: DC | PRN

## 2023-10-03 MED ORDER — DEXAMETHASONE SODIUM PHOSPHATE 10 MG/ML IJ SOLN
10.0000 mg | Freq: Once | INTRAMUSCULAR | Status: AC
  Administered 2023-10-04: 10 mg via INTRAVENOUS
  Filled 2023-10-03: qty 1

## 2023-10-03 MED ORDER — OXYCODONE HCL 5 MG PO TABS
5.0000 mg | ORAL_TABLET | ORAL | Status: DC | PRN
  Administered 2023-10-03: 10 mg via ORAL
  Administered 2023-10-04: 5 mg via ORAL
  Filled 2023-10-03: qty 2
  Filled 2023-10-03: qty 1

## 2023-10-03 MED ORDER — BUPIVACAINE HCL (PF) 0.5 % IJ SOLN
INTRAMUSCULAR | Status: AC
  Filled 2023-10-03: qty 30

## 2023-10-03 MED ORDER — METHOCARBAMOL 500 MG PO TABS: 500.0000 mg | ORAL_TABLET | Freq: Four times a day (QID) | ORAL | Status: DC | PRN

## 2023-10-03 MED ORDER — MEPERIDINE HCL 50 MG/ML IJ SOLN: 6.2500 mg | INTRAMUSCULAR | Status: DC | PRN

## 2023-10-03 MED ORDER — EPHEDRINE SULFATE-NACL 50-0.9 MG/10ML-% IV SOSY
PREFILLED_SYRINGE | INTRAVENOUS | Status: DC | PRN
  Administered 2023-10-03: 5 mg via INTRAVENOUS
  Administered 2023-10-03: 10 mg via INTRAVENOUS
  Administered 2023-10-03 (×2): 5 mg via INTRAVENOUS

## 2023-10-03 MED ORDER — TRANEXAMIC ACID-NACL 1000-0.7 MG/100ML-% IV SOLN
1000.0000 mg | Freq: Once | INTRAVENOUS | Status: AC
  Administered 2023-10-03: 1000 mg via INTRAVENOUS
  Filled 2023-10-03: qty 100

## 2023-10-03 MED ORDER — ONDANSETRON HCL 4 MG/2ML IJ SOLN
INTRAMUSCULAR | Status: DC | PRN
  Administered 2023-10-03: 4 mg via INTRAVENOUS

## 2023-10-03 MED ORDER — SODIUM CHLORIDE 0.9 % IR SOLN
Status: DC | PRN
  Administered 2023-10-03: 3000 mL

## 2023-10-03 MED ORDER — VANCOMYCIN HCL 1000 MG IV SOLR
INTRAVENOUS | Status: DC | PRN
  Administered 2023-10-03: 1000 mg via TOPICAL

## 2023-10-03 MED ORDER — ACETAMINOPHEN 160 MG/5ML PO SOLN
325.0000 mg | Freq: Once | ORAL | Status: DC | PRN
Start: 2023-10-03 — End: 2023-10-03

## 2023-10-03 MED ORDER — ACETAMINOPHEN 500 MG PO TABS
1000.0000 mg | ORAL_TABLET | Freq: Four times a day (QID) | ORAL | Status: DC
  Administered 2023-10-03 – 2023-10-05 (×7): 1000 mg via ORAL
  Filled 2023-10-03 (×7): qty 2

## 2023-10-03 MED ORDER — HYDROMORPHONE HCL 1 MG/ML IJ SOLN: 0.5000 mg | INTRAMUSCULAR | Status: DC | PRN

## 2023-10-03 MED ORDER — PHENOL 1.4 % MT LIQD: 1.0000 | OROMUCOSAL | Status: DC | PRN

## 2023-10-03 MED ORDER — TRANEXAMIC ACID-NACL 1000-0.7 MG/100ML-% IV SOLN
1000.0000 mg | INTRAVENOUS | Status: AC
  Administered 2023-10-03: 1000 mg via INTRAVENOUS
  Filled 2023-10-03: qty 100

## 2023-10-03 MED ORDER — MIDAZOLAM HCL 2 MG/2ML IJ SOLN
INTRAMUSCULAR | Status: AC
Start: 2023-10-03 — End: ?
  Filled 2023-10-03: qty 2

## 2023-10-03 MED ORDER — PROPOFOL 10 MG/ML IV BOLUS
INTRAVENOUS | Status: AC
  Filled 2023-10-03: qty 20

## 2023-10-03 MED ORDER — MIDAZOLAM HCL 2 MG/2ML IJ SOLN
INTRAMUSCULAR | Status: DC | PRN
  Administered 2023-10-03: 2 mg via INTRAVENOUS

## 2023-10-03 MED ORDER — LIDOCAINE 2% (20 MG/ML) 5 ML SYRINGE
INTRAMUSCULAR | Status: DC | PRN
  Administered 2023-10-03: 60 mg via INTRAVENOUS

## 2023-10-03 MED ORDER — SODIUM CHLORIDE 0.9% FLUSH
3.0000 mL | Freq: Two times a day (BID) | INTRAVENOUS | Status: DC
  Administered 2023-10-04 (×2): 3 mL via INTRAVENOUS
  Administered 2023-10-05: 10 mL via INTRAVENOUS

## 2023-10-03 MED ORDER — DEXAMETHASONE SODIUM PHOSPHATE 10 MG/ML IJ SOLN
INTRAMUSCULAR | Status: AC
Start: 2023-10-03 — End: ?
  Filled 2023-10-03: qty 1

## 2023-10-03 MED ORDER — CEFAZOLIN SODIUM-DEXTROSE 2-4 GM/100ML-% IV SOLN
2.0000 g | Freq: Four times a day (QID) | INTRAVENOUS | Status: AC
  Administered 2023-10-03 – 2023-10-04 (×2): 2 g via INTRAVENOUS
  Filled 2023-10-03 (×2): qty 100

## 2023-10-03 SURGICAL SUPPLY — 39 items
BAG COUNTER SPONGE SURGICOUNT (BAG) IMPLANT
BAG ZIPLOCK 12X15 (MISCELLANEOUS) ×1 IMPLANT
BLADE SAW SGTL 11.0X1.19X90.0M (BLADE) IMPLANT
BNDG ELASTIC 3X5.8 VLCR NS LF (GAUZE/BANDAGES/DRESSINGS) IMPLANT
BNDG ELASTIC 6INX 5YD STR LF (GAUZE/BANDAGES/DRESSINGS) ×1 IMPLANT
COVER SURGICAL LIGHT HANDLE (MISCELLANEOUS) ×1 IMPLANT
CUFF TRNQT CYL 34X4.125X (TOURNIQUET CUFF) ×1 IMPLANT
DERMABOND ADVANCED .7 DNX12 (GAUZE/BANDAGES/DRESSINGS) ×1 IMPLANT
DRAPE INCISE IOBAN 66X45 STRL (DRAPES) ×3 IMPLANT
DRAPE INCISE IOBAN 85X60 (DRAPES) IMPLANT
DRAPE SHEET LG 3/4 BI-LAMINATE (DRAPES) ×1 IMPLANT
DRSG ADAPTIC 3X8 NADH LF (GAUZE/BANDAGES/DRESSINGS) IMPLANT
DRSG AQUACEL AG ADV 3.5X10 (GAUZE/BANDAGES/DRESSINGS) ×1 IMPLANT
DRSG TEGADERM 4X4.75 (GAUZE/BANDAGES/DRESSINGS) IMPLANT
DRSG VAC GRANUFOAM SM (GAUZE/BANDAGES/DRESSINGS) IMPLANT
DURAPREP 26ML APPLICATOR (WOUND CARE) ×2 IMPLANT
EVACUATOR 1/8 PVC DRAIN (DRAIN) IMPLANT
GAUZE PAD ABD 8X10 STRL (GAUZE/BANDAGES/DRESSINGS) IMPLANT
GAUZE SPONGE 2X2 8PLY STRL LF (GAUZE/BANDAGES/DRESSINGS) IMPLANT
GAUZE SPONGE 4X4 12PLY STRL (GAUZE/BANDAGES/DRESSINGS) IMPLANT
GLOVE BIO SURGEON STRL SZ 6 (GLOVE) IMPLANT
GLOVE BIOGEL PI IND STRL 6.5 (GLOVE) IMPLANT
GLOVE BIOGEL PI IND STRL 7.5 (GLOVE) ×3 IMPLANT
GLOVE ORTHO TXT STRL SZ7.5 (GLOVE) ×2 IMPLANT
GOWN STRL REUS W/ TWL LRG LVL3 (GOWN DISPOSABLE) ×2 IMPLANT
KIT TURNOVER KIT A (KITS) IMPLANT
MANIFOLD NEPTUNE II (INSTRUMENTS) ×1 IMPLANT
PACK TOTAL KNEE CUSTOM (KITS) ×1 IMPLANT
PADDING CAST COTTON 6X4 STRL (CAST SUPPLIES) ×1 IMPLANT
PROTECTOR NERVE ULNAR (MISCELLANEOUS) ×1 IMPLANT
SET HNDPC FAN SPRY TIP SCT (DISPOSABLE) ×1 IMPLANT
SET PAD KNEE POSITIONER (MISCELLANEOUS) ×1 IMPLANT
STAPLER SKIN PROX WIDE 3.9 (STAPLE) IMPLANT
SUT MNCRL AB 4-0 PS2 18 (SUTURE) ×1 IMPLANT
SUT STRATAFIX PDS+ 0 24IN (SUTURE) ×1 IMPLANT
SUT VIC AB 1 CT1 36 (SUTURE) ×2 IMPLANT
SUT VIC AB 2-0 CT1 TAPERPNT 27 (SUTURE) ×3 IMPLANT
SWAB COLLECTION DEVICE MRSA (MISCELLANEOUS) ×1 IMPLANT
SWAB CULTURE ESWAB REG 1ML (MISCELLANEOUS) ×1 IMPLANT

## 2023-10-03 NOTE — Discharge Instructions (Signed)
 INSTRUCTIONS AFTER SURGERY  Remove items at home which could result in a fall. This includes throw rugs or furniture in walking pathways ICE to the affected joint every three hours while awake for 30 minutes at a time, for at least the first 3-5 days, and then as needed for pain and swelling.  Continue to use ice for pain and swelling. You may notice swelling that will progress down to the foot and ankle.  This is normal after surgery.  Elevate your leg when you are not up walking on it.   Continue to use the breathing machine you got in the hospital (incentive spirometer) which will help keep your temperature down.  It is common for your temperature to cycle up and down following surgery, especially at night when you are not up moving around and exerting yourself.  The breathing machine keeps your lungs expanded and your temperature down.   DIET:  As you were doing prior to hospitalization, we recommend a well-balanced diet.  DRESSING / WOUND CARE / SHOWERING  Keep knee dry    ACTIVITY  Increase activity slowly as tolerated, but follow the weight bearing instructions below.   No driving for 6 weeks or until further direction given by your physician.  You cannot drive while taking narcotics.  No lifting or carrying greater than 10 lbs. until further directed by your surgeon. Avoid periods of inactivity such as sitting longer than an hour when not asleep. This helps prevent blood clots.  You may return to work once you are authorized by your doctor.     WEIGHT BEARING   Weight bearing as tolerated with assist device (walker, cane, etc) as directed, use it as long as suggested by your surgeon or therapist, typically at least 4-6 weeks.  CONSTIPATION  Constipation is defined medically as fewer than three stools per week and severe constipation as less than one stool per week.  Even if you have a regular bowel pattern at home, your normal regimen is likely to be disrupted due to multiple  reasons following surgery.  Combination of anesthesia, postoperative narcotics, change in appetite and fluid intake all can affect your bowels.   YOU MUST use at least one of the following options; they are listed in order of increasing strength to get the job done.  They are all available over the counter, and you may need to use some, POSSIBLY even all of these options:    Drink plenty of fluids (prune juice may be helpful) and high fiber foods Colace 100 mg by mouth twice a day  Senokot for constipation as directed and as needed Dulcolax (bisacodyl ), take with full glass of water   Miralax  (polyethylene glycol) once or twice a day as needed.  If you have tried all these things and are unable to have a bowel movement in the first 3-4 days after surgery call either your surgeon or your primary doctor.    If you experience loose stools or diarrhea, hold the medications until you stool forms back up.  If your symptoms do not get better within 1 week or if they get worse, check with your doctor.  If you experience the worst abdominal pain ever or develop nausea or vomiting, please contact the office immediately for further recommendations for treatment.   ITCHING:  If you experience itching with your medications, try taking only a single pain pill, or even half a pain pill at a time.  You can also use Benadryl  over the counter for  itching or also to help with sleep.   TED HOSE STOCKINGS:  Use stockings on both legs until for at least 2 weeks or as directed by physician office. They may be removed at night for sleeping.  MEDICATIONS:  See your medication summary on the "After Visit Summary" that nursing will review with you.  You may have some home medications which will be placed on hold until you complete the course of blood thinner medication.  It is important for you to complete the blood thinner medication as prescribed.  PRECAUTIONS:  If you experience chest pain or shortness of breath - call  [phone] immediately for transfer to the hospital emergency department.   If you develop a fever greater that 101 F, purulent drainage from wound, increased redness or drainage from wound, foul odor from the wound/dressing, or calf pain - CONTACT YOUR SURGEON.                                                   FOLLOW-UP APPOINTMENTS:  If you do not already have a post-op appointment, please call the office for an appointment to be seen by your surgeon.  Guidelines for how soon to be seen are listed in your "After Visit Summary", but are typically between 1-4 weeks after surgery.  POST-OPERATIVE OPIOID TAPER INSTRUCTIONS: It is important to wean off of your opioid medication as soon as possible. If you do not need pain medication after your surgery it is ok to stop day one. Opioids include: Codeine, Hydrocodone (Norco, Vicodin), Oxycodone (Percocet, oxycontin ) and hydromorphone  amongst others.  Long term and even short term use of opiods can cause: Increased pain response Dependence Constipation Depression Respiratory depression And more.  Withdrawal symptoms can include Flu like symptoms Nausea, vomiting And more Techniques to manage these symptoms Hydrate well Eat regular healthy meals Stay active Use relaxation techniques(deep breathing, meditating, yoga) Do Not substitute Alcohol to help with tapering If you have been on opioids for less than two weeks and do not have pain than it is ok to stop all together.  Plan to wean off of opioids This plan should start within one week post op of your joint replacement. Maintain the same interval or time between taking each dose and first decrease the dose.  Cut the total daily intake of opioids by one tablet each day Next start to increase the time between doses. The last dose that should be eliminated is the evening dose.   MAKE SURE YOU:  Understand these instructions.  Get help right away if you are not doing well or get worse.    Thank  you for letting us  be a part of your medical care team.  It is a privilege we respect greatly.  We hope these instructions will help you stay on track for a fast and full recovery!

## 2023-10-03 NOTE — Interval H&P Note (Signed)
 History and Physical Interval Note:  10/03/2023 2:48 PM  [name] Medicine  has presented today for surgery, with the diagnosis of Status post right total knee reimplantation, revision.  The various methods of treatment have been discussed with the patient and family. After consideration of risks, benefits and other options for treatment, the patient has consented to  Procedure(s) with comments: IRRIGATION AND DEBRIDEMENT KNEE (Right) - 1 HR APPLICATION OF WOUND VAC (Right) as a surgical intervention.  The patient's history has been reviewed, patient examined, no change in status, stable for surgery.  I have reviewed the patient's chart and labs.  Questions were answered to the patient's satisfaction.     [name] [name]

## 2023-10-03 NOTE — Transfer of Care (Signed)
 Immediate Anesthesia Transfer of Care Note  Patient: [name] Medicine  Procedure(s) Performed: IRRIGATION AND DEBRIDEMENT KNEE (Right: Knee) APPLICATION OF WOUND VAC (Right: Knee)  Patient Location: PACU  Anesthesia Type:General  Level of Consciousness: drowsy and patient cooperative  Airway & Oxygen Therapy: Patient Spontanous Breathing and Patient connected to nasal cannula oxygen  Post-op Assessment: Report given to RN and Post -op Vital signs reviewed and stable  Post vital signs: Reviewed and stable  Last Vitals:  Vitals Value Taken Time  BP 156/88 10/03/23 1608  Temp    Pulse 76 10/03/23 1612  Resp 16 10/03/23 1612  SpO2 99 % 10/03/23 1612  Vitals shown include unfiled device data.  Last Pain:  Vitals:   10/03/23 1429  TempSrc: Oral  PainSc:          Complications: No notable events documented.

## 2023-10-03 NOTE — H&P (Signed)
 TOTAL KNEE I&D ADMISSION H&P  Patient is being admitted for right knee I&D with wound vac application  Therapy Plans: outpatient therapy at [facility] Disposition: Home with wife Planned DVT Prophylaxis: aspirin  81mg  BID DME needed: none PCP: Dr. Rollene, clearance received  TXA: IV Allergies: NKDA Anesthesia Concerns: none BMI: 34.1 Last HgbA1c: Not diabetic    Other: - oxycodone , tizanidine , tylenol , celebrex   - No hx of VTE or cancer - Very active  Will need home vac system - will discuss with social work for assistance   Subjective:  Chief Complaint:right knee pain.  HPI: [name] [name] Medicine, 72 y.o. male, has a complex history with his right knee. He had primary TKA with PJI in early 2024 which was then treated with 2 stage revision in May/August 2024. He has recovered well from this in general with no signs of recurrent infection. He has full extension and flexion past 90 degrees. He unfortunately has had recurrent prepatellar seromas that require aspiration every few weeks of a large amount of fluid (500cc). This has not reduced in volume over time since the time of his last surgery and so Dr. Ernie discussed with him need for I&D with wound vac placement.   Patient Active Problem List   Diagnosis Date Noted   Blood loss anemia 08/16/2023   S/P revision of total knee, right 06/01/2023   Infection of prosthetic right knee joint (HCC) 02/21/2023   Infection of right knee (HCC) 11/14/2022   Chronic sinusitis 09/02/2022   Encounter for general adult medical examination with abnormal findings 07/09/2021   BPH (benign prostatic hyperplasia) 07/09/2021   MDD (major depressive disorder), recurrent, in full remission (HCC) 07/09/2021   Essential hypertension 10/20/2008   Past Medical History:  Diagnosis Date   Arthritis    Bronchitis    Depression    GERD (gastroesophageal reflux disease)    Hypertension    Skin cancer     Past Surgical History:  Procedure Laterality Date    CHOLECYSTECTOMY N/A 02/10/2021   Procedure: LAPAROSCOPIC CHOLECYSTECTOMY;  Surgeon: Vanderbilt Ned, MD;  Location: [facility];  Service: General;  Laterality: N/A;   EXCISIONAL TOTAL KNEE ARTHROPLASTY WITH ANTIBIOTIC SPACERS Right 02/21/2023   Procedure: EXCISIONAL TOTAL KNEE ARTHROPLASTY WITH ANTIBIOTIC SPACERS;  Surgeon: Ernie Cough, MD;  Location: [facility];  Service: Orthopedics;  Laterality: Right;   HERNIA REPAIR     I & D KNEE WITH POLY EXCHANGE Right 11/15/2022   Procedure: IRRIGATION AND DEBRIDEMENT KNEE WITH  POLY EXCHANGE;  Surgeon: Gerome Charleston, MD;  Location: [facility];  Service: Orthopedics;  Laterality: Right;  adductor canal, no antibiotics prior to culture add on room to follow other case 60   Knee Surgery Right 10/25/2022   REIMPLANTATION OF TOTAL KNEE Right 06/01/2023   Procedure: REIMPLANTATION/REVISION OF TOTAL KNEE;  Surgeon: Ernie Cough, MD;  Location: [facility];  Service: Orthopedics;  Laterality: Right;   ROTATOR CUFF REPAIR     SKIN CANCER EXCISION      No current facility-administered medications for this encounter.   Current Outpatient Medications  Medication Sig Dispense Refill Last Dose/Taking   calcium  carbonate (TUMS - DOSED IN MG ELEMENTAL CALCIUM ) 500 MG chewable tablet Chew 2 tablets by mouth 2 (two) times daily as needed for indigestion or heartburn.   Taking As Needed   gabapentin  (NEURONTIN ) 300 MG capsule Take 300 mg by mouth 2 (two) times daily.   Taking   Ketotifen Fumarate (ALLERGY EYE DROPS OP) Place 1 drop into both eyes daily  as needed (red/itchy eyes).   Taking As Needed   naproxen  (NAPROSYN ) 500 MG tablet Take 500 mg by mouth daily.   Taking   polyethylene glycol (MIRALAX  / GLYCOLAX ) 17 g packet Take 17 g by mouth 2 (two) times daily. (Patient taking differently: Take 17 g by mouth daily as needed for mild constipation or moderate constipation.) 14 each 0 Taking Differently   saccharomyces boulardii (FLORASTOR) 250 MG capsule Take 250 mg by mouth daily.    Taking   sertraline  (ZOLOFT ) 100 MG tablet Take 1 tablet (100 mg total) by mouth daily. 90 tablet 3 Taking   sodium chloride  (OCEAN) 0.65 % SOLN nasal spray Place 1 spray into both nostrils daily as needed for congestion.   Taking As Needed   tamsulosin  (FLOMAX ) 0.4 MG CAPS capsule Take 0.4 mg by mouth at bedtime.  0 Taking   telmisartan  (MICARDIS ) 80 MG tablet Take 1 tablet (80 mg total) by mouth daily. 90 tablet 3 Taking   Allergies  Allergen Reactions   Avapro  [Irbesartan ] Cough   Chlorhexidine  Rash    Reaction to PICC line dressing with Chlorhexidine  gel Not allergic to Chlorhexidine  soap   Lexapro [Escitalopram Oxalate] Anxiety    insomnia    Social History   Tobacco Use   Smoking status: Never   Smokeless tobacco: Never  Substance Use Topics   Alcohol use: Yes    Comment: socially    Family History  Problem Relation Age of Onset   Emphysema Mother        heart failure indusce emphysema   Heart disease Father    Heart attack Father    Heart disease Sister 74       [id number]     Review of Systems  Constitutional:  Negative for chills and fever.  Respiratory:  Negative for cough and shortness of breath.   Cardiovascular:  Negative for chest pain.  Gastrointestinal:  Negative for nausea and vomiting.  Musculoskeletal:  Positive for arthralgias.     Objective:  Physical Exam Right knee exam: His incision is healed. He does have in the central portion a 1 cm area of skin blistering. He has a very large palpable prepatellar swelling No warmth or erythema around the blister or the knee in general Near full active and passive extension with flexion limited based on his multiple operative procedures as well as the swelling and his need to around 100 degrees  Vital signs in last 24 hours: Temp:  [98.2 F (36.8 C)] 98.2 F (36.8 C) (12/30 0835) Pulse Rate:  [61] 61 (12/30 0835) Resp:  [16] 16 (12/30 0835) BP: (140)/(81) 140/81 (12/30 0835) SpO2:  [99 %] 99 % (12/30  0835) Weight:  [106.9 kg] 106.9 kg (12/30 0835)  Labs:   Estimated body mass index is 34.79 kg/m as calculated from the following:   Height as of 10/02/23: 5' 9 (1.753 m).   Weight as of 10/02/23: 106.9 kg.   Imaging Review Plain radiographs demonstrate components in stable positioning.       Assessment/Plan:  Recurrent prepatellar seroma, right total knee   The patient history, physical examination, clinical judgment of the provider and imaging studies are consistent with prepatellar seroma of the right knee(s) and I&D is deemed medically necessary. The treatment options including medical management, injection therapy arthroscopy and arthroplasty were discussed at length. The risks and benefits of total knee arthroplasty were presented and reviewed. The risks due to aseptic loosening, infection, stiffness, thromboembolic complications and other imponderables were  discussed. The patient acknowledged the explanation, agreed to proceed with the plan and consent was signed. Patient is being admitted for inpatient treatment for surgery, pain control, PT, OT, prophylactic antibiotics, VTE prophylaxis, progressive ambulation and ADL's and discharge planning. The patient is planning to be discharged  home.   Rosina Calin, PA-C Orthopedic Surgery [facility] [place] [phone]

## 2023-10-03 NOTE — Brief Op Note (Signed)
 10/03/2023  2:48 PM  PATIENT:  [name] [name] DELENA Medicine  72 y.o. male  PRE-OPERATIVE DIAGNOSIS:  Status post right total knee reimplantation, revision for two-stage treatment of a septic right knee with recurrent prepatellar seroma formation  POST-OPERATIVE DIAGNOSIS:  Status post right total knee reimplantation, revision for two-stage treatment of a septic right knee with recurrent prepatellar seroma formation  PROCEDURE:  Procedure(s) with comments: IRRIGATION AND DEBRIDEMENT KNEE (Right) - 1 HR APPLICATION OF WOUND VAC (Right)  SURGEON:  Surgeons and Role:    DEWAINE Ernie Cough, MD - Primary  PHYSICIAN ASSISTANT: Rosina Calin, PA-C  ANESTHESIA:   general  EBL:  <100 cc  BLOOD ADMINISTERED:none  DRAINS: small wound vac   LOCAL MEDICATIONS USED:  1. Topical TXA, 2. 1 gm of Vancomycin   SPECIMEN:  No Specimen  DISPOSITION OF SPECIMEN:  N/A  COUNTS:  YES  TOURNIQUET:  11 min at 225 mmHg  DICTATION: .Other Dictation: Dictation Number [id number]  PLAN OF CARE: Admit to inpatient   PATIENT DISPOSITION:  PACU - hemodynamically stable.   Delay start of Pharmacological VTE agent (>24hrs) due to surgical blood loss or risk of bleeding: no

## 2023-10-03 NOTE — Plan of Care (Signed)
  Problem: Activity: Goal: Risk for activity intolerance will decrease Outcome: Progressing   Problem: Pain Management: Goal: General experience of comfort will improve Outcome: Progressing   Problem: Safety: Goal: Ability to remain free from injury will improve Outcome: Progressing

## 2023-10-03 NOTE — Anesthesia Procedure Notes (Signed)
 Procedure Name: LMA Insertion Date/Time: 10/03/2023 3:00 PM  Performed by: Lailee Hoelzel, Corean BROCKS, CRNAPre-anesthesia Checklist: Patient identified, Emergency Drugs available, Suction available and Patient being monitored Patient Re-evaluated:Patient Re-evaluated prior to induction Oxygen Delivery Method: Circle system utilized Preoxygenation: Pre-oxygenation with 100% oxygen Induction Type: IV induction Ventilation: Mask ventilation without difficulty LMA: LMA inserted LMA Size: 4.0 Number of attempts: 1 Airway Equipment and Method: Bite block Placement Confirmation: positive ETCO2 Tube secured with: Tape Dental Injury: Teeth and Oropharynx as per pre-operative assessment

## 2023-10-03 NOTE — Op Note (Addendum)
 NAME: [name] [name], [name] A. MEDICAL RECORD NO: [id number] ACCOUNT NO: [id number] DATE OF BIRTH: [birth year] FACILITY: [facility] LOCATION: [id number] PHYSICIAN: Donnice BIRCH. Ernie, MD  Operative Report   DATE OF PROCEDURE: 10/03/2023   PREOPERATIVE DIAGNOSIS:  Status post revision/reimplantation of right total knee replacement with persistent and recurring postoperative prepatellar seroma.  POSTOPERATIVE DIAGNOSIS:  Status post revision/reimplantation of right total knee replacement with persistent and recurring postoperative prepatellar seroma.  PROCEDURE:  Excisional and non excisional debridement of right prepatellar space with application of a small wound VAC.  The area involved was his entire knee incision of about 8 to 10 inches involving about a width medial lateral of about 4 to 5 inches in a depth with the knee swollen  of about 4 cm or 2 inches.    SURGEON:  Donnice BIRCH. Ernie, MD  ASSISTANT:  Rosina Calin, PA-C.  Note that Ms. Calin was present for the entirety of the case and preoperative positioning, perioperative management of the operative extremity, general facilitation of the case, and primary wound closure.  ANESTHESIA:  General.  BLOOD LOSS:  Less than 100 mL.    Local medication used.  I did wash the knee out with normal saline solution of about a liter followed by about 250 mL of Prontosan antimicrobial solution as well as vancomycin  powder as well as topical Tranexamic acid .   TOURNIQUET:  Tourniquet was up for 11 minutes at 225 mmHg.  INDICATIONS FOR THE PROCEDURE:  The patient is a very pleasant 72 year old male with a complex history involving this right knee.  His initial total knee arthroplasty unfortunately developed infection.  He was subsequently referred and underwent  resection of his knee in 02/2023 followed by reimplantation in 05/2023.  He is now about 4 months out from surgery and has been seen and followed in the office for recurrent prepatellar swelling and seromas.   We have aspirated this multiple times.  Based  on the recurrence of this, we had a lengthy discussion regarding management of going back to the operating room and attempting to decompress this area with the use of a wound VAC to allow for secondary healing.  I discussed the concerns I have regarding  the nature of his knee in terms of infection management and the importance of remaining on oral antibiotics until this is addressed.  This process and discussion was fairly extensive as I did not feel that we were making progress with the recurrent  aspirations as the amount of fluid that we withdrew from his knee each time was the same.  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  DESCRIPTION OF PROCEDURE:  The patient was brought to the operative theater.  Once adequate anesthesia, preoperative antibiotics, Ancef  administered.  He was positioned supine with a right thigh tourniquet placed.  The right lower extremity was then  prepped and draped in sterile fashion.  A timeout was performed identifying the patient, planned procedure, and extremity.  Leg was exsanguinated and the tourniquet elevated to 225 mmHg.  As I had discussed with him preoperatively, I made an incision on  the proximal aspect of his incision.  In doing so, I was able to open up this area and evacuated 400 mL of seroma from his knee.  The fluid would appear to be lighter tinged than our most recent aspiration last week.  I then opened up the incision in  total of about  half the incision or about 4 or 5 inches.  This allowed for adequate debridement.  There were no signs of infection.  There was no evidence of any dehiscence of the extensor mechanism.  At this point, following a little bit of a sharp  tissue debridement with a scalpel and the Bovie the subcutaneous underlying tissue and pseudocapsule, we irrigated the knee with 1 liter  of normal saline solution pulse lavage.  I followed this up with about 200 mL of Prontosan solution holding in the  wound for 5 minutes.  I then placed a dose of topical-based tranexamic acid  and kept this in the wound to try to see if this would help with any potential local hemostasis.  We let the tourniquet down.  I did place 1 g of vancomycin  powder and therefore  protective purposes.  We then reapproximated the inferior portion of this incision, which was again only about 4 or 5 inches using nylon sutures.  We then cut and placed a small wound VAC into the opening and applied the appropriate drapes and suction.   The remainder of the drapes were removed.  His knee was clean, dry, and dressed sterilely.  He was brought to the recovery room in stable condition and tolerated the procedure well.  Findings were reviewed with his wife.  He will be admitted to the  hospital as we work on game plans for wound [facility] management as an outpatient.      SUJ D: 10/03/2023 4:09:08 pm T: 10/03/2023 10:45:00 pm  JOB: [id number]/ [id number]

## 2023-10-03 NOTE — Anesthesia Preprocedure Evaluation (Addendum)
 Anesthesia Evaluation  Patient identified by MRN, date of birth, ID band Patient awake    Reviewed: Allergy & Precautions, NPO status , Patient's Chart, lab work & pertinent test results  Airway Mallampati: I  TM Distance: >3 FB Neck ROM: Full    Dental  (+) Teeth Intact, Dental Advisory Given   Pulmonary neg pulmonary ROS   breath sounds clear to auscultation       Cardiovascular hypertension, Pt. on medications  Rhythm:Regular Rate:Normal     Neuro/Psych  PSYCHIATRIC DISORDERS  Depression    negative neurological ROS     GI/Hepatic Neg liver ROS,GERD  ,,  Endo/Other  negative endocrine ROS    Renal/GU negative Renal ROS     Musculoskeletal  (+) Arthritis ,    Abdominal   Peds  Hematology  (+) Blood dyscrasia, anemia   Anesthesia Other Findings   Reproductive/Obstetrics                             Anesthesia Physical Anesthesia Plan  ASA: 2  Anesthesia Plan: General   Post-op Pain Management: Tylenol  PO (pre-op)* and Toradol  IV (intra-op)*   Induction: Intravenous  PONV Risk Score and Plan: 3 and Ondansetron , Dexamethasone  and Midazolam   Airway Management Planned: LMA  Additional Equipment: None  Intra-op Plan:   Post-operative Plan: Extubation in OR  Informed Consent: I have reviewed the patients History and Physical, chart, labs and discussed the procedure including the risks, benefits and alternatives for the proposed anesthesia with the patient or authorized representative who has indicated his/her understanding and acceptance.     Dental advisory given  Plan Discussed with: CRNA  Anesthesia Plan Comments:        Anesthesia Quick Evaluation

## 2023-10-04 LAB — CBC
HCT: 39.2 % (ref 39.0–52.0)
Hemoglobin: 12.4 g/dL — ABNORMAL LOW (ref 13.0–17.0)
MCH: 28.2 pg (ref 26.0–34.0)
MCHC: 31.6 g/dL (ref 30.0–36.0)
MCV: 89.3 fL (ref 80.0–100.0)
Platelets: 163 10*3/uL (ref 150–400)
RBC: 4.39 MIL/uL (ref 4.22–5.81)
RDW: 16.7 % — ABNORMAL HIGH (ref 11.5–15.5)
WBC: 5.5 10*3/uL (ref 4.0–10.5)
nRBC: 0 % (ref 0.0–0.2)

## 2023-10-04 LAB — BASIC METABOLIC PANEL
Anion gap: 7 (ref 5–15)
BUN: 17 mg/dL (ref 8–23)
CO2: 26 mmol/L (ref 22–32)
Calcium: 8.5 mg/dL — ABNORMAL LOW (ref 8.9–10.3)
Chloride: 102 mmol/L (ref 98–111)
Creatinine, Ser: 0.85 mg/dL (ref 0.61–1.24)
GFR, Estimated: >60 mL/min (ref >60)
Glucose, Bld: 159 mg/dL — ABNORMAL HIGH (ref 70–99)
Potassium: 4 mmol/L (ref 3.5–5.1)
Sodium: 135 mmol/L (ref 135–145)

## 2023-10-04 MED ORDER — CEFADROXIL 500 MG PO CAPS
500.0000 mg | ORAL_CAPSULE | Freq: Two times a day (BID) | ORAL | Status: DC
  Administered 2023-10-04 – 2023-10-05 (×3): 500 mg via ORAL
  Filled 2023-10-04 (×3): qty 1

## 2023-10-04 NOTE — Plan of Care (Signed)

## 2023-10-04 NOTE — Progress Notes (Signed)
 Patient ID: [name] Medicine, male   DOB: 1951-06-22, 74 y.o.   MRN: [id number] Subjective: 1 Day Post-Op Procedure(s) (LRB): IRRIGATION AND DEBRIDEMENT KNEE (Right) APPLICATION OF WOUND VAC (Right)    Patient reports pain as mild. No events Reviewed intra-operative findings and procedure  Objective:   VITALS:   Vitals:   10/04/23 0136 10/04/23 0538  BP: 124/62 130/66  Pulse: 70 67  Resp: 17 17  Temp: (!) 97.5 F (36.4 C) 97.9 F (36.6 C)  SpO2: 95% 97%    Neurovascular intact Incision: dressing C/D/I Wound VAC in place and functioning with 125cc output recorded  LABS Recent Labs    10/03/23 1613 10/04/23 0345  HGB 12.3* 12.4*  HCT 37.9* 39.2  WBC 5.6 5.5  PLT 151 163    Recent Labs    10/02/23 0854 10/04/23 0345  NA 137 135  K 4.1 4.0  BUN 18 17  CREATININE 0.74 0.85  GLUCOSE 104* 159*    No results for input(s): LABPT, INR in the last 72 hours.   Assessment/Plan: 1 Day Post-Op Procedure(s) (LRB): IRRIGATION AND DEBRIDEMENT KNEE (Right) APPLICATION OF WOUND VAC (Right)   Advance diet Up with therapy Work to arrange for [facility] services at least arranging to have supplies delivered to the home We will be working towards a plan to manage the wound over the next several weeks PO antibiotics while being treated with the VAC

## 2023-10-04 NOTE — Plan of Care (Signed)
  Problem: Pain Management: Goal: General experience of comfort will improve Outcome: Progressing   Problem: Activity: Goal: Range of joint motion will improve Outcome: Progressing   Problem: Pain Management: Goal: Pain level will decrease with appropriate interventions Outcome: Progressing

## 2023-10-04 NOTE — Evaluation (Signed)
 Physical Therapy Evaluation Patient Details Name: [name] [name] MRN: [id number] DOB: Nov 02, 1950 Today's Date: 10/04/2023  History of Present Illness  73 yo male s/p I &D persistent and recurring postoperative prepatellar seroma on 10/03/23. 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  Pt independent at baseline; pt known to me from previous admissions. Pleasant, cooperative and motivated to mobilize. Amb ~ 160' with RW and supervision, no incr pain. Anticipate steady progress. Continue PT in acute setting   Pt currently with functional limitations due to the deficits listed below (see PT Problem List). Pt will benefit from acute skilled PT to increase their independence and safety with mobility to allow discharge.           If plan is discharge home, recommend the following: Help with stairs or ramp for entrance;Assistance with cooking/housework;Assist for transportation   Can travel by private vehicle        Equipment Recommendations None recommended by PT  Recommendations for Other Services       Functional Status Assessment Patient has had a recent decline in their functional status and demonstrates the ability to make significant improvements in function in a reasonable and predictable amount of time.     Precautions / Restrictions Precautions Precautions: Knee Precaution Comments: VAC R knee Restrictions Weight Bearing Restrictions Per Provider Order: No Other Position/Activity Restrictions: WBAT      Mobility  Bed Mobility Overal bed mobility: Needs Assistance Bed Mobility: Supine to Sit     Supine to sit: Supervision     General bed mobility comments: for lines    Transfers Overall transfer level: Needs assistance Equipment used: Rolling walker (2 wheels) Transfers: Sit to/from Stand Sit to  Stand: Contact guard assist           General transfer comment: for safety    Ambulation/Gait Ambulation/Gait assistance: Supervision, Contact guard assist Gait Distance (Feet): 160 Feet Assistive device: Rolling walker (2 wheels) Gait Pattern/deviations: Step-to pattern, Step-through pattern       General Gait Details: gait progression to step through pattern, good wt shift to RLE without incr pain  Stairs            Wheelchair Mobility     Tilt Bed    Modified Rankin (Stroke Patients Only)       Balance                                             Pertinent Vitals/Pain Pain Assessment Pain Assessment: 0-10 Pain Score: 3  Pain Location: R knee Pain Descriptors / Indicators: Discomfort Pain Intervention(s): Limited activity within patient's tolerance, Monitored during session, Premedicated before session, Repositioned    Home Living Family/patient expects to be discharged to:: Private residence Living Arrangements: Spouse/significant other Available Help at Discharge: Family;Available 24 hours/day Type of Home: House Home Access: Stairs to enter Entrance Stairs-Rails: Right;Left;Can reach both Entrance Stairs-Number of Steps: 3   Home Layout: One level Home Equipment: [employer] (2 wheels);Cane - single point;Grab bars - tub/shower;Grab bars - toilet      Prior Function Prior Level of Function : Independent/Modified Independent             Mobility Comments: independent  Extremity/Trunk Assessment   Upper Extremity Assessment Upper Extremity Assessment: Overall WFL for tasks assessed    Lower Extremity Assessment Lower Extremity Assessment: RLE deficits/detail RLE Deficits / Details: strength grossly WFL, knee flexion AROM ~ 95 degrees (pt reports 107* prior to surgery)       Communication   Communication Communication: No apparent difficulties  Cognition Arousal: Alert Behavior During Therapy: WFL for  tasks assessed/performed Overall Cognitive Status: Within Functional Limits for tasks assessed                                          General Comments      Exercises Total Joint Exercises Ankle Circles/Pumps: AROM, Both, 5 reps   Assessment/Plan    PT Assessment Patient needs continued PT services  PT Problem List Decreased mobility;Pain;Decreased range of motion       PT Treatment Interventions DME instruction;Gait training;Stair training;Functional mobility training;Therapeutic activities;Therapeutic exercise;Patient/family education    PT Goals (Current goals can be found in the Care Plan section)  Acute Rehab PT Goals PT Goal Formulation: With patient Time For Goal Achievement: 10/11/23 Potential to Achieve Goals: Good    Frequency 7X/week     Co-evaluation               AM-PAC PT 6 Clicks Mobility  Outcome Measure Help needed turning from your back to your side while in a flat bed without using bedrails?: None Help needed moving from lying on your back to sitting on the side of a flat bed without using bedrails?: None Help needed moving to and from a bed to a chair (including a wheelchair)?: A Little Help needed standing up from a chair using your arms (e.g., wheelchair or bedside chair)?: A Little Help needed to walk in hospital room?: A Little Help needed climbing 3-5 steps with a railing? : A Little 6 Click Score: 20    End of Session Equipment Utilized During Treatment: Gait belt Activity Tolerance: Patient tolerated treatment well Patient left: with call bell/phone within reach;in chair;with family/visitor present   PT Visit Diagnosis: Other abnormalities of gait and mobility (R26.89)    Time: 9156-9089 PT Time Calculation (min) (ACUTE ONLY): 27 min   Charges:   PT Evaluation $PT Eval Low Complexity: 1 Low PT Treatments $Gait Training: 8-22 mins PT General Charges $$ ACUTE PT VISIT: 1 Visit         Serra Younan,  PT  Acute Rehab Dept [facility]) [phone]  10/04/2023   [facility] 10/04/2023, 9:19 AM

## 2023-10-05 ENCOUNTER — Encounter (HOSPITAL_COMMUNITY): Payer: Self-pay | Admitting: Orthopedic Surgery

## 2023-10-05 LAB — CBC
HCT: 39.8 % (ref 39.0–52.0)
Hemoglobin: 12.5 g/dL — ABNORMAL LOW (ref 13.0–17.0)
MCH: 28.3 pg (ref 26.0–34.0)
MCHC: 31.4 g/dL (ref 30.0–36.0)
MCV: 90.2 fL (ref 80.0–100.0)
Platelets: 158 10*3/uL (ref 150–400)
RBC: 4.41 MIL/uL (ref 4.22–5.81)
RDW: 17.1 % — ABNORMAL HIGH (ref 11.5–15.5)
WBC: 7.9 10*3/uL (ref 4.0–10.5)
nRBC: 0 % (ref 0.0–0.2)

## 2023-10-05 MED ORDER — METHOCARBAMOL 500 MG PO TABS: 500.0000 mg | ORAL_TABLET | Freq: Four times a day (QID) | ORAL | 2 refills | Status: DC | PRN

## 2023-10-05 MED ORDER — CEFADROXIL 500 MG PO CAPS: 500.0000 mg | ORAL_CAPSULE | Freq: Two times a day (BID) | ORAL | 0 refills | Status: AC

## 2023-10-05 MED ORDER — OXYCODONE HCL 5 MG PO TABS: 5.0000 mg | ORAL_TABLET | ORAL | 0 refills | Status: DC | PRN

## 2023-10-05 NOTE — Progress Notes (Signed)
 Physical Therapy Treatment Patient Details Name: [name] [name] MRN: [id number] DOB: 03/01/1951 Today's Date: 10/05/2023   History of Present Illness 73 yo male s/p I &D persistent and recurring postoperative prepatellar seroma on 10/03/23. 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 Pain remains controlled during gait/mobility. Reviewed clamping/un-clamping VAC after initial education per RN.  Pt feels comfortable ascending stairs at home given prior surgeries; pt is ready to d/c from PT standpoint.    If plan is discharge home, recommend the following: Help with stairs or ramp for entrance;Assistance with cooking/housework;Assist for transportation   Can travel by private vehicle        Equipment Recommendations  None recommended by PT    Recommendations for Other Services       Precautions / Restrictions Precautions Precautions: Knee Precaution Comments: VAC R knee Restrictions Weight Bearing Restrictions Per Provider Order: No RLE Weight Bearing Per Provider Order: Weight bearing as tolerated     Mobility  Bed Mobility Overal bed mobility: Independent                  Transfers Overall transfer level: Modified independent Equipment used: Rolling walker (2 wheels), None Transfers: Sit to/from Stand Sit to Stand: Supervision           General transfer comment: for safety; initial STS without device, no LOB    Ambulation/Gait Ambulation/Gait assistance: Supervision, Modified independent (Device/Increase time) Gait Distance (Feet): 300 Feet Assistive device: Rolling walker (2 wheels) Gait Pattern/deviations: Step-through pattern       General Gait Details: step through pattern, good wt shift to RLE without incr pain; R knee flexion limited during swing phase   Stairs              Wheelchair Mobility     Tilt Bed    Modified Rankin (Stroke Patients Only)       Balance                                            Cognition Arousal: Alert Behavior During Therapy: WFL for tasks assessed/performed Overall Cognitive Status: Within Functional Limits for tasks assessed                                          Exercises      General Comments        Pertinent Vitals/Pain Pain Assessment Pain Assessment: 0-10 Pain Score: 2  Pain Location: R knee Pain Descriptors / Indicators: Discomfort Pain Intervention(s): Limited activity within patient's tolerance, Monitored during session    Home Living                          Prior Function            PT Goals (current goals can now be found in the care plan section) Acute Rehab PT Goals PT Goal Formulation: With patient Time For Goal Achievement: 10/11/23 Potential to Achieve Goals: Good Progress towards PT goals: Progressing toward goals    Frequency    7X/week      PT Plan  Co-evaluation              AM-PAC PT 6 Clicks Mobility   Outcome Measure  Help needed turning from your back to your side while in a flat bed without using bedrails?: None Help needed moving from lying on your back to sitting on the side of a flat bed without using bedrails?: None Help needed moving to and from a bed to a chair (including a wheelchair)?: None Help needed standing up from a chair using your arms (e.g., wheelchair or bedside chair)?: None Help needed to walk in hospital room?: A Little   6 Click Score: 19    End of Session Equipment Utilized During Treatment: Gait belt Activity Tolerance: Patient tolerated treatment well Patient left: in bed;with call bell/phone within reach   PT Visit Diagnosis: Other abnormalities of gait and mobility (R26.89)     Time: 1213-1229 PT Time Calculation (min) (ACUTE ONLY): 16 min  Charges:    $Gait  Training: 8-22 mins PT General Charges $$ ACUTE PT VISIT: 1 Visit                     Leslie Jester, PT  Acute Rehab Dept [facility]) [phone]  10/05/2023    [facility] 10/05/2023, 12:32 PM

## 2023-10-05 NOTE — Progress Notes (Signed)
   Subjective: 2 Days Post-Op Procedure(s) (LRB): IRRIGATION AND DEBRIDEMENT KNEE (Right) APPLICATION OF WOUND VAC (Right) Patient reports pain as mild.   Patient seen in rounds for Dr. Ernie. Patient is well, and has had no acute complaints or problems. No acute events overnight. Awaiting home vac unit. Ambulated well with PT.  We will continue therapy today.   Objective: Vital signs in last 24 hours: Temp:  [97.6 F (36.4 C)-98.6 F (37 C)] 97.7 F (36.5 C) (01/02 0452) Pulse Rate:  [58-68] 58 (01/02 0452) Resp:  [15-17] 16 (01/02 0452) BP: (131-156)/(61-80) 141/77 (01/02 0452) SpO2:  [96 %] 96 % (01/02 0452)  Intake/Output from previous day:  Intake/Output Summary (Last 24 hours) at 10/05/2023 0909 Last data filed at 10/05/2023 0500 Gross per 24 hour  Intake 1020 ml  Output 2925 ml  Net -1905 ml     Intake/Output this shift: No intake/output data recorded.  Labs: Recent Labs    10/03/23 1613 10/04/23 0345 10/05/23 0357  HGB 12.3* 12.4* 12.5*   Recent Labs    10/04/23 0345 10/05/23 0357  WBC 5.5 7.9  RBC 4.39 4.41  HCT 39.2 39.8  PLT 163 158   Recent Labs    10/04/23 0345  NA 135  K 4.0  CL 102  CO2 26  BUN 17  CREATININE 0.85  GLUCOSE 159*  CALCIUM  8.5*   No results for input(s): LABPT, INR in the last 72 hours.  Exam: General - Patient is Alert and Oriented Extremity - Neurologically intact Sensation intact distally Intact pulses distally Dorsiflexion/Plantar flexion intact Dressing - Wound vac in place with good suction Motor Function - intact, moving foot and toes well on exam.   Past Medical History:  Diagnosis Date   Arthritis    Bronchitis    Depression    GERD (gastroesophageal reflux disease)    Hypertension    Skin cancer     Assessment/Plan: 2 Days Post-Op Procedure(s) (LRB): IRRIGATION AND DEBRIDEMENT KNEE (Right) APPLICATION OF WOUND VAC (Right) Principal Problem:   Postoperative seroma of subcutaneous tissue after  non-dermatologic procedure Active Problems:   Postoperative seroma of musculoskeletal structure after musculoskeletal procedure  Estimated body mass index is 34.84 kg/m as calculated from the following:   Height as of this encounter: 5' 9 (1.753 m).   Weight as of this encounter: 107 kg. Advance diet Up with therapy D/C IV fluids   DVT Prophylaxis - Aspirin  Weight bearing as tolerated.  Will go home on cefadroxil  until drain is removed. We will manage vac changes in office.   Awaiting home vac and arrangement of supplies.  May d/c once this is ready.  Rosina Calin, PA-C Orthopedic Surgery [phone] 10/05/2023, 9:09 AM

## 2023-10-05 NOTE — Anesthesia Postprocedure Evaluation (Signed)
 Anesthesia Post Note  Patient: [name] [name] Medicine  Procedure(s) Performed: IRRIGATION AND DEBRIDEMENT KNEE (Right: Knee) APPLICATION OF WOUND VAC (Right: Knee)     Patient location during evaluation: PACU Anesthesia Type: General Level of consciousness: awake and alert Pain management: pain level controlled Vital Signs Assessment: post-procedure vital signs reviewed and stable Respiratory status: spontaneous breathing, nonlabored ventilation, respiratory function stable and patient connected to nasal cannula oxygen Cardiovascular status: blood pressure returned to baseline and stable Postop Assessment: no apparent nausea or vomiting Anesthetic complications: no   No notable events documented.    Aala Ransom D Adiba Fargnoli

## 2023-10-05 NOTE — TOC Transition Note (Signed)
 Transition of Care [facility]) - Discharge Note  Patient Details  Name: [name] [name] MRN: [id number] Date of Birth: 1951/06/21  Transition of Care [facility]) CM/SW Contact:  Duwaine GORMAN Aran, LCSW Phone Number: 10/05/2023, 11:20 AM  Clinical Narrative:  CSW followed up with Randine with [employer] and confirmed the [employer] wound VAC was approved by patient's insurance.CSW confirmed with Rosina with [facility] that patient will not need HHRN for wound VAC changes as they will be doing them in the office. Wound VAC to be delivered to patient's room. CSW updated patient and RN. TOC signing off.  Final next level of care: Home/Self Care Barriers to Discharge: No Barriers Identified  Patient Goals and CMS Choice Patient states their goals for this hospitalization and ongoing recovery are:: Discharge home CMS Medicare.gov Compare Post Acute Care list provided to:: Patient Choice offered to / list presented to : Patient  Discharge Plan and Services Additional resources added to the After Visit Summary for          DME Arranged: Vac DME Agency: [employer] Date DME Agency Contacted: 10/05/23 Time DME Agency Contacted: 1101 Representative spoke with at DME Agency: Randine  Social Drivers of Health (SDOH) Interventions SDOH Screenings   Food Insecurity: No Food Insecurity (10/03/2023)  Housing: Low Risk  (10/03/2023)  Transportation Needs: No Transportation Needs (10/03/2023)  Utilities: Not At Risk (10/03/2023)  Alcohol Screen: Low Risk  (06/26/2023)  Depression (PHQ2-9): Low Risk  (06/29/2023)  Financial Resource Strain: Low Risk  (06/26/2023)  Physical Activity: Inactive (06/26/2023)  Social Connections: Moderately Isolated (10/03/2023)  Stress: No Stress Concern Present (06/26/2023)  Tobacco Use: Low Risk  (10/03/2023)  Health Literacy: Adequate Health Literacy (06/29/2023)   Readmission Risk Interventions    02/22/2023    2:16 PM  Readmission Risk Prevention Plan  Post Dischage Appt Complete  Medication  Screening Complete  Transportation Screening Complete

## 2023-10-05 NOTE — Progress Notes (Signed)
 Patient discharged to home w/ family. Given all belongings, instructions, equipment. Wound vac changed to home unit, education complete on wound vac. Patient verbalized understanding of all instructions. Escorted to pov via w/c.

## 2023-10-06 ENCOUNTER — Telehealth: Payer: Self-pay | Admitting: *Deleted

## 2023-10-06 NOTE — Transitions of Care (Post Inpatient/ED Visit) (Signed)
 10/06/2023  Name: [name] [name] MRN: [id number] DOB: 04-18-1951  Today's TOC FU Call Status: Today's TOC FU Call Status:: Successful TOC FU Call Completed TOC FU Call Complete Date: 10/06/23 Patient's Name and Date of Birth confirmed.  Transition Care Management Follow-up Telephone Call Date of Discharge: 10/05/23 Discharge Facility: [facility]) Type of Discharge: Inpatient Admission Primary Inpatient Discharge Diagnosis:: surgical I/D (R) knee secondary to pre-patellar How have you been since you were released from the hospital?: Better (I am doing just fine!  Things are going great- not having any pain or discomfort; able to walk around my house without using the cane or the walker.  The wound vac has not been a problem.  I will go to the ortho doctor on 10/11/23) Any questions or concerns?: No  Items Reviewed: Did you receive and understand the discharge instructions provided?: Yes (thoroughly reviewed with patient who verbalizes good understanding of same) Medications obtained,verified, and reconciled?: Yes (Medications Reviewed) (Full medication reconciliation/ review completed; no concerns or discrepancies identified; confirmed patient obtained/ is taking all newly Rx'd medications as instructed; self-manages medications and denies questions/ concerns around medications today) Any new allergies since your discharge?: No Dietary orders reviewed?: Yes Type of Diet Ordered:: As Healthy as possible; low salt Do you have support at home?: Yes People in Home: spouse Name of Support/Comfort Primary Source: Reports independent in self-care activities; supportive spouse assists as/ if needed/ indicated  Medications Reviewed Today: Medications Reviewed Today     Reviewed by Ryland Tungate M, RN (Registered Nurse) on 10/06/23 at 1239  Med List Status: <None>   Medication Order Taking? Sig Documenting Provider Last Dose Status Informant  calcium  carbonate (TUMS - DOSED IN MG  ELEMENTAL CALCIUM ) 500 MG chewable tablet [id number] Yes Chew 2 tablets by mouth 2 (two) times daily as needed for indigestion or heartburn. [provider] Taking Active Self           Med Note CHRISTIE ALYSON Kitchens May 15, 2023  9:52 AM)    cefadroxil  (DURICEF) 500 MG capsule [id number] Yes Take 1 capsule (500 mg total) by mouth 2 (two) times daily. Patti Rosina SAUNDERS, PA-C Taking Active   gabapentin  (NEURONTIN ) 300 MG capsule [id number] Yes Take 300 mg by mouth 2 (two) times daily. [provider] Taking Active Self  Ketotifen Fumarate (ALLERGY EYE DROPS OP) [id number] Yes Place 1 drop into both eyes daily as needed (red/itchy eyes). [provider] Taking Active Self  methocarbamol  (ROBAXIN ) 500 MG tablet [id number] Yes Take 1 tablet (500 mg total) by mouth every 6 (six) hours as needed for muscle spasms (muscle pain). Patti Rosina SAUNDERS, PA-C Taking Active            Med Note (Adna Nofziger M   Fri Oct 06, 2023 12:39 PM) 10/06/23: Reports during TOC call, has if needed- but has not needed; denies pain/ muscle spasm  mupirocin  ointment (BACTROBAN ) 2 % [id number] Yes Place 1 Application into the nose 2 (two) times daily for 60 doses. Use as directed 2 times daily for 5 days every other week for 6 weeks. Patti Rosina SAUNDERS, PA-C Taking Active   naproxen  (NAPROSYN ) 500 MG tablet [id number] Yes Take 500 mg by mouth daily. [provider] Taking Active Self  oxyCODONE  (OXY IR/ROXICODONE ) 5 MG immediate release tablet [id number] Yes Take 1 tablet (5 mg total) by mouth every 4 (four) hours as needed for severe pain (pain score 7-10). Patti Rosina SAUNDERS, PA-C Taking Active  Med Note (Raydan Schlabach M   Fri Oct 06, 2023 12:39 PM) 10/06/23: Reports during TOC call, has if needed- but has not needed; denies pain/ muscle spasm   polyethylene glycol (MIRALAX  / GLYCOLAX ) 17 g packet [id number] Yes Take 17 g by mouth 2 (two) times daily.  Patient taking differently: Take 17 g by  mouth daily as needed for mild constipation or moderate constipation.   Patti Rosina SAUNDERS, PA-C Taking Active Self  saccharomyces boulardii (FLORASTOR) 250 MG capsule [id number] Yes Take 250 mg by mouth daily. [provider] Taking Active Self  sertraline  (ZOLOFT ) 100 MG tablet [id number] Yes Take 1 tablet (100 mg total) by mouth daily. Rollene Almarie LABOR, MD Taking Active Self  sodium chloride  (OCEAN) 0.65 % SOLN nasal spray [id number] Yes Place 1 spray into both nostrils daily as needed for congestion. [provider] Taking Active Self  tamsulosin  (FLOMAX ) 0.4 MG CAPS capsule [id number] Yes Take 0.4 mg by mouth at bedtime. [provider] Taking Active Self           Med Note ADELAIDA, LISA L   Sat Dec 20, 2014  5:27 PM)    telmisartan  (MICARDIS ) 80 MG tablet [id number] Yes Take 1 tablet (80 mg total) by mouth daily. Rollene Almarie LABOR, MD Taking Active Self           Home Care and Equipment/Supplies: Were Home Health Services Ordered?: No Any new equipment or medical supplies ordered?: Yes Name of Medical supply agency?: wound vac provided at time of hospital discharge by [facility] Were you able to get the equipment/medical supplies?: Yes Do you have any questions related to the use of the equipment/supplies?: No  Functional Questionnaire: Do you need assistance with bathing/showering or dressing?: No Do you need assistance with meal preparation?: No Do you need assistance with eating?: No Do you have difficulty maintaining continence: No Do you need assistance with getting out of bed/getting out of a chair/moving?: No Do you have difficulty managing or taking your medications?: No  Follow up appointments reviewed: PCP Follow-up appointment confirmed?: NA Specialist Hospital Follow-up appointment confirmed?: Yes Date of Specialist follow-up appointment?: 10/11/23 Follow-Up Specialty Provider:: orthopedic surgeon- Dr. Ernie Do you need transportation to your  follow-up appointment?: No Do you understand care options if your condition(s) worsen?: Yes-patient verbalized understanding  SDOH Interventions Today    Flowsheet Row Most Recent Value  SDOH Interventions   Food Insecurity Interventions Intervention Not Indicated  Housing Interventions Intervention Not Indicated  Transportation Interventions Intervention Not Indicated  [reports spouse is providing transportation after recent (R) knee surgical I/D]  Utilities Interventions Intervention Not Indicated      Interventions Today    Flowsheet Row Most Recent Value  Chronic Disease   Chronic disease during today's visit Other  [surgical I/D (R) knee with wound vac- wound vac maintenace to be performed by outpatient orthopedic team]  General Interventions   General Interventions Discussed/Reviewed General Interventions Discussed, Durable Medical Equipment (DME), Doctor Visits  Doctor Visits Discussed/Reviewed Doctor Visits Discussed, Specialist  Durable Medical Equipment (DME) Other, Vannie salmons wound vac in place- maintenance will be performed by orthopedic team as outpatient]  PCP/Specialist Visits Compliance with follow-up visit  Education Interventions   Education Provided Provided Education  Provided Verbal Education On Medication  [management of pain medication- pain assessment completed: he denies pain today]  Nutrition Interventions   Nutrition Discussed/Reviewed Nutrition Discussed  Pharmacy Interventions   Pharmacy Dicussed/Reviewed Pharmacy Topics Discussed  [Full medication review with updating  medication list in EHR per patient report]  Safety Interventions   Safety Discussed/Reviewed Safety Discussed, Fall Risk       TOC Interventions Today    Flowsheet Row Most Recent Value  TOC Interventions   TOC Interventions Discussed/Reviewed TOC Interventions Discussed, Post op wound/incision care      Phylisha Dix Mckinney Pablo Stauffer, RN, BSN, Media Planner   Transitions of Care  [facility]  [place] [phone]: direct office

## 2023-10-13 NOTE — Discharge Summary (Signed)
 Patient ID: [name] [name] MRN: [id number] DOB/AGE: 1951-08-10 72 y.o.  Admit date: 10/03/2023 Discharge date: 10/05/2023  Admission Diagnoses:  Postoperative seroma of right knee   Discharge Diagnoses:  Principal Problem:   Postoperative seroma of subcutaneous tissue after non-dermatologic procedure Active Problems:   Postoperative seroma of musculoskeletal structure after musculoskeletal procedure   Past Medical History:  Diagnosis Date   Arthritis    Bronchitis    Depression    GERD (gastroesophageal reflux disease)    Hypertension    Skin cancer     Surgeries: Procedure(s): IRRIGATION AND DEBRIDEMENT KNEE APPLICATION OF WOUND VAC on 10/03/2023   Consultants:   Discharged Condition: Improved  Hospital Course: [name] [name] is an 73 y.o. male who was admitted 10/03/2023 for operative treatment ofPostoperative seroma of subcutaneous tissue after non-dermatologic procedure. Patient has severe unremitting pain that affects sleep, daily activities, and work/hobbies. After pre-op clearance the patient was taken to the operating room on 10/03/2023 and underwent  Procedure(s): IRRIGATION AND DEBRIDEMENT KNEE APPLICATION OF WOUND VAC.    Patient was given perioperative antibiotics:  Anti-infectives (From admission, onward)    Start     Dose/Rate Route Frequency Ordered Stop   10/05/23 0000  cefadroxil  (DURICEF) 500 MG capsule        500 mg Oral 2 times daily 10/05/23 0913 11/04/23 2359   10/04/23 1000  cefadroxil  (DURICEF) capsule 500 mg  Status:  Discontinued        500 mg Oral 2 times daily 10/04/23 0835 10/05/23 1757   10/03/23 2100  ceFAZolin  (ANCEF ) IVPB 2g/100 mL premix        2 g 200 mL/hr over 30 Minutes Intravenous Every 6 hours 10/03/23 1720 10/04/23 0308   10/03/23 1528  vancomycin  (VANCOCIN ) powder  Status:  Discontinued          As needed 10/03/23 1528 10/03/23 1718   10/03/23 1415  ceFAZolin  (ANCEF ) IVPB 2g/100 mL premix        2 g 200 mL/hr over 30  Minutes Intravenous On call to O.R. 10/03/23 1411 10/03/23 1502        Patient was given sequential compression devices, early ambulation, and chemoprophylaxis to prevent DVT. Wound vac arranged for home. Patient worked with PT and was meeting their goals regarding safe ambulation and transfers.  Patient benefited maximally from hospital stay and there were no complications.    Recent vital signs: No data found.   Recent laboratory studies: No results for input(s): WBC, HGB, HCT, PLT, NA, K, CL, CO2, BUN, CREATININE, GLUCOSE, INR, CALCIUM  in the last 72 hours.  Invalid input(s): PT, 2   Discharge Medications:   Allergies as of 10/05/2023       Reactions   Avapro  [irbesartan ] Cough   Chlorhexidine  Rash   Reaction to PICC line dressing with Chlorhexidine  gel Not allergic to Chlorhexidine  soap   Lexapro [escitalopram Oxalate] Anxiety   insomnia        Medication List     TAKE these medications    ALLERGY EYE DROPS OP Place 1 drop into both eyes daily as needed (red/itchy eyes).   calcium  carbonate 500 MG chewable tablet Commonly known as: TUMS - dosed in mg elemental calcium  Chew 2 tablets by mouth 2 (two) times daily as needed for indigestion or heartburn.   cefadroxil  500 MG capsule Commonly known as: DURICEF Take 1 capsule (500 mg total) by mouth 2 (two) times daily.   gabapentin  300 MG capsule Commonly known as: NEURONTIN  Take 300  mg by mouth 2 (two) times daily.   methocarbamol  500 MG tablet Commonly known as: ROBAXIN  Take 1 tablet (500 mg total) by mouth every 6 (six) hours as needed for muscle spasms (muscle pain).   mupirocin  ointment 2 % Commonly known as: BACTROBAN  Place 1 Application into the nose 2 (two) times daily for 60 doses. Use as directed 2 times daily for 5 days every other week for 6 weeks.   naproxen  500 MG tablet Commonly known as: NAPROSYN  Take 500 mg by mouth daily.   oxyCODONE  5 MG immediate release  tablet Commonly known as: Oxy IR/ROXICODONE  Take 1 tablet (5 mg total) by mouth every 4 (four) hours as needed for severe pain (pain score 7-10).   polyethylene glycol 17 g packet Commonly known as: MIRALAX  / GLYCOLAX  Take 17 g by mouth 2 (two) times daily.   saccharomyces boulardii 250 MG capsule Commonly known as: FLORASTOR Take 250 mg by mouth daily.   sertraline  100 MG tablet Commonly known as: ZOLOFT  Take 1 tablet (100 mg total) by mouth daily.   sodium chloride  0.65 % Soln nasal spray Commonly known as: OCEAN Place 1 spray into both nostrils daily as needed for congestion.   tamsulosin  0.4 MG Caps capsule Commonly known as: FLOMAX  Take 0.4 mg by mouth at bedtime.   telmisartan  80 MG tablet Commonly known as: MICARDIS  Take 1 tablet (80 mg total) by mouth daily.        Diagnostic Studies: No results found.  Disposition: Discharge disposition: 01-Home or Self Care       Discharge Instructions     Call MD / Call 911   Complete by: As directed    If you experience chest pain or shortness of breath, CALL 911 and be transported to the hospital emergency room.  If you develope a fever above 101 F, pus (white drainage) or increased drainage or redness at the wound, or calf pain, call your surgeon's office.   Constipation Prevention   Complete by: As directed    Drink plenty of fluids.  Prune juice may be helpful.  You may use a stool softener, such as Colace (over the counter) 100 mg twice a day.  Use MiraLax  (over the counter) for constipation as needed.   Diet - low sodium heart healthy   Complete by: As directed    Increase activity slowly as tolerated   Complete by: As directed    Weight bearing as tolerated with assist device (walker, cane, etc) as directed, use it as long as suggested by your surgeon or therapist, typically at least 4-6 weeks.   Post-operative opioid taper instructions:   Complete by: As directed    POST-OPERATIVE OPIOID TAPER  INSTRUCTIONS: It is important to wean off of your opioid medication as soon as possible. If you do not need pain medication after your surgery it is ok to stop day one. Opioids include: Codeine, Hydrocodone (Norco, Vicodin), Oxycodone (Percocet, oxycontin ) and hydromorphone  amongst others.  Long term and even short term use of opiods can cause: Increased pain response Dependence Constipation Depression Respiratory depression And more.  Withdrawal symptoms can include Flu like symptoms Nausea, vomiting And more Techniques to manage these symptoms Hydrate well Eat regular healthy meals Stay active Use relaxation techniques(deep breathing, meditating, yoga) Do Not substitute Alcohol to help with tapering If you have been on opioids for less than two weeks and do not have pain than it is ok to stop all together.  Plan to wean off of opioids This  plan should start within one week post op of your joint replacement. Maintain the same interval or time between taking each dose and first decrease the dose.  Cut the total daily intake of opioids by one tablet each day Next start to increase the time between doses. The last dose that should be eliminated is the evening dose.           Follow-up Information     Ernie Cough, MD. Schedule an appointment as soon as possible for a visit in 2 week(s).   Specialty: Orthopedic Surgery Contact information: [address] [place] [address] [place] [address] [phone]         [employer] [employer] [facility] Follow up.   Why: [employer] will be proviing the [employer] wound VAC for home use.                 Signed: Rosina JONELLE [name] 10/13/2023, 1:58 PM

## 2023-11-02 NOTE — Progress Notes (Signed)
Surgery orders requested via Epic inbox.

## 2023-11-05 DIAGNOSIS — S81001A Unspecified open wound, right knee, initial encounter: Secondary | ICD-10-CM | POA: Diagnosis not present

## 2023-11-05 DIAGNOSIS — T8189XA Other complications of procedures, not elsewhere classified, initial encounter: Secondary | ICD-10-CM | POA: Diagnosis not present

## 2023-11-07 ENCOUNTER — Ambulatory Visit (HOSPITAL_COMMUNITY)
Admission: EM | Admit: 2023-11-07 | Discharge: 2023-11-07 | Disposition: A | Discharge status: Home / Self Care | Payer: Medicare Other | Attending: Family Medicine | Admitting: Family Medicine

## 2023-11-07 ENCOUNTER — Encounter (HOSPITAL_COMMUNITY): Payer: Self-pay

## 2023-11-07 ENCOUNTER — Ambulatory Visit (INDEPENDENT_AMBULATORY_CARE_PROVIDER_SITE_OTHER): Payer: Medicare Other

## 2023-11-07 DIAGNOSIS — R051 Acute cough: Secondary | ICD-10-CM

## 2023-11-07 DIAGNOSIS — R059 Cough, unspecified: Secondary | ICD-10-CM | POA: Diagnosis not present

## 2023-11-07 DIAGNOSIS — J069 Acute upper respiratory infection, unspecified: Secondary | ICD-10-CM

## 2023-11-07 MED ORDER — BENZONATATE 200 MG PO CAPS: 200.0000 mg | ORAL_CAPSULE | Freq: Three times a day (TID) | ORAL | 0 refills | Status: DC | PRN

## 2023-11-07 NOTE — ED Triage Notes (Signed)
Patient here today with c/o cough, nasal congestion, and fatigue since Friday. He has taken Mucinex D with some relief. His wife and grandson has also been sick with fever.

## 2023-11-07 NOTE — Discharge Instructions (Signed)
You were seen today for upper respiratory symptoms.  Your chest xray appears normal.  I have sent out a medication to help with cough.  Please return if not improving or worsening.

## 2023-11-07 NOTE — ED Provider Notes (Addendum)
 [facility]    CSN: [id number] Arrival date & time: 11/07/23  0902      History   Chief Complaint Chief Complaint  Patient presents with   Cough    HPI [name] [name] is a 73 y.o. male.    Cough Associated symptoms: rhinorrhea   Associated symptoms: no chills, no fever, no shortness of breath and no wheezing    Patient is here for URI x 5 days. He was his grandson that was recently dx with pneumonia.  He has a cough, sinus congestion and drainage.  No fevers, no headache.  No n/v.  No wheezing or sob.  He is taking cefadroxil  bid for chronic knee joint infection.  He has a wound vac in place, scheduled for another surgery soon.        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   Infection of right knee (HCC) 11/14/2022   Chronic sinusitis 09/02/2022   Encounter for general adult medical examination with abnormal findings 07/09/2021   BPH (benign prostatic hyperplasia) 07/09/2021   MDD (major depressive disorder), recurrent, in full remission (HCC) 07/09/2021   Essential hypertension 10/20/2008    Past Surgical History:  Procedure Laterality Date   APPLICATION OF WOUND VAC Right 10/03/2023   Procedure: APPLICATION OF WOUND VAC;  Surgeon: Ernie Cough, MD;  Location: [facility];  Service: Orthopedics;  Laterality: Right;   CHOLECYSTECTOMY N/A 02/10/2021   Procedure: LAPAROSCOPIC CHOLECYSTECTOMY;  Surgeon: Vanderbilt Ned, MD;  Location: [facility];  Service: General;   Laterality: N/A;   EXCISIONAL TOTAL KNEE ARTHROPLASTY WITH ANTIBIOTIC SPACERS Right 02/21/2023   Procedure: EXCISIONAL TOTAL KNEE ARTHROPLASTY WITH ANTIBIOTIC SPACERS;  Surgeon: Ernie Cough, MD;  Location: [facility];  Service: Orthopedics;  Laterality: Right;   HERNIA REPAIR     I & D KNEE WITH POLY EXCHANGE Right 11/15/2022   Procedure: IRRIGATION AND DEBRIDEMENT KNEE WITH  POLY EXCHANGE;  Surgeon: Gerome Charleston, MD;  Location: [facility];  Service: Orthopedics;  Laterality: Right;  adductor canal, no antibiotics prior to culture add on room to follow other case 61   IRRIGATION AND DEBRIDEMENT KNEE Right 10/03/2023   Procedure: IRRIGATION AND DEBRIDEMENT KNEE;  Surgeon: Ernie Cough, MD;  Location: [facility];  Service: Orthopedics;  Laterality: Right;  1 HR   Knee Surgery Right 10/25/2022   REIMPLANTATION OF TOTAL KNEE Right 06/01/2023   Procedure: REIMPLANTATION/REVISION OF TOTAL KNEE;  Surgeon: Ernie Cough, MD;  Location: [facility];  Service: Orthopedics;  Laterality: Right;   ROTATOR CUFF REPAIR     SKIN CANCER EXCISION         Home Medications    Prior to Admission medications   Medication Sig Start Date End Date Taking? Authorizing Provider  calcium  carbonate (TUMS - DOSED IN MG ELEMENTAL CALCIUM ) 500 MG chewable tablet Chew 2 tablets by mouth 2 (two) times daily as needed for indigestion or heartburn.    [provider]  cefadroxil  (DURICEF) 500 MG capsule Take 500 mg by mouth  2 (two) times daily. 11/05/23   [provider]  gabapentin  (NEURONTIN ) 300 MG capsule Take 300 mg by mouth 2 (two) times daily.    [provider]  Ketotifen Fumarate (ALLERGY EYE DROPS OP) Place 1 drop into both eyes daily as needed (red/itchy eyes).    [provider]  naproxen  (NAPROSYN ) 500 MG tablet Take 500 mg by mouth daily.    [provider]  oxyCODONE  (OXY IR/ROXICODONE ) 5 MG immediate release tablet Take 1 tablet (5 mg total) by mouth every 4 (four) hours as  needed for severe pain (pain score 7-10). 10/05/23   Patti Rosina SAUNDERS, PA-C  polyethylene glycol (MIRALAX  / GLYCOLAX ) 17 g packet Take 17 g by mouth 2 (two) times daily. Patient taking differently: Take 17 g by mouth daily as needed for mild constipation or moderate constipation. 02/23/23   Patti Rosina SAUNDERS, PA-C  saccharomyces boulardii (FLORASTOR) 250 MG capsule Take 250 mg by mouth daily.    [provider]  sertraline  (ZOLOFT ) 100 MG tablet Take 1 tablet (100 mg total) by mouth daily. 08/15/23   Rollene Almarie LABOR, MD  sodium chloride  (OCEAN) 0.65 % SOLN nasal spray Place 1 spray into both nostrils daily as needed for congestion.    [provider]  tamsulosin  (FLOMAX ) 0.4 MG CAPS capsule Take 0.4 mg by mouth at bedtime. 12/04/14   [provider]  telmisartan  (MICARDIS ) 80 MG tablet Take 1 tablet (80 mg total) by mouth daily. 08/15/23   Rollene Almarie LABOR, MD    Family History Family History  Problem Relation Age of Onset   Emphysema Mother        heart failure indusce emphysema   Heart disease Father    Heart attack Father    Heart disease Sister 38       [id number]    Social History Social History   Tobacco Use   Smoking status: Never   Smokeless tobacco: Never  Vaping Use   Vaping status: Never Used  Substance Use Topics   Alcohol use: Yes    Comment: socially   Drug use: No     Allergies   Avapro  [irbesartan ], Chlorhexidine , and Lexapro [escitalopram oxalate]   Review of Systems Review of Systems  Constitutional:  Negative for chills and fever.  HENT:  Positive for congestion and rhinorrhea.   Respiratory:  Positive for cough. Negative for shortness of breath and wheezing.   Cardiovascular: Negative.   Gastrointestinal: Negative.   Musculoskeletal: Negative.   Psychiatric/Behavioral: Negative.       Physical Exam Triage Vital Signs ED Triage Vitals [11/07/23 1119]  Encounter Vitals Group     BP 125/80     Systolic BP  Percentile      Diastolic BP Percentile      Pulse Rate 98     Resp 16     Temp 99.7 F (37.6 C)     Temp Source Oral     SpO2 95 %     Weight 230 lb (104.3 kg)     Height 5' 9 (1.753 m)     Head Circumference      Peak Flow      Pain Score 7     Pain Loc      Pain Education      Exclude from Growth Chart    No data found.  Updated Vital Signs BP 125/80 (BP Location: Right Arm)   Pulse 98   Temp 99.7 F (37.6 C) (Oral)  Resp 16   Ht 5' 9 (1.753 m)   Wt 104.3 kg   SpO2 95%   BMI 33.97 kg/m   Visual Acuity Right Eye Distance:   Left Eye Distance:   Bilateral Distance:    Right Eye Near:   Left Eye Near:    Bilateral Near:     Physical Exam Constitutional:      Appearance: Normal appearance.  HENT:     Nose: Congestion present. No rhinorrhea.     Right Sinus: No maxillary sinus tenderness or frontal sinus tenderness.     Left Sinus: No maxillary sinus tenderness or frontal sinus tenderness.  Cardiovascular:     Rate and Rhythm: Normal rate and regular rhythm.  Pulmonary:     Effort: Pulmonary effort is normal.     Breath sounds: Normal breath sounds.  Musculoskeletal:     Cervical back: Normal range of motion and neck supple. No tenderness.  Skin:    General: Skin is warm.  Neurological:     General: No focal deficit present.     Mental Status: He is alert.  Psychiatric:        Mood and Affect: Mood normal.      UC Treatments / Results  Labs (all labs ordered are listed, but only abnormal results are displayed) Labs Reviewed - No data to display  EKG   Radiology DG Chest 2 View Result Date: 11/07/2023 CLINICAL DATA:  Cough. EXAM: CHEST - 2 VIEW COMPARISON:  Chest radiograph dated 09/02/2022. FINDINGS: No focal consolidation, pleural effusion, or pneumothorax. The cardiac silhouette is within normal limits. No acute osseous pathology. IMPRESSION: No active cardiopulmonary disease. Electronically Signed   By: Vanetta Chou M.D.   On:  11/07/2023 12:15    Procedures Procedures (including critical care time)  Medications Ordered in UC Medications - No data to display  Initial Impression / Assessment and Plan / UC Course  I have reviewed the triage vital signs and the nursing notes.  Pertinent labs & imaging results that were available during my care of the patient were reviewed by me and considered in my medical decision making (see chart for details).   Final Clinical Impressions(s) / UC Diagnoses   Final diagnoses:  Acute cough  Viral URI with cough     Discharge Instructions      You were seen today for upper respiratory symptoms.  Your chest xray appears normal.  I have sent out a medication to help with cough.  Please return if not improving or worsening.     ED Prescriptions     Medication Sig Dispense Auth. Provider   benzonatate  (TESSALON ) 200 MG capsule Take 1 capsule (200 mg total) by mouth 3 (three) times daily as needed for cough. 21 capsule Darral Longs, MD      PDMP not reviewed this encounter.   Darral Longs, MD 11/07/23 8790    Darral Longs, MD 11/07/23 1218

## 2023-11-08 NOTE — Patient Instructions (Addendum)
 SURGICAL WAITING ROOM VISITATION Patients having surgery or a procedure may have no more than 2 support people in the waiting area - these visitors may rotate in the visitor waiting room.   Due to an increase in RSV and influenza rates and associated hospitalizations, children ages 72 and under may not visit patients in [facility] hospitals. If the patient needs to stay at the hospital during part of their recovery, the visitor guidelines for inpatient rooms apply.  PRE-OP VISITATION  Pre-op nurse will coordinate an appropriate time for 1 support person to accompany the patient in pre-op.  This support person may not rotate.  This visitor will be contacted when the time is appropriate for the visitor to come back in the pre-op area.  Please refer to the [facility] website for the visitor guidelines for Inpatients (after your surgery is over and you are in a regular room).  You are not required to quarantine at this time prior to your surgery. However, you must do this: Hand Hygiene often Do NOT share personal items Notify your provider if you are in close contact with someone who has COVID or you develop fever 100.4 or greater, new onset of sneezing, cough, sore throat, shortness of breath or body aches.  If you test positive for Covid or have been in contact with anyone that has tested positive in the last 10 days please notify you surgeon.    Your procedure is scheduled on:  Tuesday  November 14, 2023  Report to [facility] Main Entrance: [address] entrance where the [employer] is available.   Report to admitting at: 1:15 PM  Call this number if you have any questions or problems the morning of surgery [phone]  Do not eat food after Midnight the night prior to your surgery/procedure.  After Midnight you may have the following liquids until   12:30 PM DAY OF SURGERY  Clear Liquid Diet Water  Black Coffee (sugar ok, NO MILK/CREAM OR CREAMERS)  Tea (sugar ok, NO  MILK/CREAM OR CREAMERS) regular and decaf                             Plain Jell-O  with no fruit (NO RED)                                           Fruit ices (not with fruit pulp, NO RED)                                     Popsicles (NO RED)                                                                  Juice: NO CITRUS JUICES: only apple, WHITE grape, WHITE cranberry Sports drinks like Gatorade or Powerade (NO RED)                   The day of surgery:  Drink ONE (1) Pre-Surgery Clear Ensure at  12:30 PM the morning of surgery. Drink in  one sitting. Do not sip.  This drink was given to you during your hospital pre-op appointment visit. Nothing else to drink after completing the Pre-Surgery Clear Ensure : No candy, chewing gum or throat lozenges.    FOLLOW ANY ADDITIONAL PRE OP INSTRUCTIONS YOU RECEIVED FROM YOUR SURGEON'S OFFICE!!!   Oral Hygiene is also important to reduce your risk of infection.        Remember - BRUSH YOUR TEETH THE MORNING OF SURGERY WITH YOUR REGULAR TOOTHPASTE  Do NOT smoke after Midnight the night before surgery.  STOP TAKING all Vitamins, Herbs and supplements 1 week before your surgery.   Take ONLY these medicines the morning of surgery with A SIP OF WATER : cefadroxil  (Duricef), sertraline  (Zoloft ), gabapentin .  You may take EITHER Tylenol  OR Oxycodone  if needed for pain.                     You may not have any metal on your body including , jewelry, and body piercing  Do not wear  lotions, powders, cologne, or deodorant  Men may shave face and neck.  Contacts, Hearing Aids, dentures or bridgework may not be worn into surgery. DENTURES WILL BE REMOVED PRIOR TO SURGERY PLEASE DO NOT APPLY Poly grip OR ADHESIVES!!!  You may bring a small overnight bag with you on the day of surgery, only pack items that are not valuable. [facility] IS NOT RESPONSIBLE   FOR VALUABLES THAT ARE LOST OR STOLEN.   Do not bring your home medications to the hospital.  The Pharmacy will dispense medications listed on your medication list to you during your admission in the Hospital.  Please read over the following fact sheets you were given: IF YOU HAVE QUESTIONS ABOUT YOUR PRE-OP INSTRUCTIONS, PLEASE CALL [phone]   [facility] - Preparing for Surgery Before surgery, you can play an important role.  Because skin is not sterile, your skin needs to be as free of germs as possible.  You can reduce the number of germs on your skin by washing with CHG (chlorahexidine gluconate) soap before surgery.  CHG is an antiseptic cleaner which kills germs and bonds with the skin to continue killing germs even after washing. Please DO NOT use if you have an allergy to CHG or antibacterial soaps.  If your skin becomes reddened/irritated stop using the CHG and inform your nurse when you arrive at Short Stay. Do not shave (including legs and underarms) for at least 48 hours prior to the first CHG shower.  You may shave your face/neck.  Please follow these instructions carefully:  1.  Shower with CHG Soap the night before surgery and the  morning of surgery.  2.  If you choose to wash your hair, wash your hair first as usual with your normal  shampoo.  3.  After you shampoo, rinse your hair and body thoroughly to remove the shampoo.                             4.  Use CHG as you would any other liquid soap.  You can apply chg directly to the skin and wash.  Gently with a scrungie or clean washcloth.  5.  Apply the CHG Soap to your body ONLY FROM THE NECK DOWN.   Do not use on face/ open  Wound or open sores. Avoid contact with eyes, ears mouth and genitals (private parts).                       Wash face,  Genitals (private parts) with your normal soap.             6.  Wash thoroughly, paying special attention to the area where your  surgery  will be performed.  7.  Thoroughly rinse your body with warm water  from the neck down.  8.  DO NOT shower/wash  with your normal soap after using and rinsing off the CHG Soap.            9.  Pat yourself dry with a clean towel.            10.  Wear clean pajamas.            11.  Place clean sheets on your bed the night of your first shower and do not  sleep with pets.  ON THE DAY OF SURGERY : Do not apply any lotions/deodorants the morning of surgery.  Please wear clean clothes to the hospital/surgery center.     FAILURE TO FOLLOW THESE INSTRUCTIONS MAY RESULT IN THE CANCELLATION OF YOUR SURGERY  PATIENT SIGNATURE_________________________________  NURSE SIGNATURE__________________________________  ________________________________________________________________________      Nasario Exon    An incentive spirometer is a tool that can help keep your lungs clear and active. This tool measures how well you are filling your lungs with each breath. Taking long deep breaths may help reverse or decrease the chance of developing breathing (pulmonary) problems (especially infection) following: A long period of time when you are unable to move or be active. BEFORE THE PROCEDURE  If the spirometer includes an indicator to show your best effort, your nurse or respiratory therapist will set it to a desired goal. If possible, sit up straight or lean slightly forward. Try not to slouch. Hold the incentive spirometer in an upright position. INSTRUCTIONS FOR USE  Sit on the edge of your bed if possible, or sit up as far as you can in bed or on a chair. Hold the incentive spirometer in an upright position. Breathe out normally. Place the mouthpiece in your mouth and seal your lips tightly around it. Breathe in slowly and as deeply as possible, raising the piston or the ball toward the top of the column. Hold your breath for 3-5 seconds or for as long as possible. Allow the piston or ball to fall to the bottom of the column. Remove the mouthpiece from your mouth and breathe out normally. Rest for a  few seconds and repeat Steps 1 through 7 at least 10 times every 1-2 hours when you are awake. Take your time and take a few normal breaths between deep breaths. The spirometer may include an indicator to show your best effort. Use the indicator as a goal to work toward during each repetition. After each set of 10 deep breaths, practice coughing to be sure your lungs are clear. If you have an incision (the cut made at the time of surgery), support your incision when coughing by placing a pillow or rolled up towels firmly against it. Once you are able to get out of bed, walk around indoors and cough well. You may stop using the incentive spirometer when instructed by your caregiver.  RISKS AND COMPLICATIONS Take your time so you do not get dizzy or light-headed. If you are  in pain, you may need to take or ask for pain medication before doing incentive spirometry. It is harder to take a deep breath if you are having pain. AFTER USE Rest and breathe slowly and easily. It can be helpful to keep track of a log of your progress. Your caregiver can provide you with a simple table to help with this. If you are using the spirometer at home, follow these instructions: SEEK MEDICAL CARE IF:  You are having difficultly using the spirometer. You have trouble using the spirometer as often as instructed. Your pain medication is not giving enough relief while using the spirometer. You develop fever of 100.5 F (38.1 C) or higher.                                                                                                    SEEK IMMEDIATE MEDICAL CARE IF:  You cough up bloody sputum that had not been present before. You develop fever of 102 F (38.9 C) or greater. You develop worsening pain at or near the incision site. MAKE SURE YOU:  Understand these instructions. Will watch your condition. Will get help right away if you are not doing well or get worse. Document Released: 01/30/2007 Document Revised:  12/12/2011 Document Reviewed: 04/02/2007 [facility] Patient Information 2014 [place], [place].

## 2023-11-08 NOTE — Progress Notes (Addendum)
 COVID Vaccine received:  []  No [x]  Yes Date of any COVID positive Test in last 90 days:  PCP - Almarie Cleveland, MD  Cardiologist - Annabella Scarce, MD  (LOV 2016 HLD) ID- Loney Stank, MD   Chest x-ray - 11-07-2023  2v Epic  EKG - 10-02-2023 Epic Stress Test -  ECHO -  Cardiac Cath -   PCR screen: []  Ordered & Completed []   No Order but Needs PROFEND     [x]   N/A for this surgery  Surgery Plan:  []  Ambulatory   [x]  Outpatient in bed  []  Admit Anesthesia:    []  General  [x]  Spinal  []   Choice []   MAC  Pacemaker / ICD device [x]  No []  Yes   Spinal Cord Stimulator:[x]  No []  Yes       History of Sleep Apnea? [x]  No []  Yes   CPAP used?- [x]  No []  Yes    Does the patient monitor blood sugar?   [x]  N/A   []  No []  Yes  Patient has: [x]  NO Hx DM   []  Pre-DM   []  DM1  []   DM2  Blood Thinner / Instructions:  none Aspirin  Instructions:  none  ERAS Protocol Ordered: []  No  [x]  Yes PRE-SURGERY [x]  ENSURE  []  G2    Patient is to be NPO after: 1230  Dental hx: []  Dentures:  [x]  N/A      []  Bridge or Partial:                   []  Loose or Damaged teeth:   Comments: Patient was seen at the UC on 11-04-2023 for cough, congestion. CXR was negative.  At PST, Patient is still sneezing and coughing but is Afebrile. He will call Dr. Ernie if anything else changes prior to DOS  Activity level: Patient is unable to climb a flight of stairs without difficulty; [x]  No CP  [x]  No SOB, but would have leg pain, Patient can perform ADLs without assistance.   Anesthesia review: HTN, MDD, GERD,   Patient denies shortness of breath, fever, cough and chest pain at PAT appointment.  Patient verbalized understanding and agreement to the Pre-Surgical Instructions that were given to them at this PAT appointment. Patient was also educated of the need to review these PAT instructions again prior to his surgery.I reviewed the appropriate phone numbers to call if they have any and questions or concerns.

## 2023-11-09 ENCOUNTER — Encounter (HOSPITAL_COMMUNITY)
Admission: RE | Admit: 2023-11-09 | Discharge: 2023-11-09 | Disposition: A | Discharge status: Home / Self Care | Payer: Medicare Other | Source: Ambulatory Visit | Attending: Orthopedic Surgery | Admitting: Orthopedic Surgery

## 2023-11-09 ENCOUNTER — Other Ambulatory Visit: Payer: Self-pay

## 2023-11-09 ENCOUNTER — Encounter (HOSPITAL_COMMUNITY): Payer: Self-pay

## 2023-11-09 VITALS — BP 112/68 | HR 88 | Temp 98.8°F | Resp 16 | Ht 69.0 in | Wt 229.3 lb

## 2023-11-09 DIAGNOSIS — Z01812 Encounter for preprocedural laboratory examination: Secondary | ICD-10-CM | POA: Insufficient documentation

## 2023-11-09 DIAGNOSIS — L7634 Postprocedural seroma of skin and subcutaneous tissue following other procedure: Secondary | ICD-10-CM | POA: Insufficient documentation

## 2023-11-09 DIAGNOSIS — I10 Essential (primary) hypertension: Secondary | ICD-10-CM | POA: Insufficient documentation

## 2023-11-09 DIAGNOSIS — Z01818 Encounter for other preprocedural examination: Secondary | ICD-10-CM

## 2023-11-09 LAB — CBC
HCT: 39.4 % (ref 39.0–52.0)
Hemoglobin: 12.4 g/dL — ABNORMAL LOW (ref 13.0–17.0)
MCH: 27.8 pg (ref 26.0–34.0)
MCHC: 31.5 g/dL (ref 30.0–36.0)
MCV: 88.3 fL (ref 80.0–100.0)
Platelets: 248 10*3/uL (ref 150–400)
RBC: 4.46 MIL/uL (ref 4.22–5.81)
RDW: 15.1 % (ref 11.5–15.5)
WBC: 3.8 10*3/uL — ABNORMAL LOW (ref 4.0–10.5)
nRBC: 0 % (ref 0.0–0.2)

## 2023-11-09 LAB — BASIC METABOLIC PANEL
Anion gap: 9 (ref 5–15)
BUN: 19 mg/dL (ref 8–23)
CO2: 26 mmol/L (ref 22–32)
Calcium: 8.8 mg/dL — ABNORMAL LOW (ref 8.9–10.3)
Chloride: 100 mmol/L (ref 98–111)
Creatinine, Ser: 0.8 mg/dL (ref 0.61–1.24)
GFR, Estimated: >60 mL/min (ref >60)
Glucose, Bld: 114 mg/dL — ABNORMAL HIGH (ref 70–99)
Potassium: 4.2 mmol/L (ref 3.5–5.1)
Sodium: 135 mmol/L (ref 135–145)

## 2023-11-14 ENCOUNTER — Ambulatory Visit (HOSPITAL_COMMUNITY): Payer: Medicare Other | Admitting: Anesthesiology

## 2023-11-14 ENCOUNTER — Other Ambulatory Visit: Payer: Self-pay

## 2023-11-14 ENCOUNTER — Encounter (HOSPITAL_COMMUNITY): Payer: Self-pay | Admitting: Orthopedic Surgery

## 2023-11-14 ENCOUNTER — Observation Stay (HOSPITAL_COMMUNITY)
Admission: RE | Admit: 2023-11-14 | Discharge: 2023-11-15 | Disposition: A | Discharge status: Home / Self Care | Payer: Medicare Other | Source: Ambulatory Visit | Attending: Orthopedic Surgery | Admitting: Orthopedic Surgery

## 2023-11-14 ENCOUNTER — Encounter (HOSPITAL_COMMUNITY)
Admission: RE | Disposition: A | Discharge status: Home / Self Care | Payer: Self-pay | Source: Ambulatory Visit | Attending: Orthopedic Surgery

## 2023-11-14 DIAGNOSIS — M96842 Postprocedural seroma of a musculoskeletal structure following a musculoskeletal system procedure: Secondary | ICD-10-CM | POA: Diagnosis present

## 2023-11-14 DIAGNOSIS — T8453XA Infection and inflammatory reaction due to internal right knee prosthesis, initial encounter: Secondary | ICD-10-CM | POA: Diagnosis not present

## 2023-11-14 DIAGNOSIS — L7634 Postprocedural seroma of skin and subcutaneous tissue following other procedure: Principal | ICD-10-CM | POA: Insufficient documentation

## 2023-11-14 DIAGNOSIS — I10 Essential (primary) hypertension: Secondary | ICD-10-CM | POA: Diagnosis not present

## 2023-11-14 DIAGNOSIS — Z96651 Presence of right artificial knee joint: Secondary | ICD-10-CM | POA: Insufficient documentation

## 2023-11-14 DIAGNOSIS — Z79899 Other long term (current) drug therapy: Secondary | ICD-10-CM | POA: Insufficient documentation

## 2023-11-14 DIAGNOSIS — Z85828 Personal history of other malignant neoplasm of skin: Secondary | ICD-10-CM | POA: Insufficient documentation

## 2023-11-14 DIAGNOSIS — S8001XA Contusion of right knee, initial encounter: Secondary | ICD-10-CM

## 2023-11-14 DIAGNOSIS — G8918 Other acute postprocedural pain: Secondary | ICD-10-CM | POA: Diagnosis not present

## 2023-11-14 HISTORY — PX: IRRIGATION AND DEBRIDEMENT KNEE: SHX5185

## 2023-11-14 SURGERY — IRRIGATION AND DEBRIDEMENT KNEE
Anesthesia: Regional | Site: Knee | Laterality: Right

## 2023-11-14 MED ORDER — 0.9 % SODIUM CHLORIDE (POUR BTL) OPTIME
TOPICAL | Status: DC | PRN
  Administered 2023-11-14: 1000 mL

## 2023-11-14 MED ORDER — CEFAZOLIN SODIUM-DEXTROSE 2-3 GM-%(50ML) IV SOLR
INTRAVENOUS | Status: DC | PRN
  Administered 2023-11-14: 2 g via INTRAVENOUS

## 2023-11-14 MED ORDER — LIDOCAINE HCL (PF) 2 % IJ SOLN
INTRAMUSCULAR | Status: AC
  Filled 2023-11-14: qty 5

## 2023-11-14 MED ORDER — POLYETHYLENE GLYCOL 3350 17 G PO PACK
17.0000 g | PACK | Freq: Two times a day (BID) | ORAL | Status: DC
  Administered 2023-11-14: 17 g via ORAL
  Filled 2023-11-14 (×2): qty 1

## 2023-11-14 MED ORDER — POVIDONE-IODINE 10 % EX SWAB: 2.0000 | Freq: Once | CUTANEOUS | Status: DC

## 2023-11-14 MED ORDER — SODIUM CHLORIDE 0.9% FLUSH: 3.0000 mL | Freq: Two times a day (BID) | INTRAVENOUS | Status: DC

## 2023-11-14 MED ORDER — METHOCARBAMOL 1000 MG/10ML IJ SOLN: 500.0000 mg | Freq: Four times a day (QID) | INTRAMUSCULAR | Status: DC | PRN

## 2023-11-14 MED ORDER — VANCOMYCIN HCL 1000 MG IV SOLR
INTRAVENOUS | Status: AC
  Filled 2023-11-14: qty 20

## 2023-11-14 MED ORDER — SODIUM CHLORIDE 0.9% FLUSH: 3.0000 mL | INTRAVENOUS | Status: DC | PRN

## 2023-11-14 MED ORDER — GLYCOPYRROLATE 0.2 MG/ML IJ SOLN
INTRAMUSCULAR | Status: AC
  Filled 2023-11-14: qty 1

## 2023-11-14 MED ORDER — CEFAZOLIN SODIUM-DEXTROSE 2-4 GM/100ML-% IV SOLN
2.0000 g | Freq: Four times a day (QID) | INTRAVENOUS | Status: AC
  Administered 2023-11-14 – 2023-11-15 (×2): 2 g via INTRAVENOUS
  Filled 2023-11-14 (×2): qty 100

## 2023-11-14 MED ORDER — ACETAMINOPHEN 10 MG/ML IV SOLN: 1000.0000 mg | Freq: Once | INTRAVENOUS | Status: DC | PRN

## 2023-11-14 MED ORDER — FENTANYL CITRATE PF 50 MCG/ML IJ SOSY: 25.0000 ug | PREFILLED_SYRINGE | INTRAMUSCULAR | Status: DC | PRN

## 2023-11-14 MED ORDER — METOCLOPRAMIDE HCL 5 MG PO TABS: 5.0000 mg | ORAL_TABLET | Freq: Three times a day (TID) | ORAL | Status: DC | PRN

## 2023-11-14 MED ORDER — SACCHAROMYCES BOULARDII 250 MG PO CAPS
250.0000 mg | ORAL_CAPSULE | Freq: Every day | ORAL | Status: DC
  Administered 2023-11-15: 250 mg via ORAL
  Filled 2023-11-14: qty 1

## 2023-11-14 MED ORDER — SODIUM CHLORIDE 0.9 % IV SOLN: INTRAVENOUS | Status: DC | PRN

## 2023-11-14 MED ORDER — MENTHOL 3 MG MT LOZG: 1.0000 | LOZENGE | OROMUCOSAL | Status: DC | PRN

## 2023-11-14 MED ORDER — ACETAMINOPHEN 500 MG PO TABS
ORAL_TABLET | ORAL | Status: AC
  Filled 2023-11-14: qty 2

## 2023-11-14 MED ORDER — PHENYLEPHRINE HCL (PRESSORS) 10 MG/ML IV SOLN
INTRAVENOUS | Status: AC
  Filled 2023-11-14: qty 1

## 2023-11-14 MED ORDER — PROPOFOL 10 MG/ML IV BOLUS
INTRAVENOUS | Status: AC
  Filled 2023-11-14: qty 20

## 2023-11-14 MED ORDER — ASPIRIN 81 MG PO CHEW
81.0000 mg | CHEWABLE_TABLET | Freq: Two times a day (BID) | ORAL | Status: DC
  Administered 2023-11-14 – 2023-11-15 (×2): 81 mg via ORAL
  Filled 2023-11-14 (×2): qty 1

## 2023-11-14 MED ORDER — FENTANYL CITRATE (PF) 100 MCG/2ML IJ SOLN
INTRAMUSCULAR | Status: AC
  Filled 2023-11-14: qty 2

## 2023-11-14 MED ORDER — GABAPENTIN 300 MG PO CAPS
300.0000 mg | ORAL_CAPSULE | Freq: Two times a day (BID) | ORAL | Status: DC
  Administered 2023-11-14 – 2023-11-15 (×2): 300 mg via ORAL
  Filled 2023-11-14 (×2): qty 1

## 2023-11-14 MED ORDER — SERTRALINE HCL 100 MG PO TABS
100.0000 mg | ORAL_TABLET | Freq: Every day | ORAL | Status: DC
  Administered 2023-11-15: 100 mg via ORAL
  Filled 2023-11-14: qty 1

## 2023-11-14 MED ORDER — TAMSULOSIN HCL 0.4 MG PO CAPS
0.4000 mg | ORAL_CAPSULE | Freq: Every day | ORAL | Status: DC
  Administered 2023-11-14: 0.4 mg via ORAL
  Filled 2023-11-14: qty 1

## 2023-11-14 MED ORDER — PROPOFOL 10 MG/ML IV BOLUS
INTRAVENOUS | Status: DC | PRN
Start: 2023-11-14 — End: 2023-11-14
  Administered 2023-11-14: 400 mg via INTRAVENOUS

## 2023-11-14 MED ORDER — CEFADROXIL 500 MG PO CAPS
500.0000 mg | ORAL_CAPSULE | Freq: Two times a day (BID) | ORAL | Status: DC
  Administered 2023-11-15: 500 mg via ORAL
  Filled 2023-11-14: qty 1

## 2023-11-14 MED ORDER — METOCLOPRAMIDE HCL 5 MG/ML IJ SOLN: 5.0000 mg | Freq: Three times a day (TID) | INTRAMUSCULAR | Status: DC | PRN

## 2023-11-14 MED ORDER — FENTANYL CITRATE PF 50 MCG/ML IJ SOSY
100.0000 ug | PREFILLED_SYRINGE | INTRAMUSCULAR | Status: DC
  Filled 2023-11-14: qty 2

## 2023-11-14 MED ORDER — CHLORHEXIDINE GLUCONATE 0.12 % MT SOLN
15.0000 mL | Freq: Once | OROMUCOSAL | Status: DC
Start: 2023-11-14 — End: 2023-11-14

## 2023-11-14 MED ORDER — DEXAMETHASONE SODIUM PHOSPHATE 10 MG/ML IJ SOLN
10.0000 mg | Freq: Once | INTRAMUSCULAR | Status: AC
  Administered 2023-11-15: 10 mg via INTRAVENOUS
  Filled 2023-11-14: qty 1

## 2023-11-14 MED ORDER — HYDROMORPHONE HCL 1 MG/ML IJ SOLN: 0.5000 mg | INTRAMUSCULAR | Status: DC | PRN

## 2023-11-14 MED ORDER — BISACODYL 10 MG RE SUPP: 10.0000 mg | Freq: Every day | RECTAL | Status: DC | PRN

## 2023-11-14 MED ORDER — PRONTOSAN WOUND IRRIGATION OPTIME
TOPICAL | Status: DC | PRN
  Administered 2023-11-14: 1 via TOPICAL

## 2023-11-14 MED ORDER — ACETAMINOPHEN 500 MG PO TABS
1000.0000 mg | ORAL_TABLET | Freq: Four times a day (QID) | ORAL | Status: DC
  Administered 2023-11-14 – 2023-11-15 (×3): 1000 mg via ORAL
  Filled 2023-11-14 (×2): qty 2

## 2023-11-14 MED ORDER — SENNA 8.6 MG PO TABS
2.0000 | ORAL_TABLET | Freq: Every day | ORAL | Status: DC
  Administered 2023-11-14: 17.2 mg via ORAL
  Filled 2023-11-14: qty 2

## 2023-11-14 MED ORDER — METHOCARBAMOL 500 MG PO TABS: 500.0000 mg | ORAL_TABLET | Freq: Four times a day (QID) | ORAL | Status: DC | PRN

## 2023-11-14 MED ORDER — DEXAMETHASONE SODIUM PHOSPHATE 4 MG/ML IJ SOLN
INTRAMUSCULAR | Status: DC | PRN
Start: 2023-11-14 — End: 2023-11-14
  Administered 2023-11-14: 8 mg via INTRAVENOUS

## 2023-11-14 MED ORDER — VANCOMYCIN HCL 1000 MG IV SOLR
INTRAVENOUS | Status: DC | PRN
  Administered 2023-11-14: 1000 mg

## 2023-11-14 MED ORDER — TRANEXAMIC ACID-NACL 1000-0.7 MG/100ML-% IV SOLN
1000.0000 mg | INTRAVENOUS | Status: AC
  Administered 2023-11-14: 1000 mg via INTRAVENOUS
  Filled 2023-11-14: qty 100

## 2023-11-14 MED ORDER — DEXAMETHASONE SODIUM PHOSPHATE 10 MG/ML IJ SOLN
8.0000 mg | Freq: Once | INTRAMUSCULAR | Status: DC
Start: 2023-11-14 — End: 2023-11-14

## 2023-11-14 MED ORDER — AMISULPRIDE (ANTIEMETIC) 5 MG/2ML IV SOLN: 10.0000 mg | Freq: Once | INTRAVENOUS | Status: DC | PRN

## 2023-11-14 MED ORDER — IRBESARTAN 150 MG PO TABS
300.0000 mg | ORAL_TABLET | Freq: Every day | ORAL | Status: DC
  Administered 2023-11-15: 300 mg via ORAL
  Filled 2023-11-14: qty 2

## 2023-11-14 MED ORDER — TRANEXAMIC ACID-NACL 1000-0.7 MG/100ML-% IV SOLN
1000.0000 mg | Freq: Once | INTRAVENOUS | Status: AC
Start: 2023-11-14 — End: 2023-11-15
  Administered 2023-11-14: 1000 mg via INTRAVENOUS
  Filled 2023-11-14: qty 100

## 2023-11-14 MED ORDER — MIDAZOLAM HCL 2 MG/2ML IJ SOLN
2.0000 mg | INTRAMUSCULAR | Status: DC
  Administered 2023-11-14: 1 mg via INTRAVENOUS
  Administered 2023-11-14: 2 mg via INTRAVENOUS
  Filled 2023-11-14: qty 2

## 2023-11-14 MED ORDER — PHENYLEPHRINE HCL-NACL 20-0.9 MG/250ML-% IV SOLN
INTRAVENOUS | Status: DC | PRN
  Administered 2023-11-14: 40 ug/min via INTRAVENOUS

## 2023-11-14 MED ORDER — LACTATED RINGERS IV SOLN: INTRAVENOUS | Status: DC

## 2023-11-14 MED ORDER — ACETAMINOPHEN 10 MG/ML IV SOLN
INTRAVENOUS | Status: AC
  Filled 2023-11-14: qty 100

## 2023-11-14 MED ORDER — ALUM & MAG HYDROXIDE-SIMETH 200-200-20 MG/5ML PO SUSP: 30.0000 mL | ORAL | Status: DC | PRN

## 2023-11-14 MED ORDER — CEFAZOLIN SODIUM-DEXTROSE 2-4 GM/100ML-% IV SOLN
2.0000 g | INTRAVENOUS | Status: DC
  Filled 2023-11-14: qty 100

## 2023-11-14 MED ORDER — FENTANYL CITRATE (PF) 100 MCG/2ML IJ SOLN
INTRAMUSCULAR | Status: DC | PRN
  Administered 2023-11-14: 25 ug via INTRAVENOUS

## 2023-11-14 MED ORDER — ONDANSETRON HCL 4 MG PO TABS: 4.0000 mg | ORAL_TABLET | Freq: Four times a day (QID) | ORAL | Status: DC | PRN

## 2023-11-14 MED ORDER — ONDANSETRON HCL 4 MG/2ML IJ SOLN: 4.0000 mg | Freq: Four times a day (QID) | INTRAMUSCULAR | Status: DC | PRN

## 2023-11-14 MED ORDER — DIPHENHYDRAMINE HCL 12.5 MG/5ML PO ELIX: 12.5000 mg | ORAL_SOLUTION | ORAL | Status: DC | PRN

## 2023-11-14 MED ORDER — BUPIVACAINE-EPINEPHRINE (PF) 0.5% -1:200000 IJ SOLN
INTRAMUSCULAR | Status: DC | PRN
  Administered 2023-11-14: 30 mL via PERINEURAL

## 2023-11-14 MED ORDER — BENZONATATE 100 MG PO CAPS
200.0000 mg | ORAL_CAPSULE | Freq: Three times a day (TID) | ORAL | Status: DC | PRN
  Administered 2023-11-14 – 2023-11-15 (×2): 200 mg via ORAL
  Filled 2023-11-14 (×2): qty 2

## 2023-11-14 MED ORDER — OXYCODONE HCL 5 MG PO TABS
10.0000 mg | ORAL_TABLET | ORAL | Status: DC | PRN
  Administered 2023-11-15: 15 mg via ORAL
  Filled 2023-11-14: qty 3

## 2023-11-14 MED ORDER — PHENOL 1.4 % MT LIQD: 1.0000 | OROMUCOSAL | Status: DC | PRN

## 2023-11-14 MED ORDER — OXYCODONE HCL 5 MG PO TABS: 5.0000 mg | ORAL_TABLET | ORAL | Status: DC | PRN

## 2023-11-14 MED ORDER — ORAL CARE MOUTH RINSE
15.0000 mL | Freq: Once | OROMUCOSAL | Status: AC
  Administered 2023-11-14: 15 mL via OROMUCOSAL

## 2023-11-14 MED ORDER — LIDOCAINE HCL (CARDIAC) PF 100 MG/5ML IV SOSY
PREFILLED_SYRINGE | INTRAVENOUS | Status: DC | PRN
  Administered 2023-11-14: 60 mg via INTRATRACHEAL

## 2023-11-14 MED ORDER — SODIUM CHLORIDE 0.9 % IR SOLN
Status: DC | PRN
  Administered 2023-11-14: 1000 mL

## 2023-11-14 MED ORDER — SODIUM CHLORIDE 0.9% FLUSH
3.0000 mL | Freq: Two times a day (BID) | INTRAVENOUS | Status: DC
  Administered 2023-11-15: 3 mL via INTRAVENOUS

## 2023-11-14 MED ORDER — ONDANSETRON HCL 4 MG/2ML IJ SOLN: 4.0000 mg | Freq: Once | INTRAMUSCULAR | Status: DC | PRN

## 2023-11-14 SURGICAL SUPPLY — 41 items
BAG COUNTER SPONGE SURGICOUNT (BAG) IMPLANT
BAG ZIPLOCK 12X15 (MISCELLANEOUS) ×1 IMPLANT
BLADE SAW SGTL 11.0X1.19X90.0M (BLADE) IMPLANT
BNDG ELASTIC 6INX 5YD STR LF (GAUZE/BANDAGES/DRESSINGS) ×1 IMPLANT
COVER SURGICAL LIGHT HANDLE (MISCELLANEOUS) ×1 IMPLANT
CUFF TRNQT CYL 34X4.125X (TOURNIQUET CUFF) ×1 IMPLANT
DERMABOND ADVANCED .7 DNX12 (GAUZE/BANDAGES/DRESSINGS) ×1 IMPLANT
DRAPE INCISE IOBAN 66X45 STRL (DRAPES) ×3 IMPLANT
DRAPE SHEET LG 3/4 BI-LAMINATE (DRAPES) ×1 IMPLANT
DRESSING PEEL AND PLC PRVNA 13 (GAUZE/BANDAGES/DRESSINGS) IMPLANT
DRSG ADAPTIC 3X8 NADH LF (GAUZE/BANDAGES/DRESSINGS) IMPLANT
DRSG AQUACEL AG ADV 3.5X10 (GAUZE/BANDAGES/DRESSINGS) ×1 IMPLANT
DRSG PEEL AND PLACE PREVENA 13 (GAUZE/BANDAGES/DRESSINGS) ×1 IMPLANT
DRSG TEGADERM 4X4.75 (GAUZE/BANDAGES/DRESSINGS) IMPLANT
DURAPREP 26ML APPLICATOR (WOUND CARE) ×2 IMPLANT
EVACUATOR 1/8 PVC DRAIN (DRAIN) IMPLANT
GAUZE PAD ABD 8X10 STRL (GAUZE/BANDAGES/DRESSINGS) IMPLANT
GAUZE SPONGE 2X2 8PLY STRL LF (GAUZE/BANDAGES/DRESSINGS) IMPLANT
GAUZE SPONGE 4X4 12PLY STRL (GAUZE/BANDAGES/DRESSINGS) IMPLANT
GLOVE BIO SURGEON STRL SZ 6 (GLOVE) IMPLANT
GLOVE BIOGEL PI IND STRL 6.5 (GLOVE) IMPLANT
GLOVE BIOGEL PI IND STRL 7.5 (GLOVE) ×3 IMPLANT
GLOVE ORTHO TXT STRL SZ7.5 (GLOVE) ×2 IMPLANT
GOWN STRL REUS W/ TWL LRG LVL3 (GOWN DISPOSABLE) ×2 IMPLANT
KIT DRSG PREVENA PLUS 7DAY 125 (MISCELLANEOUS) IMPLANT
KIT TURNOVER KIT A (KITS) IMPLANT
MANIFOLD NEPTUNE II (INSTRUMENTS) ×1 IMPLANT
PACK TOTAL KNEE CUSTOM (KITS) ×1 IMPLANT
PADDING CAST COTTON 6X4 STRL (CAST SUPPLIES) ×1 IMPLANT
PROTECTOR NERVE ULNAR (MISCELLANEOUS) ×1 IMPLANT
SET HNDPC FAN SPRY TIP SCT (DISPOSABLE) ×1 IMPLANT
SET PAD KNEE POSITIONER (MISCELLANEOUS) ×1 IMPLANT
STAPLER SKIN PROX WIDE 3.9 (STAPLE) IMPLANT
SUT ETHILON 2 0 PS N (SUTURE) IMPLANT
SUT MNCRL AB 4-0 PS2 18 (SUTURE) ×1 IMPLANT
SUT STRATAFIX PDS+ 0 24IN (SUTURE) ×1 IMPLANT
SUT VIC AB 1 CT1 36 (SUTURE) ×2 IMPLANT
SUT VIC AB 2-0 CT1 TAPERPNT 27 (SUTURE) ×3 IMPLANT
SWAB COLLECTION DEVICE MRSA (MISCELLANEOUS) ×1 IMPLANT
SWAB CULTURE ESWAB REG 1ML (MISCELLANEOUS) ×1 IMPLANT
TUBE SUCTION HIGH CAP CLEAR NV (SUCTIONS) IMPLANT

## 2023-11-14 NOTE — Discharge Instructions (Signed)
INSTRUCTIONS AFTER SURGERY  Remove items at home which could result in a fall. This includes throw rugs or furniture in walking pathways ICE to the affected joint every three hours while awake for 30 minutes at a time, for at least the first 3-5 days, and then as needed for pain and swelling.  Continue to use ice for pain and swelling. You may notice swelling that will progress down to the foot and ankle.  This is normal after surgery.  Elevate your leg when you are not up walking on it.   Continue to use the breathing machine you got in the hospital (incentive spirometer) which will help keep your temperature down.  It is common for your temperature to cycle up and down following surgery, especially at night when you are not up moving around and exerting yourself.  The breathing machine keeps your lungs expanded and your temperature down.   DIET:  As you were doing prior to hospitalization, we recommend a well-balanced diet.  DRESSING / WOUND CARE / SHOWERING  You may shower 3 days after surgery, but keep the wounds dry during showering.  You may use an occlusive plastic wrap (Press'n Seal for example), NO SOAKING/SUBMERGING IN THE BATHTUB.  If the bandage gets wet, change with a clean dry gauze.  If the incision gets wet, pat the wound dry with a clean towel.  ACTIVITY  Increase activity slowly as tolerated, but follow the weight bearing instructions below.   No driving for 6 weeks or until further direction given by your physician.  You cannot drive while taking narcotics.  No lifting or carrying greater than 10 lbs. until further directed by your surgeon. Avoid periods of inactivity such as sitting longer than an hour when not asleep. This helps prevent blood clots.  You may return to work once you are authorized by your doctor.     WEIGHT BEARING   Weight bearing as tolerated with assist device (walker, cane, etc) as directed, use it as long as suggested by your surgeon or therapist,  typically at least 4-6 weeks.  CONSTIPATION  Constipation is defined medically as fewer than three stools per week and severe constipation as less than one stool per week.  Even if you have a regular bowel pattern at home, your normal regimen is likely to be disrupted due to multiple reasons following surgery.  Combination of anesthesia, postoperative narcotics, change in appetite and fluid intake all can affect your bowels.   YOU MUST use at least one of the following options; they are listed in order of increasing strength to get the job done.  They are all available over the counter, and you may need to use some, POSSIBLY even all of these options:    Drink plenty of fluids (prune juice may be helpful) and high fiber foods Colace 100 mg by mouth twice a day  Senokot for constipation as directed and as needed Dulcolax (bisacodyl), take with full glass of water  Miralax (polyethylene glycol) once or twice a day as needed.  If you have tried all these things and are unable to have a bowel movement in the first 3-4 days after surgery call either your surgeon or your primary doctor.    If you experience loose stools or diarrhea, hold the medications until you stool forms back up.  If your symptoms do not get better within 1 week or if they get worse, check with your doctor.  If you experience "the worst abdominal pain ever"  or develop nausea or vomiting, please contact the office immediately for further recommendations for treatment.   ITCHING:  If you experience itching with your medications, try taking only a single pain pill, or even half a pain pill at a time.  You can also use Benadryl over the counter for itching or also to help with sleep.   TED HOSE STOCKINGS:  Use stockings on both legs until for at least 2 weeks or as directed by physician office. They may be removed at night for sleeping.  MEDICATIONS:  See your medication summary on the "After Visit Summary" that nursing will review  with you.  You may have some home medications which will be placed on hold until you complete the course of blood thinner medication.  It is important for you to complete the blood thinner medication as prescribed.  PRECAUTIONS:  If you experience chest pain or shortness of breath - call 911 immediately for transfer to the hospital emergency department.   If you develop a fever greater that 101 F, purulent drainage from wound, increased redness or drainage from wound, foul odor from the wound/dressing, or calf pain - CONTACT YOUR SURGEON.                                                   FOLLOW-UP APPOINTMENTS:  If you do not already have a post-op appointment, please call the office for an appointment to be seen by your surgeon.  Guidelines for how soon to be seen are listed in your "After Visit Summary", but are typically between 1-4 weeks after surgery.  POST-OPERATIVE OPIOID TAPER INSTRUCTIONS: It is important to wean off of your opioid medication as soon as possible. If you do not need pain medication after your surgery it is ok to stop day one. Opioids include: Codeine, Hydrocodone(Norco, Vicodin), Oxycodone(Percocet, oxycontin) and hydromorphone amongst others.  Long term and even short term use of opiods can cause: Increased pain response Dependence Constipation Depression Respiratory depression And more.  Withdrawal symptoms can include Flu like symptoms Nausea, vomiting And more Techniques to manage these symptoms Hydrate well Eat regular healthy meals Stay active Use relaxation techniques(deep breathing, meditating, yoga) Do Not substitute Alcohol to help with tapering If you have been on opioids for less than two weeks and do not have pain than it is ok to stop all together.  Plan to wean off of opioids This plan should start within one week post op of your joint replacement. Maintain the same interval or time between taking each dose and first decrease the dose.  Cut the  total daily intake of opioids by one tablet each day Next start to increase the time between doses. The last dose that should be eliminated is the evening dose.   MAKE SURE YOU:  Understand these instructions.  Get help right away if you are not doing well or get worse.    Thank you for letting us be a part of your medical care team.  It is a privilege we respect greatly.  We hope these instructions will help you stay on track for a fast and full recovery!

## 2023-11-14 NOTE — Transfer of Care (Signed)
Immediate Anesthesia Transfer of Care Note  Patient: [name] [name]  Procedure(s) Performed: RIGHT KNEE IRRIGATION AND DEBRIDEMENT AND WOUND CLOSURE (Right: Knee)  Patient Location: PACU  Anesthesia Type:General  Level of Consciousness: awake, alert , and oriented  Airway & Oxygen Therapy: Patient Spontanous Breathing  Post-op Assessment: Report given to RN  Post vital signs: stable  Last Vitals:  Vitals Value Taken Time  BP 126/69 11/14/23 1625  Temp    Pulse 79 11/14/23 1626  Resp 15 11/14/23 1626  SpO2 98 % 11/14/23 1626  Vitals shown include unfiled device data.  Last Pain:  Vitals:   11/14/23 1444  TempSrc: Oral         Complications: No notable events documented.

## 2023-11-14 NOTE — Anesthesia Postprocedure Evaluation (Signed)
Anesthesia Post Note  Patient: [name] [name]  Procedure(s) Performed: RIGHT KNEE IRRIGATION AND DEBRIDEMENT AND WOUND CLOSURE (Right: Knee)     Patient location during evaluation: PACU Anesthesia Type: Regional and General Level of consciousness: awake Pain management: pain level controlled Vital Signs Assessment: post-procedure vital signs reviewed and stable Respiratory status: spontaneous breathing, nonlabored ventilation and respiratory function stable Cardiovascular status: blood pressure returned to baseline and stable Postop Assessment: no apparent nausea or vomiting Anesthetic complications: no   No notable events documented.  Last Vitals:  Vitals:   11/14/23 1810 11/14/23 2017  BP: 134/82 (!) 140/78  Pulse: 76 62  Resp: 17 18  Temp: 36.6 C 36.8 C  SpO2: 97% 96%    Last Pain:  Vitals:   11/14/23 2000  TempSrc:   PainSc: 0-No pain                 Carlester Kasparek P Teasha Murrillo

## 2023-11-14 NOTE — Anesthesia Preprocedure Evaluation (Addendum)
Anesthesia Evaluation  Patient identified by MRN, date of birth, ID band Patient awake    Reviewed: Allergy & Precautions, NPO status , Patient's Chart, lab work & pertinent test results  Airway Mallampati: II  TM Distance: >3 FB Neck ROM: Full    Dental no notable dental hx.    Pulmonary neg pulmonary ROS   Pulmonary exam normal        Cardiovascular hypertension, Pt. on medications Normal cardiovascular exam     Neuro/Psych  PSYCHIATRIC DISORDERS  Depression    negative neurological ROS     GI/Hepatic Neg liver ROS,,,  Endo/Other  negative endocrine ROS    Renal/GU negative Renal ROS     Musculoskeletal  (+) Arthritis ,    Abdominal  (+) + obese  Peds  Hematology negative hematology ROS (+)   Anesthesia Other Findings Right knee seroma  Reproductive/Obstetrics                             Anesthesia Physical Anesthesia Plan  ASA: 2  Anesthesia Plan: General and Regional   Post-op Pain Management: Regional block*   Induction: Intravenous  PONV Risk Score and Plan: 2 and Ondansetron, Dexamethasone, Midazolam and Treatment may vary due to age or medical condition  Airway Management Planned: LMA  Additional Equipment:   Intra-op Plan:   Post-operative Plan: Extubation in OR  Informed Consent: I have reviewed the patients History and Physical, chart, labs and discussed the procedure including the risks, benefits and alternatives for the proposed anesthesia with the patient or authorized representative who has indicated his/her understanding and acceptance.     Dental advisory given  Plan Discussed with: CRNA  Anesthesia Plan Comments:        Anesthesia Quick Evaluation

## 2023-11-14 NOTE — Anesthesia Procedure Notes (Signed)
Procedure Name: LMA Insertion Date/Time: 11/14/2023 3:24 PM  Performed by: Micki Riley, CRNAPre-anesthesia Checklist: Patient identified, Emergency Drugs available, Suction available, Patient being monitored and Timeout performed Patient Re-evaluated:Patient Re-evaluated prior to induction Oxygen Delivery Method: Circle system utilized Preoxygenation: Pre-oxygenation with 100% oxygen Induction Type: IV induction Ventilation: Mask ventilation without difficulty LMA: LMA inserted LMA Size: 5.0 Number of attempts: 2

## 2023-11-14 NOTE — H&P (Signed)
 TOTAL KNEE I&D ADMISSION H&P   Patient is being admitted for right knee I&D with wound vac application   Therapy Plans: outpatient therapy at [facility] Disposition: Home with wife Planned DVT Prophylaxis: aspirin 81mg  BID DME needed: none PCP: Dr. Okey Dupre, clearance received  TXA: IV Allergies: NKDA Anesthesia Concerns: none BMI: 34.1 Last HgbA1c: Not diabetic      Other: - oxycodone, tizanidine, tylenol, celebrex  - No hx of VTE or cancer - Very active      Subjective:   Chief Complaint:right knee pain.   HPI: [name] [name], 73 y.o. male, has a complex history with his right knee. He had primary TKA with PJI in early 2024 which was then treated with 2 stage revision in May/August 2024. He has recovered well from this in general with no signs of recurrent infection. He has full extension and flexion past 90 degrees. He unfortunately has had recurrent prepatellar seromas, and was treated with a wound vac. He presents today for closure.        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   Infection of right knee (HCC) 11/14/2022   Chronic sinusitis 09/02/2022   Encounter for general adult medical examination with abnormal findings 07/09/2021   BPH (benign prostatic hyperplasia) 07/09/2021   MDD (major depressive disorder), recurrent, in full remission (HCC) 07/09/2021   Essential hypertension 10/20/2008        Past Medical History:  Diagnosis Date   Arthritis     Bronchitis     Depression     GERD (gastroesophageal reflux disease)     Hypertension     Skin cancer               Past Surgical History:  Procedure Laterality Date   CHOLECYSTECTOMY N/A 02/10/2021    Procedure: LAPAROSCOPIC CHOLECYSTECTOMY;  Surgeon: Harriette Bouillon, MD;  Location: [facility];  Service: General;  Laterality: N/A;   EXCISIONAL TOTAL KNEE  ARTHROPLASTY WITH ANTIBIOTIC SPACERS Right 02/21/2023    Procedure: EXCISIONAL TOTAL KNEE ARTHROPLASTY WITH ANTIBIOTIC SPACERS;  Surgeon: Durene Romans, MD;  Location: [facility];  Service: Orthopedics;  Laterality: Right;   HERNIA REPAIR       I & D KNEE WITH POLY EXCHANGE Right 11/15/2022    Procedure: IRRIGATION AND DEBRIDEMENT KNEE WITH  POLY EXCHANGE;  Surgeon: Eugenia Mcalpine, MD;  Location: [facility];  Service: Orthopedics;  Laterality: Right;  adductor canal, no antibiotics prior to culture add on room to follow other case 61   Knee Surgery Right 10/25/2022   REIMPLANTATION OF TOTAL KNEE Right 06/01/2023    Procedure: REIMPLANTATION/REVISION OF TOTAL KNEE;  Surgeon: Durene Romans, MD;  Location: [facility];  Service: Orthopedics;  Laterality: Right;   ROTATOR CUFF REPAIR       SKIN CANCER EXCISION            No current facility-administered medications for this encounter.             Current Outpatient Medications  Medication Sig Dispense Refill Last Dose/Taking   calcium carbonate (TUMS - DOSED IN MG ELEMENTAL CALCIUM) 500 MG chewable tablet Chew 2 tablets by mouth 2 (two) times daily as needed for indigestion or heartburn.     Taking As Needed   gabapentin (NEURONTIN) 300 MG capsule Take 300 mg by mouth 2 (two) times daily.  Taking   Ketotifen Fumarate (ALLERGY EYE DROPS OP) Place 1 drop into both eyes daily as needed (red/itchy eyes).     Taking As Needed   naproxen (NAPROSYN) 500 MG tablet Take 500 mg by mouth daily.     Taking   polyethylene glycol (MIRALAX / GLYCOLAX) 17 g packet Take 17 g by mouth 2 (two) times daily. (Patient taking differently: Take 17 g by mouth daily as needed for mild constipation or moderate constipation.) 14 each 0 Taking Differently   saccharomyces boulardii (FLORASTOR) 250 MG capsule Take 250 mg by mouth daily.     Taking   sertraline (ZOLOFT) 100 MG tablet Take 1 tablet (100 mg total) by mouth daily. 90 tablet 3 Taking   sodium chloride (OCEAN) 0.65 % SOLN  nasal spray Place 1 spray into both nostrils daily as needed for congestion.     Taking As Needed   tamsulosin (FLOMAX) 0.4 MG CAPS capsule Take 0.4 mg by mouth at bedtime.   0 Taking   telmisartan (MICARDIS) 80 MG tablet Take 1 tablet (80 mg total) by mouth daily. 90 tablet 3 Taking      Allergies       Allergies  Allergen Reactions   Avapro [Irbesartan] Cough   Chlorhexidine Rash      Reaction to PICC line dressing with Chlorhexidine gel Not allergic to Chlorhexidine soap   Lexapro [Escitalopram Oxalate] Anxiety      insomnia      Social History         Tobacco Use   Smoking status: Never   Smokeless tobacco: Never  Substance Use Topics   Alcohol use: Yes      Comment: socially         Family History  Problem Relation Age of Onset   Emphysema Mother          heart failure indusce emphysema   Heart disease Father     Heart attack Father     Heart disease Sister 24        [id number]          Review of Systems  Constitutional:  Negative for chills and fever.  Respiratory:  Negative for cough and shortness of breath.   Cardiovascular:  Negative for chest pain.  Gastrointestinal:  Negative for nausea and vomiting.  Musculoskeletal:  Positive for arthralgias.        Objective:   Physical Exam Right knee exam: Wound vac in place Wound bed clean and well appearing     Labs:         Imaging Review No interval imaging           Assessment/Plan:   S/p wound vac placement, right total knee    The patient history, physical examination, clinical judgment of the provider and imaging studies are consistent with prepatellar seroma of the right knee(s) and I&D is deemed medically necessary. The treatment options including medical management, injection therapy arthroscopy and arthroplasty were discussed at length. The risks and benefits of total knee arthroplasty were presented and reviewed. The risks due to aseptic loosening, infection, stiffness, thromboembolic  complications and other imponderables were discussed. The patient acknowledged the explanation, agreed to proceed with the plan and consent was signed. Patient is being admitted for inpatient treatment for surgery, pain control, PT, OT, prophylactic antibiotics, VTE prophylaxis, progressive ambulation and ADL's and discharge planning. The patient is planning to be discharged  home.     Rosalene Billings, PA-C Orthopedic Surgery [facility] [place]  [facility] [phone]

## 2023-11-14 NOTE — Interval H&P Note (Signed)
History and Physical Interval Note:  11/14/2023 2:11 PM  [name] [name]  has presented today for surgery, with the diagnosis of Right knee seroma.  The various methods of treatment have been discussed with the patient and family. After consideration of risks, benefits and other options for treatment, the patient has consented to  Procedure(s): RIGHT KNEE IRRIGATION AND DEBRIDEMENT AND WOUND CLOSURE (Right) as a surgical intervention.  The patient's history has been reviewed, patient examined, no change in status, stable for surgery.  I have reviewed the patient's chart and labs.  Questions were answered to the patient's satisfaction.     [name]

## 2023-11-14 NOTE — Brief Op Note (Signed)
11/14/2023  4:35 PM  PATIENT:  [name] [name]  73 y.o. male  PRE-OPERATIVE DIAGNOSIS:  status post incision and drainage with placement of wound vac, right knee  POST-OPERATIVE DIAGNOSIS:  status post incision and drainage with placement of wound vac, right knee  PROCEDURE:  Procedure(s): RIGHT KNEE IRRIGATION AND DEBRIDEMENT with primary WOUND CLOSURE (Right)  SURGEON:  Surgeons and Role:    Durene Romans, MD - Primary  PHYSICIAN ASSISTANT: Rosalene Billings, PA-C  ANESTHESIA:   regional and general  EBL:  <50 cc  BLOOD ADMINISTERED:none  DRAINS: none   LOCAL MEDICATIONS USED:  1 gm of Vancomycin powder  SPECIMEN:  No Specimen  DISPOSITION OF SPECIMEN:  N/A  COUNTS:  YES  TOURNIQUET:   Total Tourniquet Time Documented: Thigh (Right) - 7 minutes Total: Thigh (Right) - 7 minutes   DICTATION: .Other Dictation: Dictation Number [id number]  PLAN OF CARE: Admit for overnight observation  PATIENT DISPOSITION:  PACU - hemodynamically stable.   Delay start of Pharmacological VTE agent (>24hrs) due to surgical blood loss or risk of bleeding: no

## 2023-11-14 NOTE — Anesthesia Procedure Notes (Signed)
Anesthesia Regional Block: Adductor canal block   Pre-Anesthetic Checklist: , timeout performed,  Correct Patient, Correct Site, Correct Laterality,  Correct Procedure,, site marked,  Risks and benefits discussed,  Surgical consent,  Pre-op evaluation,  At surgeon's request and post-op pain management  Laterality: Right  Prep: chloraprep       Needles:  Injection technique: Single-shot  Needle Type: Echogenic Stimulator Needle     Needle Length: 9cm  Needle Gauge: 21     Additional Needles:   Procedures:,,,, ultrasound used (permanent image in chart),,    Narrative:  Start time: 11/14/2023 2:40 PM End time: 11/14/2023 2:50 PM Injection made incrementally with aspirations every 5 mL.  Performed by: Personally  Anesthesiologist: Leonides Grills, MD  Additional Notes: Functioning IV was confirmed and monitors were applied. A time-out was performed. Hand hygiene and sterile gloves were used. The thigh was placed in a frog-leg position and prepped in a sterile fashion. A 90mm 21ga Arrow echogenic stimulator needle was placed using ultrasound guidance.  Negative aspiration and negative test dose prior to incremental administration of local anesthetic. The patient tolerated the procedure well.

## 2023-11-14 NOTE — Op Note (Signed)
NAME: [name] [name], [name] A. MEDICAL RECORD NO: [id number] ACCOUNT NO: [id number] DATE OF BIRTH: 1951/05/13 FACILITY: [facility]ATION: [id number] PHYSICIAN: Madlyn Frankel. 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  PROCEDURE: 1. Excisional and non-excisional debridement of right knee wound with now primary wound closure. 2. Placement of a Prevena wound VAC.  Excisional debridement was carried out over a 3 to 4-inch incision including skin and subcutaneous scar tissue and nonviable tissue. This was carried out using a scalpel and the Bovie cautery.  Non-excisional debridement was carried out with 1 liter of normal saline solution as well as approximately 200 to 300 mL of Prontosan antimicrobial fluid.  SURGEON:  Madlyn Frankel. Charlann Boxer, MD  ASSISTANT:  Rosalene Billings, PA-C.  Note that Ms. Domenic Schwab was present for the entirety of the case from preoperative positioning, perioperative management of the operative extremity, general facilitation of the case and primary wound closure.  ANESTHESIA:  Regional plus general.  BLOOD LOSS:  Less than 50 mL.  TOURNIQUET:  Tourniquet was up for 7 minutes at 225 mmHg.  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  He was seen, evaluated, and treated by myself for two-stage treatment.  Following his  reimplantation procedure, he had significant postoperative seroma without obvious knee effusions.  We had multiple attempts  in the office to drain this; however, this wound began to act like a Norma Fredrickson type lesion atypically with this persistent  seroma.  We thus had a lengthy discussion approximately 4-5 weeks ago and went back to the operating room and washed out the superficial layer and placed a wound VAC.  The wound VAC sponge was applied in an area of about 2 to 3 inches and allowed for  significant decompression of the seroma fluid.  He was followed in the office and through weekly clean dressing changes in the office, we removed the VAC and reapplied.  Based on the healing response and the decreased amount of seroma, we felt it was  time at this point to take him to the operating room to reevaluate the wound, clean it out, and reapproximate the skin edges.  Obvious risks at this point are infection, minimal risk for DVT, risk of recurrent seroma, and need for future procedures.   Consent was obtained for the benefit of management of his wound.  DESCRIPTION OF PROCEDURE:  The patient was brought to the operative theater.  Once adequate anesthesia, preoperative antibiotics, Ancef administered as well as tranexamic acid, he was positioned supine with the right thigh tourniquet placed.  The right  lower extremity was then prepped and draped in a sterile fashion.  Timeout was performed identifying the patient, planned procedure and extremity.  Note that his right leg wound was cleaned and prepped with Betadine scrub and paint.  His old incision had  been marked on the skin.  I used a scalpel at this point to create about a 4-inch incision and excised the skin edges from the wound as well as nonviable tissue.  I also used the Bovie  for this purpose.  Once I performed this sharp debridement as noted,  we then irrigated the wound with 1 liter of normal saline solution as well as using Prontosan antimicrobial fluid.  Once this was done, I had let the tourniquet down.  We did try to achieve hemostasis in all areas.  I  evacuated some fluid from the knee  that had a fibrous material in it, but it had an appearance of synovial-type fluid in its color.  Once I achieved as much hemostasis as possible, we elected to close his knee with nylon suture and apply a Prevena VAC sponge dressing to allow for further  decompression and to allow for better wound healing capabilities.  I placed 1 g of vancomycin powder into his wound.  We then reapproximated the skin edges using 2-0 nylon suture in an interrupted horizontal mattress fashion.  Once this was done, we  placed the Prevena dressing on his wound and applied it to suction.  He was then brought to the recovery room, extubated in stable condition, tolerating the procedure well.  Findings were reviewed with his wife.  He will be admitted overnight for observation at least.  He will be placed on IV antibiotics overnight.  I will put him on oral antibiotics until this is healed.  We will also consider using TXA to minimize any postoperative seroma collection.   PUS D: 11/14/2023 4:44:40 pm T: 11/14/2023 6:22:00 pm  JOB: [id number]/ [id number]

## 2023-11-14 NOTE — Plan of Care (Signed)
  Problem: Education: Goal: Knowledge of General Education information will improve Description: Including pain rating scale, medication(s)/side effects and non-pharmacologic comfort measures Outcome: Progressing   Problem: Health Behavior/Discharge Planning: Goal: Ability to manage health-related needs will improve Outcome: Progressing   Problem: Clinical Measurements: Goal: Respiratory complications will improve Outcome: Progressing   Problem: Nutrition: Goal: Adequate nutrition will be maintained Outcome: Progressing   Problem: Safety: Goal: Ability to remain free from injury will improve Outcome: Progressing   Problem: Coping: Goal: Level of anxiety will decrease Outcome: Not Progressing   Problem: Pain Managment: Goal: General experience of comfort will improve and/or be controlled Outcome: Not Progressing

## 2023-11-15 ENCOUNTER — Encounter (HOSPITAL_COMMUNITY): Payer: Self-pay | Admitting: Orthopedic Surgery

## 2023-11-15 DIAGNOSIS — I10 Essential (primary) hypertension: Secondary | ICD-10-CM | POA: Diagnosis not present

## 2023-11-15 DIAGNOSIS — L7634 Postprocedural seroma of skin and subcutaneous tissue following other procedure: Secondary | ICD-10-CM | POA: Diagnosis not present

## 2023-11-15 DIAGNOSIS — Z85828 Personal history of other malignant neoplasm of skin: Secondary | ICD-10-CM | POA: Diagnosis not present

## 2023-11-15 DIAGNOSIS — Z96651 Presence of right artificial knee joint: Secondary | ICD-10-CM | POA: Diagnosis not present

## 2023-11-15 DIAGNOSIS — Z79899 Other long term (current) drug therapy: Secondary | ICD-10-CM | POA: Diagnosis not present

## 2023-11-15 LAB — BASIC METABOLIC PANEL
Anion gap: 9 (ref 5–15)
BUN: 15 mg/dL (ref 8–23)
CO2: 24 mmol/L (ref 22–32)
Calcium: 8.5 mg/dL — ABNORMAL LOW (ref 8.9–10.3)
Chloride: 100 mmol/L (ref 98–111)
Creatinine, Ser: 0.76 mg/dL (ref 0.61–1.24)
GFR, Estimated: >60 mL/min (ref >60)
Glucose, Bld: 197 mg/dL — ABNORMAL HIGH (ref 70–99)
Potassium: 3.8 mmol/L (ref 3.5–5.1)
Sodium: 133 mmol/L — ABNORMAL LOW (ref 135–145)

## 2023-11-15 LAB — CBC
HCT: 37.1 % — ABNORMAL LOW (ref 39.0–52.0)
Hemoglobin: 11.6 g/dL — ABNORMAL LOW (ref 13.0–17.0)
MCH: 27.4 pg (ref 26.0–34.0)
MCHC: 31.3 g/dL (ref 30.0–36.0)
MCV: 87.7 fL (ref 80.0–100.0)
Platelets: 222 10*3/uL (ref 150–400)
RBC: 4.23 MIL/uL (ref 4.22–5.81)
RDW: 14.7 % (ref 11.5–15.5)
WBC: 5.6 10*3/uL (ref 4.0–10.5)
nRBC: 0 % (ref 0.0–0.2)

## 2023-11-15 MED ORDER — CEFADROXIL 500 MG PO CAPS: 500.0000 mg | ORAL_CAPSULE | Freq: Two times a day (BID) | ORAL | 0 refills | Status: DC

## 2023-11-15 MED ORDER — METHOCARBAMOL 500 MG PO TABS: 500.0000 mg | ORAL_TABLET | Freq: Four times a day (QID) | ORAL | 1 refills | Status: DC | PRN

## 2023-11-15 MED ORDER — OXYCODONE HCL 5 MG PO TABS: 5.0000 mg | ORAL_TABLET | ORAL | 0 refills | Status: DC | PRN

## 2023-11-15 MED ORDER — FUROSEMIDE 20 MG PO TABS: 20.0000 mg | ORAL_TABLET | Freq: Every day | ORAL | 0 refills | Status: DC | PRN

## 2023-11-15 NOTE — TOC Transition Note (Signed)
Transition of Care [facility]) - Discharge Note   Patient Details  Name: [name] [name] MRN: [id number] Date of Birth: October 02, 1951  Transition of [facility]) CM/SW Contact:  Howell Rucks, RN Phone Number: 11/15/2023, 10:09 AM   Clinical Narrative:   Met with pt at bedside to review dc plans for follow up therapy and home DME needs, pt confirmed OPPT at EO, home home DME needs. No TOC needs.     Final next level of care: OP Rehab Barriers to Discharge: No Barriers Identified   Patient Goals and CMS Choice Patient states their goals for this hospitalization and ongoing recovery are:: return home          Discharge Placement                       Discharge Plan and Services Additional resources added to the After Visit Summary for                                       Social Drivers of Health (SDOH) Interventions SDOH Screenings   Food Insecurity: No Food Insecurity (11/14/2023)  Housing: Low Risk  (11/14/2023)  Transportation Needs: No Transportation Needs (11/14/2023)  Utilities: Not At Risk (11/14/2023)  Alcohol Screen: Low Risk  (06/26/2023)  Depression (PHQ2-9): Low Risk  (06/29/2023)  Financial Resource Strain: Low Risk  (06/26/2023)  Physical Activity: Inactive (06/26/2023)  Social Connections: Moderately Isolated (11/14/2023)  Stress: No Stress Concern Present (06/26/2023)  Tobacco Use: Low Risk  (11/14/2023)  Health Literacy: Adequate Health Literacy (06/29/2023)     Readmission Risk Interventions    02/22/2023    2:16 PM  Readmission Risk Prevention Plan  Post Dischage Appt Complete  Medication Screening Complete  Transportation Screening Complete

## 2023-11-15 NOTE — Evaluation (Signed)
Physical Therapy Evaluation Patient Details Name: [name] [name] MRN: [id number] DOB: 08/16/1951 Today's Date: 11/15/2023  History of Present Illness  73 y.o. male, has a complex history with his right knee. 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 He has recovered well from this in general with no signs of recurrent infection. He has full extension and flexion past 90 degrees. He unfortunately has had recurrent prepatellar seromas, and was treated with a wound vac. He presented 11/14/23 for closure. PMH: HTN, BPH, depression.  Clinical Impression  Pt is mobilizing well, he ambulated 350' with RW, no loss of balance. He demonstrates good understanding of HEP. Pt declined stair training, he stated he recalls correct technique from multiple prior admissions. He is ready to DC home from a PT standpoint.          If plan is discharge home, recommend the following: A lot of help with bathing/dressing/bathroom;Assistance with cooking/housework;Assist for transportation   Can travel by private vehicle        Equipment Recommendations None recommended by PT  Recommendations for Other Services       Functional Status Assessment Patient has had a recent decline in their functional status and demonstrates the ability to make significant improvements in function in a reasonable and predictable amount of time.     Precautions / Restrictions Precautions Precautions: Knee Precaution Booklet Issued: Yes (comment) Precaution/Restrictions Comments: VAC R knee Restrictions Weight Bearing Restrictions Per Provider Order: No RLE Weight Bearing Per Provider Order: Weight bearing as tolerated      Mobility  Bed Mobility Overal bed mobility: Modified Independent Bed Mobility: Supine to Sit     Supine to sit: Modified independent (Device/Increase time), HOB elevated, Used rails          Transfers Overall  transfer level: Modified independent Equipment used: Rolling walker (2 wheels) Transfers: Sit to/from Stand Sit to Stand: Modified independent (Device/Increase time)                Ambulation/Gait Ambulation/Gait assistance: Modified independent (Device/Increase time) Gait Distance (Feet): 350 Feet Assistive device: Rolling walker (2 wheels) Gait Pattern/deviations: Step-through pattern Gait velocity: decr     General Gait Details: step through pattern, good wt shift to RLE without incr pain; R knee flexion limited during swing phase, no loss of balance  Stairs            Wheelchair Mobility     Tilt Bed    Modified Rankin (Stroke Patients Only)       Balance Overall balance assessment: Modified Independent                                           Pertinent Vitals/Pain Pain Assessment Pain Assessment: No/denies pain Pain Score: 0-No pain Pain Intervention(s): Ice applied    Home Living Family/patient expects to be discharged to:: Private residence Living Arrangements: Spouse/significant other Available Help at Discharge: Family;Available 24 hours/day Type of Home: House Home Access: Stairs to enter Entrance Stairs-Rails: Right;Left;Can reach both Entrance Stairs-Number of Steps: 2   Home Layout: One level Home Equipment: Agricultural consultant (2 wheels);Cane - single point;Grab bars - tub/shower;Grab bars - toilet      Prior Function Prior Level of Function : Independent/Modified Independent  Mobility Comments: independent ADLs Comments: pt reports ind     Extremity/Trunk Assessment   Upper Extremity Assessment Upper Extremity Assessment: Defer to OT evaluation    Lower Extremity Assessment Lower Extremity Assessment: RLE deficits/detail RLE Deficits / Details: SLR at least 3/5, knee ext at least 3/5, Knee AROM 0-45* (pt hesitant to flex more than 45* bc this causes more drainage) RLE Sensation: WNL RLE  Coordination: WNL    Cervical / Trunk Assessment Cervical / Trunk Assessment: Normal  Communication   Communication Communication: No apparent difficulties    Cognition Arousal: Alert Behavior During Therapy: WFL for tasks assessed/performed                             Following commands: Intact       Cueing       General Comments      Exercises Total Joint Exercises Ankle Circles/Pumps: AROM, Both, 5 reps Quad Sets: AROM, Both, 5 reps, Supine Short Arc Quad: AROM, Right, 5 reps, Supine Heel Slides: AAROM, Right, 5 reps, Supine Hip ABduction/ADduction: AROM, Right, 5 reps, Supine Straight Leg Raises: AROM, Right, 5 reps, Supine   Assessment/Plan    PT Assessment All further PT needs can be met in the next venue of care  PT Problem List Decreased mobility;Decreased range of motion       PT Treatment Interventions      PT Goals (Current goals can be found in the Care Plan section)  Acute Rehab PT Goals Patient Stated Goal: go on walks, play golf, do yardwork PT Goal Formulation: All assessment and education complete, DC therapy    Frequency       Co-evaluation               AM-PAC PT "6 Clicks" Mobility  Outcome Measure Help needed turning from your back to your side while in a flat bed without using bedrails?: None Help needed moving from lying on your back to sitting on the side of a flat bed without using bedrails?: None Help needed moving to and from a bed to a chair (including a wheelchair)?: None Help needed standing up from a chair using your arms (e.g., wheelchair or bedside chair)?: None Help needed to walk in hospital room?: None Help needed climbing 3-5 steps with a railing? : None 6 Click Score: 24    End of Session Equipment Utilized During Treatment: Gait belt Activity Tolerance: Patient tolerated treatment well Patient left: in bed;with call bell/phone within reach;with nursing/sitter in room;with family/visitor  present Nurse Communication: Mobility status PT Visit Diagnosis: Other abnormalities of gait and mobility (R26.89)    Time: 4098-1191 PT Time Calculation (min) (ACUTE ONLY): 23 min   Charges:   PT Evaluation $PT Eval Moderate Complexity: 1 Mod PT Treatments $Gait Training: 8-22 mins PT General Charges $$ ACUTE PT VISIT: 1 Visit         Tamala Ser PT 11/15/2023  Acute Rehabilitation Services  Office [phone]

## 2023-11-15 NOTE — Progress Notes (Signed)
   Subjective: 1 Day Post-Op Procedure(s) (LRB): RIGHT KNEE IRRIGATION AND DEBRIDEMENT AND WOUND CLOSURE (Right) Patient reports pain as mild.   Patient seen in rounds with Dr. Charlann Boxer. Patient is well, and has had no acute complaints or problems. No acute events overnight. Foley catheter removed. He has not been up with PT yet.  We will start therapy today.   Objective: Vital signs in last 24 hours: Temp:  [97.4 F (36.3 C)-98.7 F (37.1 C)] 97.5 F (36.4 C) (02/12 0557) Pulse Rate:  [61-87] 65 (02/12 0557) Resp:  [5-25] 18 (02/12 0557) BP: (111-143)/(68-82) 142/69 (02/12 0557) SpO2:  [87 %-100 %] 97 % (02/12 0557) Weight:  [104 kg] 104 kg (02/11 1345)  Intake/Output from previous day:  Intake/Output Summary (Last 24 hours) at 11/15/2023 0755 Last data filed at 11/15/2023 0645 Gross per 24 hour  Intake 920 ml  Output 1660 ml  Net -740 ml     Intake/Output this shift: No intake/output data recorded.  Labs: Recent Labs    11/15/23 0343  HGB 11.6*   Recent Labs    11/15/23 0343  WBC 5.6  RBC 4.23  HCT 37.1*  PLT 222   Recent Labs    11/15/23 0343  NA 133*  K 3.8  CL 100  CO2 24  BUN 15  CREATININE 0.76  GLUCOSE 197*  CALCIUM 8.5*   No results for input(s): "LABPT", "INR" in the last 72 hours.  Exam: General - Patient is Alert and Oriented Extremity - Neurologically intact Sensation intact distally Intact pulses distally Dorsiflexion/Plantar flexion intact Dressing - dressing C/D/I, prevena in place Motor Function - intact, moving foot and toes well on exam.   Past Medical History:  Diagnosis Date   Arthritis    Bronchitis    Depression    GERD (gastroesophageal reflux disease)    Hypertension    Skin cancer     Assessment/Plan: 1 Day Post-Op Procedure(s) (LRB): RIGHT KNEE IRRIGATION AND DEBRIDEMENT AND WOUND CLOSURE (Right) Principal Problem:   Postoperative seroma of subcutaneous tissue after non-dermatologic procedure Active Problems:    Postoperative seroma of musculoskeletal structure after musculoskeletal procedure  Estimated body mass index is 33.86 kg/m as calculated from the following:   Height as of this encounter: 5\' 9"  (1.753 m).   Weight as of this encounter: 104 kg. Advance diet Up with therapy D/C IV fluids    DVT Prophylaxis - None Weight bearing as tolerated.  Prevena in place with only 10 cc output overnight Will send home with additional aquacel in case the vac comes off or stops functioning  Home today No OPPT currently Will send with PRN lasix rx due to his history of significant LE edema post-op  Rosalene Billings, PA-C Orthopedic Surgery [phone] 11/15/2023, 7:55 AM

## 2023-11-15 NOTE — Care Management Obs Status (Signed)
MEDICARE OBSERVATION STATUS NOTIFICATION   Patient Details  Name: [name] [name] MRN: [id number] Date of Birth: 10-Feb-1951   Medicare Observation Status Notification Given:       Howell Rucks, RN 11/15/2023, 10:09 AM

## 2023-11-15 NOTE — Plan of Care (Signed)
  Problem: Elimination: Goal: Will not experience complications related to urinary retention Outcome: Progressing   Problem: Safety: Goal: Ability to remain free from injury will improve Outcome: Progressing   Problem: Education: Goal: Knowledge of the prescribed therapeutic regimen will improve Outcome: Progressing   Problem: Activity: Goal: Range of joint motion will improve Outcome: Progressing   Problem: Pain Management: Goal: Pain level will decrease with appropriate interventions Outcome: Progressing

## 2023-12-01 NOTE — Progress Notes (Signed)
 Surgery orders requested via Epic inbox.

## 2023-12-01 NOTE — Progress Notes (Signed)
 Patient phoned to give updated information on surgery.  Date of Surgery - 12-05-23  Arrival Time - 1245 and check in at admitting.    NPO Status - patient reminded to not eat solid food after midnight.  From midnight until 1200 may have clear liquids  Medications morning of surgery - Cefadroxil, Gabapentin, Sertraline, Oxycodone if needed  No change in medical history, allergies per patient since surgery on 11-14-23.  Transportation home - patient is an overnight stay  All questions answered and patient stated understanding

## 2023-12-05 ENCOUNTER — Encounter (HOSPITAL_COMMUNITY): Payer: Self-pay | Admitting: Orthopedic Surgery

## 2023-12-05 ENCOUNTER — Observation Stay (HOSPITAL_COMMUNITY)
Admission: RE | Admit: 2023-12-05 | Discharge: 2023-12-07 | Disposition: A | Discharge status: Home / Self Care | Payer: Medicare Other | Attending: Orthopedic Surgery | Admitting: Orthopedic Surgery

## 2023-12-05 ENCOUNTER — Other Ambulatory Visit: Payer: Self-pay

## 2023-12-05 ENCOUNTER — Encounter (HOSPITAL_COMMUNITY)
Admission: RE | Disposition: A | Discharge status: Home / Self Care | Payer: Self-pay | Source: Home / Self Care | Attending: Orthopedic Surgery

## 2023-12-05 ENCOUNTER — Ambulatory Visit (HOSPITAL_COMMUNITY): Admitting: Anesthesiology

## 2023-12-05 DIAGNOSIS — Y828 Other medical devices associated with adverse incidents: Secondary | ICD-10-CM | POA: Diagnosis not present

## 2023-12-05 DIAGNOSIS — I10 Essential (primary) hypertension: Secondary | ICD-10-CM | POA: Insufficient documentation

## 2023-12-05 DIAGNOSIS — T8453XA Infection and inflammatory reaction due to internal right knee prosthesis, initial encounter: Secondary | ICD-10-CM | POA: Diagnosis not present

## 2023-12-05 DIAGNOSIS — S81001D Unspecified open wound, right knee, subsequent encounter: Secondary | ICD-10-CM

## 2023-12-05 DIAGNOSIS — Z79899 Other long term (current) drug therapy: Secondary | ICD-10-CM | POA: Insufficient documentation

## 2023-12-05 DIAGNOSIS — T8142XA Infection following a procedure, deep incisional surgical site, initial encounter: Secondary | ICD-10-CM | POA: Diagnosis not present

## 2023-12-05 DIAGNOSIS — L7634 Postprocedural seroma of skin and subcutaneous tissue following other procedure: Principal | ICD-10-CM

## 2023-12-05 DIAGNOSIS — Z96651 Presence of right artificial knee joint: Secondary | ICD-10-CM | POA: Diagnosis not present

## 2023-12-05 DIAGNOSIS — Z85828 Personal history of other malignant neoplasm of skin: Secondary | ICD-10-CM | POA: Insufficient documentation

## 2023-12-05 DIAGNOSIS — M96842 Postprocedural seroma of a musculoskeletal structure following a musculoskeletal system procedure: Secondary | ICD-10-CM | POA: Diagnosis not present

## 2023-12-05 DIAGNOSIS — G8918 Other acute postprocedural pain: Secondary | ICD-10-CM | POA: Diagnosis not present

## 2023-12-05 HISTORY — PX: IRRIGATION AND DEBRIDEMENT KNEE: SHX5185

## 2023-12-05 SURGERY — IRRIGATION AND DEBRIDEMENT KNEE
Anesthesia: General | Site: Knee | Laterality: Right

## 2023-12-05 MED ORDER — ROPIVACAINE HCL 5 MG/ML IJ SOLN
INTRAMUSCULAR | Status: DC | PRN
  Administered 2023-12-05: 20 mL via PERINEURAL

## 2023-12-05 MED ORDER — FENTANYL CITRATE (PF) 100 MCG/2ML IJ SOLN
INTRAMUSCULAR | Status: AC
  Filled 2023-12-05: qty 2

## 2023-12-05 MED ORDER — MIDAZOLAM HCL 2 MG/2ML IJ SOLN: 2.0000 mg | INTRAMUSCULAR | Status: DC

## 2023-12-05 MED ORDER — POVIDONE-IODINE 10 % EX SWAB: 2.0000 | Freq: Once | CUTANEOUS | Status: DC

## 2023-12-05 MED ORDER — ACETAMINOPHEN 500 MG PO TABS
1000.0000 mg | ORAL_TABLET | Freq: Four times a day (QID) | ORAL | Status: DC
  Administered 2023-12-05 – 2023-12-07 (×7): 1000 mg via ORAL
  Filled 2023-12-05 (×7): qty 2

## 2023-12-05 MED ORDER — FENTANYL CITRATE (PF) 100 MCG/2ML IJ SOLN
INTRAMUSCULAR | Status: DC | PRN
  Administered 2023-12-05: 100 ug via INTRAVENOUS

## 2023-12-05 MED ORDER — POLYETHYLENE GLYCOL 3350 17 G PO PACK
17.0000 g | PACK | Freq: Two times a day (BID) | ORAL | Status: DC
  Administered 2023-12-05 – 2023-12-07 (×4): 17 g via ORAL
  Filled 2023-12-05 (×4): qty 1

## 2023-12-05 MED ORDER — PRONTOSAN WOUND IRRIGATION OPTIME
TOPICAL | Status: DC | PRN
  Administered 2023-12-05: 1 via TOPICAL

## 2023-12-05 MED ORDER — SODIUM CHLORIDE 0.9% FLUSH: 3.0000 mL | INTRAVENOUS | Status: DC | PRN

## 2023-12-05 MED ORDER — SACCHAROMYCES BOULARDII 250 MG PO CAPS
250.0000 mg | ORAL_CAPSULE | Freq: Every day | ORAL | Status: DC
  Administered 2023-12-05 – 2023-12-07 (×3): 250 mg via ORAL
  Filled 2023-12-05 (×3): qty 1

## 2023-12-05 MED ORDER — VANCOMYCIN HCL 1000 MG IV SOLR
INTRAVENOUS | Status: AC
  Filled 2023-12-05: qty 20

## 2023-12-05 MED ORDER — GABAPENTIN 300 MG PO CAPS
300.0000 mg | ORAL_CAPSULE | Freq: Two times a day (BID) | ORAL | Status: DC
  Administered 2023-12-05 – 2023-12-07 (×4): 300 mg via ORAL
  Filled 2023-12-05 (×4): qty 1

## 2023-12-05 MED ORDER — BISACODYL 10 MG RE SUPP: 10.0000 mg | Freq: Every day | RECTAL | Status: DC | PRN

## 2023-12-05 MED ORDER — MENTHOL 3 MG MT LOZG: 1.0000 | LOZENGE | OROMUCOSAL | Status: DC | PRN

## 2023-12-05 MED ORDER — METHOCARBAMOL 500 MG PO TABS: 500.0000 mg | ORAL_TABLET | Freq: Four times a day (QID) | ORAL | Status: DC | PRN

## 2023-12-05 MED ORDER — TAMSULOSIN HCL 0.4 MG PO CAPS
0.4000 mg | ORAL_CAPSULE | Freq: Every day | ORAL | Status: DC
  Administered 2023-12-05 – 2023-12-06 (×2): 0.4 mg via ORAL
  Filled 2023-12-05 (×2): qty 1

## 2023-12-05 MED ORDER — DIPHENHYDRAMINE HCL 12.5 MG/5ML PO ELIX: 12.5000 mg | ORAL_SOLUTION | ORAL | Status: DC | PRN

## 2023-12-05 MED ORDER — PHENYLEPHRINE 80 MCG/ML (10ML) SYRINGE FOR IV PUSH (FOR BLOOD PRESSURE SUPPORT)
PREFILLED_SYRINGE | INTRAVENOUS | Status: DC | PRN
  Administered 2023-12-05 (×2): 80 ug via INTRAVENOUS

## 2023-12-05 MED ORDER — 0.9 % SODIUM CHLORIDE (POUR BTL) OPTIME
TOPICAL | Status: DC | PRN
  Administered 2023-12-05: 1000 mL

## 2023-12-05 MED ORDER — ALUM & MAG HYDROXIDE-SIMETH 200-200-20 MG/5ML PO SUSP: 30.0000 mL | ORAL | Status: DC | PRN

## 2023-12-05 MED ORDER — CEFAZOLIN SODIUM-DEXTROSE 2-4 GM/100ML-% IV SOLN
2.0000 g | INTRAVENOUS | Status: AC
  Administered 2023-12-05: 2 g via INTRAVENOUS
  Filled 2023-12-05: qty 100

## 2023-12-05 MED ORDER — PHENOL 1.4 % MT LIQD: 1.0000 | OROMUCOSAL | Status: DC | PRN

## 2023-12-05 MED ORDER — FENTANYL CITRATE PF 50 MCG/ML IJ SOSY
PREFILLED_SYRINGE | INTRAMUSCULAR | Status: AC
  Filled 2023-12-05: qty 2

## 2023-12-05 MED ORDER — HYDROMORPHONE HCL 1 MG/ML IJ SOLN: 0.5000 mg | INTRAMUSCULAR | Status: DC | PRN

## 2023-12-05 MED ORDER — FENTANYL CITRATE PF 50 MCG/ML IJ SOSY: 25.0000 ug | PREFILLED_SYRINGE | INTRAMUSCULAR | Status: DC | PRN

## 2023-12-05 MED ORDER — ONDANSETRON HCL 4 MG PO TABS: 4.0000 mg | ORAL_TABLET | Freq: Four times a day (QID) | ORAL | Status: DC | PRN

## 2023-12-05 MED ORDER — TRANEXAMIC ACID-NACL 1000-0.7 MG/100ML-% IV SOLN
1000.0000 mg | Freq: Once | INTRAVENOUS | Status: AC
  Administered 2023-12-05: 1000 mg via INTRAVENOUS
  Filled 2023-12-05: qty 100

## 2023-12-05 MED ORDER — IRBESARTAN 150 MG PO TABS
300.0000 mg | ORAL_TABLET | Freq: Every day | ORAL | Status: DC
  Administered 2023-12-06 – 2023-12-07 (×2): 300 mg via ORAL
  Filled 2023-12-05 (×2): qty 2

## 2023-12-05 MED ORDER — TRANEXAMIC ACID-NACL 1000-0.7 MG/100ML-% IV SOLN
1000.0000 mg | INTRAVENOUS | Status: AC
  Administered 2023-12-05: 1000 mg via INTRAVENOUS
  Filled 2023-12-05: qty 100

## 2023-12-05 MED ORDER — SURGIPHOR WOUND IRRIGATION SYSTEM - OPTIME: TOPICAL | Status: DC | PRN

## 2023-12-05 MED ORDER — CEFAZOLIN SODIUM-DEXTROSE 2-4 GM/100ML-% IV SOLN
2.0000 g | Freq: Four times a day (QID) | INTRAVENOUS | Status: AC
  Administered 2023-12-05 – 2023-12-06 (×2): 2 g via INTRAVENOUS
  Filled 2023-12-05 (×2): qty 100

## 2023-12-05 MED ORDER — SENNA 8.6 MG PO TABS
2.0000 | ORAL_TABLET | Freq: Every day | ORAL | Status: DC
  Administered 2023-12-05 – 2023-12-06 (×2): 17.2 mg via ORAL
  Filled 2023-12-05 (×2): qty 2

## 2023-12-05 MED ORDER — DEXAMETHASONE SODIUM PHOSPHATE 10 MG/ML IJ SOLN
8.0000 mg | Freq: Once | INTRAMUSCULAR | Status: AC
  Administered 2023-12-05: 10 mg via INTRAVENOUS

## 2023-12-05 MED ORDER — SODIUM CHLORIDE 0.9 % IV SOLN: 2.0000 g | INTRAVENOUS | Status: DC

## 2023-12-05 MED ORDER — ONDANSETRON HCL 4 MG/2ML IJ SOLN: 4.0000 mg | Freq: Four times a day (QID) | INTRAMUSCULAR | Status: DC | PRN

## 2023-12-05 MED ORDER — CLONIDINE HCL (ANALGESIA) 100 MCG/ML EP SOLN
EPIDURAL | Status: DC | PRN
  Administered 2023-12-05: 50 ug

## 2023-12-05 MED ORDER — FENTANYL CITRATE PF 50 MCG/ML IJ SOSY
100.0000 ug | PREFILLED_SYRINGE | INTRAMUSCULAR | Status: DC
  Administered 2023-12-05: 50 ug via INTRAVENOUS

## 2023-12-05 MED ORDER — ASPIRIN 81 MG PO CHEW
81.0000 mg | CHEWABLE_TABLET | Freq: Two times a day (BID) | ORAL | Status: DC
  Administered 2023-12-05 – 2023-12-07 (×4): 81 mg via ORAL
  Filled 2023-12-05 (×4): qty 1

## 2023-12-05 MED ORDER — PROPOFOL 10 MG/ML IV BOLUS
INTRAVENOUS | Status: DC | PRN
  Administered 2023-12-05: 200 mg via INTRAVENOUS

## 2023-12-05 MED ORDER — OXYCODONE HCL 5 MG PO TABS: 10.0000 mg | ORAL_TABLET | ORAL | Status: DC | PRN

## 2023-12-05 MED ORDER — METOCLOPRAMIDE HCL 5 MG PO TABS: 5.0000 mg | ORAL_TABLET | Freq: Three times a day (TID) | ORAL | Status: DC | PRN

## 2023-12-05 MED ORDER — VANCOMYCIN HCL 1000 MG IV SOLR
INTRAVENOUS | Status: DC | PRN
  Administered 2023-12-05: 1000 mg via TOPICAL

## 2023-12-05 MED ORDER — AMISULPRIDE (ANTIEMETIC) 5 MG/2ML IV SOLN: 10.0000 mg | Freq: Once | INTRAVENOUS | Status: DC | PRN

## 2023-12-05 MED ORDER — METHOCARBAMOL 1000 MG/10ML IJ SOLN: 500.0000 mg | Freq: Four times a day (QID) | INTRAMUSCULAR | Status: DC | PRN

## 2023-12-05 MED ORDER — SODIUM CHLORIDE 0.9% FLUSH: 3.0000 mL | Freq: Two times a day (BID) | INTRAVENOUS | Status: DC

## 2023-12-05 MED ORDER — METOCLOPRAMIDE HCL 5 MG/ML IJ SOLN: 5.0000 mg | Freq: Three times a day (TID) | INTRAMUSCULAR | Status: DC | PRN

## 2023-12-05 MED ORDER — LACTATED RINGERS IV SOLN: INTRAVENOUS | Status: DC | PRN

## 2023-12-05 MED ORDER — SERTRALINE HCL 100 MG PO TABS
100.0000 mg | ORAL_TABLET | Freq: Every day | ORAL | Status: DC
Start: 2023-12-06 — End: 2023-12-07
  Administered 2023-12-06 – 2023-12-07 (×2): 100 mg via ORAL
  Filled 2023-12-05 (×2): qty 1

## 2023-12-05 MED ORDER — ONDANSETRON HCL 4 MG/2ML IJ SOLN
INTRAMUSCULAR | Status: DC | PRN
  Administered 2023-12-05: 4 mg via INTRAVENOUS

## 2023-12-05 MED ORDER — SODIUM CHLORIDE 0.9 % IR SOLN
Status: DC | PRN
  Administered 2023-12-05: 3000 mL

## 2023-12-05 MED ORDER — CHLORHEXIDINE GLUCONATE 0.12 % MT SOLN
15.0000 mL | Freq: Once | OROMUCOSAL | Status: AC
  Administered 2023-12-05: 15 mL via OROMUCOSAL

## 2023-12-05 MED ORDER — OXYCODONE HCL 5 MG PO TABS: 5.0000 mg | ORAL_TABLET | Freq: Once | ORAL | Status: DC | PRN

## 2023-12-05 MED ORDER — OXYCODONE HCL 5 MG PO TABS: 5.0000 mg | ORAL_TABLET | ORAL | Status: DC | PRN

## 2023-12-05 MED ORDER — DEXAMETHASONE SODIUM PHOSPHATE 10 MG/ML IJ SOLN
10.0000 mg | Freq: Once | INTRAMUSCULAR | Status: AC
  Administered 2023-12-06: 10 mg via INTRAVENOUS
  Filled 2023-12-05: qty 1

## 2023-12-05 MED ORDER — FUROSEMIDE 20 MG PO TABS: 20.0000 mg | ORAL_TABLET | Freq: Every day | ORAL | Status: DC | PRN

## 2023-12-05 MED ORDER — OXYCODONE HCL 5 MG/5ML PO SOLN: 5.0000 mg | Freq: Once | ORAL | Status: DC | PRN

## 2023-12-05 MED ORDER — ORAL CARE MOUTH RINSE: 15.0000 mL | Freq: Once | OROMUCOSAL | Status: AC

## 2023-12-05 SURGICAL SUPPLY — 22 items
BAG COUNTER SPONGE SURGICOUNT (BAG) IMPLANT
BAG ZIPLOCK 12X15 (MISCELLANEOUS) ×1 IMPLANT
CANISTER WOUNDNEG PRESSURE 500 (CANNISTER) IMPLANT
COVER SURGICAL LIGHT HANDLE (MISCELLANEOUS) ×1 IMPLANT
CUFF TRNQT CYL 34X4.125X (TOURNIQUET CUFF) ×1 IMPLANT
DRAPE SHEET LG 3/4 BI-LAMINATE (DRAPES) ×1 IMPLANT
DRESSING PEEL AND PLC PRVNA 13 (GAUZE/BANDAGES/DRESSINGS) IMPLANT
DRSG PEEL AND PLACE PREVENA 13 (GAUZE/BANDAGES/DRESSINGS) ×1 IMPLANT
DURAPREP 26ML APPLICATOR (WOUND CARE) ×2 IMPLANT
GLOVE BIO SURGEON STRL SZ 6 (GLOVE) IMPLANT
GLOVE BIOGEL PI IND STRL 6.5 (GLOVE) IMPLANT
GLOVE BIOGEL PI IND STRL 7.5 (GLOVE) ×3 IMPLANT
GLOVE ORTHO TXT STRL SZ7.5 (GLOVE) ×2 IMPLANT
GOWN STRL REUS W/ TWL LRG LVL3 (GOWN DISPOSABLE) ×2 IMPLANT
KIT DRSG PREVENA PLUS 7DAY 125 (MISCELLANEOUS) IMPLANT
KIT TURNOVER KIT A (KITS) IMPLANT
MANIFOLD NEPTUNE II (INSTRUMENTS) ×1 IMPLANT
PACK TOTAL KNEE CUSTOM (KITS) ×1 IMPLANT
SET HNDPC FAN SPRY TIP SCT (DISPOSABLE) ×1 IMPLANT
SET PAD KNEE POSITIONER (MISCELLANEOUS) ×1 IMPLANT
SUT VIC AB 1 CT1 36 (SUTURE) ×2 IMPLANT
SUT VIC AB 2-0 CT1 TAPERPNT 27 (SUTURE) ×3 IMPLANT

## 2023-12-05 NOTE — Op Note (Unsigned)
 NAME: [name] [name], [name] A. MEDICAL RECORD NO: [id number] ACCOUNT NO: [id number] DATE OF BIRTH: 07-Feb-1951 FACILITY: [facility]ATION: [id number] PHYSICIAN: Madlyn Frankel. Charlann Boxer, MD  Operative Report   DATE OF PROCEDURE: 12/05/2023  PREOPERATIVE DIAGNOSIS:  Complex surgical history involving his right knee resulting from an infected primary total knee arthroplasty status post resection with reimplantation with nonhealing surgical wound.  POSTOPERATIVE DIAGNOSIS:  Complex surgical history involving his right knee resulting from an infected primary total knee arthroplasty status post resection with reimplantation with nonhealing surgical wound.  PROCEDURE:  Excisional and non-excisional debridement of right knee. Excisional debridement was carried out sharply with a scalpel and Bovie over a 4-inch incision including skin, subcutaneous, nonviable tissue.  Non-excisional debridement was carried  out with 3 liters of normal saline solution, 1 bottle of Surgiphor Betadine solution as well as a bottle of Prontosan antimicrobial solution.  Next, on primary wound closure with 2-0 nylon with placement of a Prevena wound suction system.  SURGEON:  Madlyn Frankel. Charlann Boxer, MD  ASSISTANT:  Rosalene Billings, PA-C.  Note that PA Domenic Schwab was present for the entirety of the case from preoperative position, perioperative management of the operative extremity, general facilitation of the case and primary wound closure.  ANESTHESIA:  Regional plus general.  BLOOD LOSS:  Less than 100 mL.  TOURNIQUET:  Tourniquet was up at 225 mmHg for 7 minutes.  INDICATIONS FOR THE PROCEDURE:  The patient is a very pleasant 73 year old male with a history of right total knee arthroplasty from 2024.  This was complicated by infection.  He is status post resection and reimplantation of his right total knee  replacement.  During the postoperative period related to the debridement necessary for surgery and exposure, he had recurrent seroma formation.   After trying to address this in the office, we brought him to the operating room to use a small wound VAC,  which was in place for approximately 4 weeks.  He was taken to the operating room approximately 3 weeks ago for closure without signs of infection.  We closed his wound with nylon sutures.  He has had persistent serous drainage from his wound.  We  discussed in the office at length the concern for his wound healing and wanted to aggressively treat this.  We plan to bring him back to the operating room today for I and D of his wound, primary wound closure, and antibiotics to see if we can get his  body to heal this up.  Risks of persistent wound-related issues or infection involving his superficial aspect of the knee or deep wound were reviewed.  Consent was obtained for management of the wound.  DESCRIPTION OF PROCEDURE:  The patient was brought to the operative theater.  Once adequate anesthesia was established including regional block and general anesthesia as well as preoperative antibiotics of Ancef and tranexamic acid, he was positioned  supine with a right thigh.  The tourniquet was placed.  The right lower extremity was prepped and draped in a sterile fashion.  A timeout was performed identifying the patient, planned procedure and extremity.  The leg was exsanguinated and the  tourniquet elevated to 225 mmHg.  At this point, I excised his old incision of about a 4-inch length incision.  Sharply using the knife I excised nonviable skin and soft tissue.  As we entered into the knee, he had a serous-type drainage with  inflammatory-type tissue, which was debrided sharply with a combination of the knife, a Cobb elevator as well as  the Bovie cautery.  After this debridement, I did let the tourniquet down after 7 minutes to try to observe for any significant bleeding  based on his postoperative seromas.  After we did the excisional debridement, we used 3 liters of normal saline solution in total, 1  bottle of Surgiphor Betadine solution and 1 bottle of Prontosan solution for a non-excisional debridement with fluid.   Once this was done, I used 2-0 nylon sutures to reapproximate the skin in an interrupted horizontal mattress type sutures.  I then placed 1 g of vancomycin into the wound.  The remainder of the wound was closed.  At this point, we cleaned and dried his  leg and applied a Prevena wound VAC dressing sponge over the incision site.  This was hooked to suction with good seal.  Once this was done, he was awoken from anesthesia and brought to the recovery room in stable condition and tolerating the procedure  well.  He will be admitted to the hospital.  We will order a PICC line.  As discussed with him, I would like to be aggressive and treat him with 6 weeks of IV antibiotics.  We will then transition him to oral suppressive antibiotics with the goal of joint  preservation is reviewed.   PUS D: 12/05/2023 4:20:54 pm T: 12/05/2023 7:07:00 pm  JOB: [id number]/ [id number]

## 2023-12-05 NOTE — Discharge Summary (Signed)
 Patient ID: [name] [name] MRN: [id number] DOB/AGE: 1951-08-15 73 y.o.  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   Discharge Diagnoses:  Principal Problem:   Postoperative seroma of subcutaneous tissue after non-dermatologic procedure Active Problems:   Postoperative seroma of musculoskeletal structure after musculoskeletal procedure   Past Medical History:  Diagnosis Date   Arthritis    Bronchitis    Depression    GERD (gastroesophageal reflux disease)    Hypertension    Skin cancer     Surgeries: Procedure(s): RIGHT KNEE IRRIGATION AND DEBRIDEMENT AND WOUND CLOSURE on 11/14/2023   Consultants:   Discharged Condition: Improved  Hospital Course: [name] [name] is an 73 y.o. male who was admitted 11/14/2023 for operative treatment ofPostoperative seroma of subcutaneous tissue after non-dermatologic procedure. Patient has severe unremitting pain that affects sleep, daily activities, and work/hobbies. After pre-op clearance the patient was taken to the operating room on 11/14/2023 and underwent  Procedure(s): RIGHT KNEE IRRIGATION AND DEBRIDEMENT AND WOUND CLOSURE.    Patient was given perioperative antibiotics:  Anti-infectives (From admission, onward)    Start     Dose/Rate Route Frequency Ordered Stop   11/15/23 1000  cefadroxil (DURICEF) capsule 500 mg  Status:  Discontinued        500 mg Oral 2 times daily 11/14/23 1751 11/15/23 1606   11/15/23 0600  ceFAZolin (ANCEF) IVPB 2g/100 mL premix  Status:  Discontinued        2 g 200 mL/hr over 30 Minutes Intravenous On call to O.R. 11/14/23 1345 11/14/23 1743   11/14/23 2130  ceFAZolin (ANCEF) IVPB 2g/100 mL premix        2 g 200 mL/hr over 30 Minutes Intravenous Every 6 hours 11/14/23 1751 11/15/23 0714    11/14/23 1558  vancomycin (VANCOCIN) powder  Status:  Discontinued          As needed 11/14/23 1558 11/14/23 1743        Patient was given sequential compression devices, early ambulation, and chemoprophylaxis to prevent DVT. Patient worked with PT and was meeting their goals regarding safe ambulation and transfers.  Patient benefited maximally from hospital stay and there were no complications.    Recent vital signs: No data found.   Recent laboratory studies: No results for input(s): "WBC", "HGB", "HCT", "PLT", "NA", "K", "CL", "CO2", "BUN", "CREATININE", "GLUCOSE", "INR", "CALCIUM" in the last 72 hours.  Invalid input(s): "PT", "2"   Discharge Medications:   Allergies as of 11/15/2023       Reactions   Avapro [irbesartan] Cough   Chlorhexidine Rash   Reaction to PICC line dressing with Chlorhexidine gel Not allergic to Chlorhexidine soap   Lexapro [escitalopram Oxalate] Anxiety   insomnia        Medication List     TAKE these medications    ALLERGY EYE DROPS OP Place 1 drop into both eyes daily as needed (red/itchy eyes).   benzonatate 200 MG capsule Commonly known as: TESSALON Take 1 capsule (200 mg total) by mouth 3 (three) times daily as needed for cough.   calcium carbonate 500 MG chewable tablet Commonly known as: TUMS - dosed in mg elemental calcium Chew 2 tablets by mouth 2 (two) times daily as needed for indigestion or heartburn.   cefadroxil 500 MG capsule Commonly known as: DURICEF Take 1 capsule (500  mg total) by mouth 2 (two) times daily.   furosemide 20 MG tablet Commonly known as: Lasix Take 1 tablet (20 mg total) by mouth daily as needed for fluid or edema.   gabapentin 300 MG capsule Commonly known as: NEURONTIN Take 300 mg by mouth 2 (two) times daily.   methocarbamol 500 MG tablet Commonly known as: ROBAXIN Take 1 tablet (500 mg total) by mouth every 6 (six) hours as needed for muscle spasms.   naproxen 500 MG tablet Commonly  known as: NAPROSYN Take 500 mg by mouth daily.   oxyCODONE 5 MG immediate release tablet Commonly known as: Oxy IR/ROXICODONE Take 1 tablet (5 mg total) by mouth every 4 (four) hours as needed for severe pain (pain score 7-10).   polyethylene glycol 17 g packet Commonly known as: MIRALAX / GLYCOLAX Take 17 g by mouth 2 (two) times daily. What changed:  when to take this reasons to take this   saccharomyces boulardii 250 MG capsule Commonly known as: FLORASTOR Take 250 mg by mouth daily.   sertraline 100 MG tablet Commonly known as: ZOLOFT Take 1 tablet (100 mg total) by mouth daily.   sodium chloride 0.65 % Soln nasal spray Commonly known as: OCEAN Place 1 spray into both nostrils daily as needed for congestion.   tamsulosin 0.4 MG Caps capsule Commonly known as: FLOMAX Take 0.4 mg by mouth at bedtime.   telmisartan 80 MG tablet Commonly known as: MICARDIS Take 1 tablet (80 mg total) by mouth daily.               Discharge Care Instructions  (From admission, onward)           Start     Ordered   11/15/23 0000  Change dressing       Comments: Maintain surgical dressing until follow up in the clinic. If the edges start to pull up, may reinforce with tape. If the dressing is no longer working, may remove and cover with gauze and tape, but must keep the area dry and clean.  Call with any questions or concerns.   11/15/23 0800            Diagnostic Studies: DG Chest 2 View Result Date: 11/07/2023 CLINICAL DATA:  Cough. EXAM: CHEST - 2 VIEW COMPARISON:  Chest radiograph dated 09/02/2022. FINDINGS: No focal consolidation, pleural effusion, or pneumothorax. The cardiac silhouette is within normal limits. No acute osseous pathology. IMPRESSION: No active cardiopulmonary disease. Electronically Signed   By: Elgie Collard M.D.   On: 11/07/2023 12:15    Disposition: Discharge disposition: 01-Home or Self Care       Discharge Instructions     Call MD /  Call 911   Complete by: As directed    If you experience chest pain or shortness of breath, CALL 911 and be transported to the hospital emergency room.  If you develope a fever above 101 F, pus (white drainage) or increased drainage or redness at the wound, or calf pain, call your surgeon's office.   Change dressing   Complete by: As directed    Maintain surgical dressing until follow up in the clinic. If the edges start to pull up, may reinforce with tape. If the dressing is no longer working, may remove and cover with gauze and tape, but must keep the area dry and clean.  Call with any questions or concerns.   Constipation Prevention   Complete by: As directed    Drink plenty of fluids.  Prune juice may be helpful.  You may use a stool softener, such as Colace (over the counter) 100 mg twice a day.  Use MiraLax (over the counter) for constipation as needed.   Diet - low sodium heart healthy   Complete by: As directed    Increase activity slowly as tolerated   Complete by: As directed    Weight bearing as tolerated with assist device (walker, cane, etc) as directed, use it as long as suggested by your surgeon or therapist, typically at least 4-6 weeks.   Post-operative opioid taper instructions:   Complete by: As directed    POST-OPERATIVE OPIOID TAPER INSTRUCTIONS: It is important to wean off of your opioid medication as soon as possible. If you do not need pain medication after your surgery it is ok to stop day one. Opioids include: Codeine, Hydrocodone(Norco, Vicodin), Oxycodone(Percocet, oxycontin) and hydromorphone amongst others.  Long term and even short term use of opiods can cause: Increased pain response Dependence Constipation Depression Respiratory depression And more.  Withdrawal symptoms can include Flu like symptoms Nausea, vomiting And more Techniques to manage these symptoms Hydrate well Eat regular healthy meals Stay active Use relaxation techniques(deep  breathing, meditating, yoga) Do Not substitute Alcohol to help with tapering If you have been on opioids for less than two weeks and do not have pain than it is ok to stop all together.  Plan to wean off of opioids This plan should start within one week post op of your joint replacement. Maintain the same interval or time between taking each dose and first decrease the dose.  Cut the total daily intake of opioids by one tablet each day Next start to increase the time between doses. The last dose that should be eliminated is the evening dose.      TED hose   Complete by: As directed    Use stockings (TED hose) for 2 weeks on both leg(s).  You may remove them at night for sleeping.        Follow-up Information     Durene Romans, MD. Schedule an appointment as soon as possible for a visit in 2 week(s).   Specialty: Orthopedic Surgery Contact information: [address] [place] [address] [phone]                  Signed: Cassandria Anger 12/05/2023, 4:08 PM

## 2023-12-05 NOTE — Interval H&P Note (Signed)
 History and Physical Interval Note:  12/05/2023 1:47 PM  [name] [name]  has presented today for surgery, with the diagnosis of Status post right total knee revision, non healing surgical wound.  The various methods of treatment have been discussed with the patient and family. After consideration of risks, benefits and other options for treatment, the patient has consented to  Procedure(s) with comments: IRRIGATION AND DEBRIDEMENT KNEE (Right) - 1 HR as a surgical intervention.  The patient's history has been reviewed, patient examined, no change in status, stable for surgery.  I have reviewed the patient's chart and labs.  Questions were answered to the patient's satisfaction.     [name]

## 2023-12-05 NOTE — Transfer of Care (Signed)
 Immediate Anesthesia Transfer of Care Note  Patient: [name]  Procedure(s) Performed: IRRIGATION AND DEBRIDEMENT KNEE (Right: Knee)  Patient Location: PACU  Anesthesia Type:General  Level of Consciousness: awake, alert , and oriented  Airway & Oxygen Therapy: Patient Spontanous Breathing and Patient connected to face mask oxygen  Post-op Assessment: Report given to RN and Post -op Vital signs reviewed and stable  Post vital signs: Reviewed and stable  Last Vitals:  Vitals Value Taken Time  BP 148/75 12/05/23 1603  Temp    Pulse 63 12/05/23 1604  Resp 12 12/05/23 1604  SpO2 100 % 12/05/23 1604  Vitals shown include unfiled device data.  Last Pain:  Vitals:   12/05/23 1405  TempSrc: Oral  PainSc: 0-No pain         Complications: No notable events documented.

## 2023-12-05 NOTE — Anesthesia Preprocedure Evaluation (Addendum)
 Anesthesia Evaluation  Patient identified by MRN, date of birth, ID band Patient awake    Reviewed: Allergy & Precautions, NPO status , Patient's Chart, lab work & pertinent test results  Airway Mallampati: II  TM Distance: >3 FB Neck ROM: Full    Dental no notable dental hx.    Pulmonary neg pulmonary ROS   Pulmonary exam normal        Cardiovascular hypertension, Pt. on medications Normal cardiovascular exam     Neuro/Psych  PSYCHIATRIC DISORDERS  Depression    negative neurological ROS     GI/Hepatic Neg liver ROS,,,  Endo/Other  negative endocrine ROS    Renal/GU negative Renal ROS     Musculoskeletal  (+) Arthritis ,    Abdominal  (+) + obese  Peds  Hematology negative hematology ROS (+)   Anesthesia Other Findings Right knee seroma  Reproductive/Obstetrics                             Anesthesia Physical Anesthesia Plan  ASA: 2  Anesthesia Plan: General   Post-op Pain Management: Ofirmev IV (intra-op)* and Regional block*   Induction: Intravenous  PONV Risk Score and Plan: 2 and Ondansetron, Dexamethasone, Midazolam and Treatment may vary due to age or medical condition  Airway Management Planned: LMA  Additional Equipment:   Intra-op Plan:   Post-operative Plan: Extubation in OR  Informed Consent: I have reviewed the patients History and Physical, chart, labs and discussed the procedure including the risks, benefits and alternatives for the proposed anesthesia with the patient or authorized representative who has indicated his/her understanding and acceptance.     Dental advisory given  Plan Discussed with: CRNA  Anesthesia Plan Comments:        Anesthesia Quick Evaluation

## 2023-12-05 NOTE — Anesthesia Procedure Notes (Signed)
 Anesthesia Regional Block: Adductor canal block   Pre-Anesthetic Checklist: , timeout performed,  Correct Patient, Correct Site, Correct Laterality,  Correct Procedure, Correct Position, site marked,  Risks and benefits discussed,  Surgical consent,  Pre-op evaluation,  At surgeon's request and post-op pain management  Laterality: Right  Prep: chloraprep       Needles:  Injection technique: Single-shot  Needle Type: Echogenic Needle     Needle Length: 9cm  Needle Gauge: 21     Additional Needles:   Procedures:,,,, ultrasound used (permanent image in chart),,    Narrative:  Start time: 12/05/2023 2:00 PM End time: 12/05/2023 2:05 PM Injection made incrementally with aspirations every 5 mL.  Performed by: Personally  Anesthesiologist: Marcene Duos, MD

## 2023-12-05 NOTE — H&P (Signed)
 TOTAL KNEE I&D ADMISSION H&P   Patient is being admitted for right knee I&D with wound vac application   Therapy Plans: outpatient therapy at [facility] Disposition: Home with wife Planned DVT Prophylaxis: aspirin 81mg  BID DME needed: none PCP: Dr. Okey Dupre, clearance received  TXA: IV Allergies: NKDA Anesthesia Concerns: none BMI: 34.1 Last HgbA1c: Not diabetic      Other: - oxycodone, tizanidine, tylenol, celebrex  - No hx of VTE or cancer - Very active       Subjective:   Chief Complaint:right knee pain.   HPI: [name] [name], 73 y.o. male, has a complex history with his right knee. He had primary TKA with PJI in early 2024 which was then treated with 2 stage revision in May/August 2024. He has recovered well from this in general with no signs of recurrent infection. He has full extension and flexion past 90 degrees. He unfortunately has had recurrent prepatellar seromas, and was treated with a wound vac.  He is now about 3 weeks since we took him back to the operating room to washout his knee wound and close his primary incision.  He has been followed in the office with persistent serous drainage from his incision indicating nonhealing wound.  We had a very lengthy discussion regarding the condition of his knee and next steps.  We have agreed to return to the operating room to washout his knee wound closed primarily and try a IV course of antibiotics followed by p.o. suppressive antibiotics with a goal of joint preservation.          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   Infection of right knee (HCC) 11/14/2022   Chronic sinusitis 09/02/2022   Encounter for general adult medical examination with abnormal findings 07/09/2021   BPH (benign prostatic hyperplasia) 07/09/2021   MDD (major depressive disorder), recurrent, in full  remission (HCC) 07/09/2021   Essential hypertension 10/20/2008           Past Medical History:  Diagnosis Date   Arthritis     Bronchitis     Depression     GERD (gastroesophageal reflux disease)     Hypertension     Skin cancer                    Past Surgical History:  Procedure Laterality Date   CHOLECYSTECTOMY N/A 02/10/2021    Procedure: LAPAROSCOPIC CHOLECYSTECTOMY;  Surgeon: Harriette Bouillon, MD;  Location: [facility];  Service: General;  Laterality: N/A;   EXCISIONAL TOTAL KNEE ARTHROPLASTY WITH ANTIBIOTIC SPACERS Right 02/21/2023    Procedure: EXCISIONAL TOTAL KNEE ARTHROPLASTY WITH ANTIBIOTIC SPACERS;  Surgeon: Durene Romans, MD;  Location: [facility];  Service: Orthopedics;  Laterality: Right;   HERNIA REPAIR       I & D KNEE WITH POLY EXCHANGE Right 11/15/2022    Procedure: IRRIGATION AND DEBRIDEMENT KNEE WITH  POLY EXCHANGE;  Surgeon: Eugenia Mcalpine, MD;  Location: [facility];  Service: Orthopedics;  Laterality: Right;  adductor canal, no antibiotics prior to culture add on room to follow other case 61   Knee Surgery Right 10/25/2022   REIMPLANTATION OF TOTAL KNEE Right 06/01/2023    Procedure: REIMPLANTATION/REVISION OF TOTAL KNEE;  Surgeon: Durene Romans, MD;  Location: [facility];  Service: Orthopedics;  Laterality: Right;   ROTATOR CUFF REPAIR  SKIN CANCER EXCISION             No current facility-administered medications for this encounter.                   Current Outpatient Medications  Medication Sig Dispense Refill Last Dose/Taking   calcium carbonate (TUMS - DOSED IN MG ELEMENTAL CALCIUM) 500 MG chewable tablet Chew 2 tablets by mouth 2 (two) times daily as needed for indigestion or heartburn.     Taking As Needed   gabapentin (NEURONTIN) 300 MG capsule Take 300 mg by mouth 2 (two) times daily.     Taking   Ketotifen Fumarate (ALLERGY EYE DROPS OP) Place 1 drop into both eyes daily as needed (red/itchy eyes).     Taking As Needed   naproxen (NAPROSYN) 500 MG  tablet Take 500 mg by mouth daily.     Taking   polyethylene glycol (MIRALAX / GLYCOLAX) 17 g packet Take 17 g by mouth 2 (two) times daily. (Patient taking differently: Take 17 g by mouth daily as needed for mild constipation or moderate constipation.) 14 each 0 Taking Differently   saccharomyces boulardii (FLORASTOR) 250 MG capsule Take 250 mg by mouth daily.     Taking   sertraline (ZOLOFT) 100 MG tablet Take 1 tablet (100 mg total) by mouth daily. 90 tablet 3 Taking   sodium chloride (OCEAN) 0.65 % SOLN nasal spray Place 1 spray into both nostrils daily as needed for congestion.     Taking As Needed   tamsulosin (FLOMAX) 0.4 MG CAPS capsule Take 0.4 mg by mouth at bedtime.   0 Taking   telmisartan (MICARDIS) 80 MG tablet Take 1 tablet (80 mg total) by mouth daily. 90 tablet 3 Taking      Allergies           Allergies  Allergen Reactions   Avapro [Irbesartan] Cough   Chlorhexidine Rash      Reaction to PICC line dressing with Chlorhexidine gel Not allergic to Chlorhexidine soap   Lexapro [Escitalopram Oxalate] Anxiety      insomnia      Social History             Tobacco Use   Smoking status: Never   Smokeless tobacco: Never  Substance Use Topics   Alcohol use: Yes      Comment: socially             Family History  Problem Relation Age of Onset   Emphysema Mother          heart failure indusce emphysema   Heart disease Father     Heart attack Father     Heart disease Sister 68        [id number]           Review of Systems  Constitutional:  Negative for chills and fever.  Respiratory:  Negative for cough and shortness of breath.   Cardiovascular:  Negative for chest pain.  Gastrointestinal:  Negative for nausea and vomiting.  Musculoskeletal:  Positive for arthralgias.        Objective:   Physical Exam Right knee exam: Proximal third of his incision with mild serous drainage.     Labs:         Imaging Review No interval imaging            Assessment/Plan:   Complex surgical history involving his right knee most recently status post reimplantation of her right knee with recurrent seromas with  wound management occluding wound VAC and subsequent primary closure with persistent drainage and nonhealing wound.   The patient history, physical examination, clinical judgment of the provider and imaging studies are consistent with prepatellar seroma of the right knee(s) and I&D is deemed medically necessary. The treatment options including medical management, injection therapy arthroscopy and arthroplasty were discussed at length. The risks and benefits of total knee arthroplasty were presented and reviewed. The risks due to aseptic loosening, infection, stiffness, thromboembolic complications and other imponderables were discussed. The patient acknowledged the explanation, agreed to proceed with the plan and consent was signed. Patient is being admitted for inpatient treatment for surgery, pain control, PT, OT, prophylactic antibiotics, VTE prophylaxis, progressive ambulation and ADL's and discharge planning. The patient is planning to be discharged  home.  We discussed in the office that we would likely at this point get a PICC line placed for 6 weeks of IV antibiotics followed by oral suppressive antibiotics.  Discharge will be dependent on arrangement of the PICC line as well as wound management issues while in the hospital.

## 2023-12-05 NOTE — Discharge Instructions (Signed)

## 2023-12-05 NOTE — Brief Op Note (Signed)
 12/05/2023  4:13 PM  PATIENT:  [name] [name]  73 y.o. male  PRE-OPERATIVE DIAGNOSIS:  Status post right total knee revision, non healing surgical wound  POST-OPERATIVE DIAGNOSIS:  Status post right total knee revision, non healing surgical wound  PROCEDURE:  Procedure(s) with comments: IRRIGATION AND DEBRIDEMENT KNEE (Right) - 1 HR  SURGEON:  Surgeons and Role:    Durene Romans, MD - Primary  PHYSICIAN ASSISTANT: Rosalene Billings, PA-C  ANESTHESIA:   regional and general  EBL:  50 mL   BLOOD ADMINISTERED:none  DRAINS:  Prevana wound vac closure system    LOCAL MEDICATIONS USED:  Vancomycin powder, 1 gm placed in wound  SPECIMEN:  No Specimen  DISPOSITION OF SPECIMEN:  N/A  COUNTS:  YES  TOURNIQUET:   Total Tourniquet Time Documented: Thigh (Right) - 7 minutes Total: Thigh (Right) - 7 minutes   DICTATION: .Other Dictation: Dictation Number [id number]  PLAN OF CARE: Admit for overnight observation  PATIENT DISPOSITION:  PACU - hemodynamically stable.   Delay start of Pharmacological VTE agent (>24hrs) due to surgical blood loss or risk of bleeding: no

## 2023-12-05 NOTE — Progress Notes (Signed)
 Pt would like to redo Medical POA  & Living Will while in the hospital - current one is not notarized

## 2023-12-05 NOTE — Anesthesia Procedure Notes (Signed)
 Procedure Name: LMA Insertion Date/Time: 12/05/2023 3:04 PM  Performed by: Kizzie Fantasia, CRNAPre-anesthesia Checklist: Patient identified, Suction available, Emergency Drugs available, Patient being monitored and Timeout performed Patient Re-evaluated:Patient Re-evaluated prior to induction Preoxygenation: Pre-oxygenation with 100% oxygen Induction Type: IV induction Ventilation: Mask ventilation without difficulty LMA: LMA inserted LMA Size: 5.0 Number of attempts: 1 Placement Confirmation: positive ETCO2 and breath sounds checked- equal and bilateral Tube secured with: Tape Dental Injury: Teeth and Oropharynx as per pre-operative assessment

## 2023-12-05 NOTE — Anesthesia Postprocedure Evaluation (Signed)
 Anesthesia Post Note  Patient: [name]  Procedure(s) Performed: IRRIGATION AND DEBRIDEMENT KNEE (Right: Knee)     Patient location during evaluation: PACU Anesthesia Type: General Level of consciousness: awake and alert Pain management: pain level controlled Vital Signs Assessment: post-procedure vital signs reviewed and stable Respiratory status: spontaneous breathing, nonlabored ventilation, respiratory function stable and patient connected to nasal cannula oxygen Cardiovascular status: blood pressure returned to baseline and stable Postop Assessment: no apparent nausea or vomiting Anesthetic complications: no  No notable events documented.  Last Vitals:  Vitals:   12/05/23 1603 12/05/23 1615  BP: (!) 148/75 112/79  Pulse: 63 73  Resp: 12 (!) 25  Temp: 36.6 C   SpO2: 100% 100%    Last Pain:  Vitals:   12/05/23 1615  TempSrc:   PainSc: 0-No pain                 Kennieth Rad

## 2023-12-06 ENCOUNTER — Encounter (HOSPITAL_COMMUNITY): Payer: Self-pay | Admitting: Orthopedic Surgery

## 2023-12-06 DIAGNOSIS — T8453XA Infection and inflammatory reaction due to internal right knee prosthesis, initial encounter: Secondary | ICD-10-CM | POA: Diagnosis not present

## 2023-12-06 DIAGNOSIS — Z8619 Personal history of other infectious and parasitic diseases: Secondary | ICD-10-CM

## 2023-12-06 DIAGNOSIS — Z96651 Presence of right artificial knee joint: Secondary | ICD-10-CM | POA: Diagnosis not present

## 2023-12-06 DIAGNOSIS — Z79899 Other long term (current) drug therapy: Secondary | ICD-10-CM | POA: Diagnosis not present

## 2023-12-06 DIAGNOSIS — Z85828 Personal history of other malignant neoplasm of skin: Secondary | ICD-10-CM | POA: Diagnosis not present

## 2023-12-06 DIAGNOSIS — I10 Essential (primary) hypertension: Secondary | ICD-10-CM | POA: Diagnosis not present

## 2023-12-06 LAB — BASIC METABOLIC PANEL
Anion gap: 9 (ref 5–15)
BUN: 19 mg/dL (ref 8–23)
CO2: 24 mmol/L (ref 22–32)
Calcium: 8.4 mg/dL — ABNORMAL LOW (ref 8.9–10.3)
Chloride: 101 mmol/L (ref 98–111)
Creatinine, Ser: 1.03 mg/dL (ref 0.61–1.24)
GFR, Estimated: >60 mL/min (ref >60)
Glucose, Bld: 179 mg/dL — ABNORMAL HIGH (ref 70–99)
Potassium: 4 mmol/L (ref 3.5–5.1)
Sodium: 134 mmol/L — ABNORMAL LOW (ref 135–145)

## 2023-12-06 LAB — CBC
HCT: 35.2 % — ABNORMAL LOW (ref 39.0–52.0)
Hemoglobin: 10.9 g/dL — ABNORMAL LOW (ref 13.0–17.0)
MCH: 27.9 pg (ref 26.0–34.0)
MCHC: 31 g/dL (ref 30.0–36.0)
MCV: 90 fL (ref 80.0–100.0)
Platelets: 204 10*3/uL (ref 150–400)
RBC: 3.91 MIL/uL — ABNORMAL LOW (ref 4.22–5.81)
RDW: 15.7 % — ABNORMAL HIGH (ref 11.5–15.5)
WBC: 5.8 10*3/uL (ref 4.0–10.5)
nRBC: 0 % (ref 0.0–0.2)

## 2023-12-06 LAB — CK: Total CK: 40 U/L — ABNORMAL LOW (ref 49–397)

## 2023-12-06 LAB — SEDIMENTATION RATE: Sed Rate: 34 mm/h — ABNORMAL HIGH (ref 0–16)

## 2023-12-06 LAB — C-REACTIVE PROTEIN: CRP: 5.1 mg/dL — ABNORMAL HIGH (ref <1.0)

## 2023-12-06 MED ORDER — SODIUM CHLORIDE 0.9% FLUSH: 10.0000 mL | INTRAVENOUS | Status: DC | PRN

## 2023-12-06 MED ORDER — SODIUM CHLORIDE 0.9 % IV SOLN
2.0000 g | Freq: Every day | INTRAVENOUS | Status: DC
  Administered 2023-12-06 – 2023-12-07 (×2): 2 g via INTRAVENOUS
  Filled 2023-12-06 (×2): qty 20

## 2023-12-06 MED ORDER — DAPTOMYCIN-SODIUM CHLORIDE 700-0.9 MG/100ML-% IV SOLN
8.0000 mg/kg | Freq: Every day | INTRAVENOUS | Status: DC
  Administered 2023-12-06 – 2023-12-07 (×2): 700 mg via INTRAVENOUS
  Filled 2023-12-06 (×2): qty 100

## 2023-12-06 MED ORDER — METRONIDAZOLE 500 MG PO TABS
500.0000 mg | ORAL_TABLET | Freq: Two times a day (BID) | ORAL | Status: DC
  Administered 2023-12-06 – 2023-12-07 (×3): 500 mg via ORAL
  Filled 2023-12-06 (×3): qty 1

## 2023-12-06 NOTE — Care Management Obs Status (Signed)
 MEDICARE OBSERVATION STATUS NOTIFICATION   Patient Details  Name: [name] [name] MRN: [id number] Date of Birth: 02-Jul-1951   Medicare Observation Status Notification Given:  Hart Robinsons, LCSW 12/06/2023, 3:38 PM

## 2023-12-06 NOTE — Progress Notes (Signed)
 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 To discharging provider:  please sign these orders via discharge navigator,  Select New Orders & click on the button choice - Manage This Unsigned Work.     Thank you for allowing pharmacy to be a part of this patient's care.  Georgina Pillion, PharmD, BCPS, BCIDP Infectious Diseases Clinical Pharmacist 12/06/2023 11:22 AM   **Pharmacist phone directory can now be found on [email or web address] ([email or web address]).  Listed under [facility].

## 2023-12-06 NOTE — Evaluation (Signed)
 Physical Therapy Evaluation Patient Details Name: [name] [name] MRN: [id number] DOB: [birth year] Today's Date: 12/06/2023  History of Present Illness  Pt is 73 yo male admitted on 12/05/23 for R knee I and D.  Pt with complex history with R knee involving primary TKA early 2024 followed by 2 stage revision May/August 2024, then recurrent prepatellar seromas treated with wound vac with prior washout and closure on 11/14/23.  Now with persistant drainage requiring I and D. Other hx includes but not limited to arthritis, HTN  Clinical Impression  Pt admitted with above diagnosis. At baseline, pt independent and active.  He had resumed walking without AD and had ongoing outpt PT.  Pt has support at home and all necessary DME.  Today, pt with good quad activation but does have significant edema R knee (reports present prior).  He is completing transfers safely with supervision for lines only.  Pt ambulated 200' with RW and supervision.  Pt expected to progress well.  Did caution on gentle ROM only at this time due to complex history - he verbalized understanding and agrees.  Pt currently with functional limitations due to the deficits listed below (see PT Problem List). Pt will benefit from acute skilled PT to increase their independence and safety with mobility to allow discharge.           If plan is discharge home, recommend the following: A little help with walking and/or transfers;A little help with bathing/dressing/bathroom   Can travel by private vehicle        Equipment Recommendations None recommended by PT  Recommendations for Other Services       Functional Status Assessment Patient has had a recent decline in their functional status and demonstrates the ability to make significant improvements in function in a reasonable and predictable amount of time.     Precautions / Restrictions Precautions Precautions: Knee Restrictions RLE Weight Bearing Per Provider Order: Weight bearing as  tolerated      Mobility  Bed Mobility Overal bed mobility: Modified Independent Bed Mobility: Supine to Sit, Sit to Supine     Supine to sit: Modified independent (Device/Increase time) Sit to supine: Modified independent (Device/Increase time)        Transfers Overall transfer level: Modified independent Equipment used: Rolling walker (2 wheels) Transfers: Sit to/from Stand Sit to Stand: Modified independent (Device/Increase time)           General transfer comment: Pt stood from bed and toielt safely and without difficulty    Ambulation/Gait Ambulation/Gait assistance: Supervision Gait Distance (Feet): 200 Feet Assistive device: Rolling walker (2 wheels) Gait Pattern/deviations: Step-through pattern Gait velocity: decreased but functional     General Gait Details: Step through pattern with good weight shift to R LE; steady with RW  Stairs            Wheelchair Mobility     Tilt Bed    Modified Rankin (Stroke Patients Only)       Balance Overall balance assessment: Needs assistance Sitting-balance support: No upper extremity supported Sitting balance-Leahy Scale: Normal     Standing balance support: No upper extremity supported Standing balance-Leahy Scale: Good                               Pertinent Vitals/Pain Pain Assessment Pain Assessment: 0-10 Pain Score: 3  Pain Location: R knee Pain Descriptors / Indicators: Discomfort Pain Intervention(s): Limited activity within patient's tolerance, Monitored during  session, Premedicated before session, Repositioned    Home Living Family/patient expects to be discharged to:: Private residence Living Arrangements: Spouse/significant other Available Help at Discharge: Family;Available 24 hours/day Type of Home: House Home Access: Stairs to enter Entrance Stairs-Rails: Right;Left;Can reach both Entrance Stairs-Number of Steps: 3   Home Layout: One level Home Equipment: Clinical biochemist (2 wheels);Cane - single point;Grab bars - tub/shower;Grab bars - toilet      Prior Function Prior Level of Function : Independent/Modified Independent             Mobility Comments: independent; Could ambulate without AD ADLs Comments: pt reports ind     Extremity/Trunk Assessment   Upper Extremity Assessment Upper Extremity Assessment: Overall WFL for tasks assessed    Lower Extremity Assessment Lower Extremity Assessment: LLE deficits/detail RLE Deficits / Details: Significant edema in R LE; ROM: knee grossly 5 to 60 degrees (discussed not stretching/pushing at this point due to complex hx/wound healing); MMT: ankle 5/5, knee and hip 3/5 not further tested LLE Deficits / Details: ROM WFL; MMT 5/5    Cervical / Trunk Assessment Cervical / Trunk Assessment: Normal  Communication        Cognition Arousal: Alert Behavior During Therapy: WFL for tasks assessed/performed   PT - Cognitive impairments: No apparent impairments                                 Cueing       General Comments General comments (skin integrity, edema, etc.): Has Prevena Wound Vac R knee    Exercises Other Exercises Other Exercises: Pt familiar with exercises. Perfoming ankle pumps.  Discussed could continue LAQ and knee flex with gentle ROM   Assessment/Plan    PT Assessment Patient needs continued PT services  PT Problem List Decreased mobility;Decreased range of motion;Decreased strength;Decreased balance;Decreased activity tolerance;Decreased knowledge of use of DME       PT Treatment Interventions Therapeutic exercise;DME instruction;Gait training;Stair training;Functional mobility training;Therapeutic activities;Patient/family education;Modalities    PT Goals (Current goals can be found in the Care Plan section)  Acute Rehab PT Goals Patient Stated Goal: go on walks, play golf, do yardwork PT Goal Formulation: With patient Time For Goal Achievement:  12/20/23 Potential to Achieve Goals: Good    Frequency Min 2X/week     Co-evaluation               AM-PAC PT "6 Clicks" Mobility  Outcome Measure Help needed turning from your back to your side while in a flat bed without using bedrails?: None Help needed moving from lying on your back to sitting on the side of a flat bed without using bedrails?: None Help needed moving to and from a bed to a chair (including a wheelchair)?: None Help needed standing up from a chair using your arms (e.g., wheelchair or bedside chair)?: None Help needed to walk in hospital room?: A Little Help needed climbing 3-5 steps with a railing? : A Little 6 Click Score: 22    End of Session Equipment Utilized During Treatment: Gait belt Activity Tolerance: Patient tolerated treatment well Patient left: in bed;with call bell/phone within reach;with family/visitor present Nurse Communication: Mobility status PT Visit Diagnosis: Other abnormalities of gait and mobility (R26.89)    Time: 4098-1191 PT Time Calculation (min) (ACUTE ONLY): 20 min   Charges:   PT Evaluation $PT Eval Low Complexity: 1 Low   PT General Charges $$ ACUTE PT  VISIT: 1 Visit         Anise Salvo, PT Acute Rehab Services [facility] [phone]   Rayetta Humphrey 12/06/2023, 1:18 PM

## 2023-12-06 NOTE — Consult Note (Addendum)
 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 - Appears to be on PO cefadroxil from at least January 2025 or even before   # Non healing surgical wound of rt knee   Clinically concerning for possible prosthetic joint infection in the setting of prior infection followed by nonhealing of the surgical wound although no cultures to target.  Ortho plan for 6 weeks of IV antibiotics followed by PO suppression.  Discussed with patient plan for 6 weeks IV antibiotics with possible consideration for suppression in an attempt to salvage the prosthetic joint is reasonable but does not guarantee cure if the prosthetic joint is truly infected. He is agreeable to the plan.  Recommendations  - PICC  - ESR and CRP added on - Will treat this as culture negative prosthetic joint infection with daptomycin, ceftriaxone and metronidazole for 6 weeks and possible suppression for at least 6 months thereafter.  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   Culture Result: none, prior MSSA and  finegoldia magna   Allergies  Allergen Reactions   Avapro [Irbesartan] Cough   Chlorhexidine Rash    Reaction to PICC line dressing with Chlorhexidine gel Not allergic to Chlorhexidine soap   Lexapro [Escitalopram Oxalate] Anxiety    insomnia    OPAT Orders Discharge antibiotics to be given via PICC line Discharge antibiotics: Daptomycin, ceftriaxone and metronidazole Per pharmacy protocol  Duration: 6 weeks End Date: 01/17/24  [facility] Care Per Protocol:  Home health RN for IV administration and teaching; PICC line care and labs.    Labs weekly while on IV antibiotics: X__ CBC with differential __ BMP X__ CMP X__ CRP X__ ESR __ Vancomycin trough X__ CK  __ Please pull PIC at completion of IV antibiotics X__ Please leave PIC in place until doctor has seen patient or been notified  Fax weekly labs to [phone]  Clinic Follow Up Appt: 3/20 at 10: 45 am   ID will so, please call with questions or concerns   Rest of the management as per the primary team. Please call with questions or concerns.  Thank you for the consult  __________________________________________________________________________________________________________ HPI and Hospital Course: 73 year old male with PMH as below including HTN, bronchitis, GERD, skin cancer, arthritis, depression, right knee PJI with MSSA status post I&D on  11/15/2022 treated with 8 weeks IV cefazolin followed by suppressive PO cefadroxil complicated by recurrent PJI requiring resection of right knee arthroplasty and spacer placement 02/21/23 ( OR Cx with finegoldia magna). He was initially started on Dapto and then transition to cefazolin, added metronidazole as cultures grew Finegoldia magna, EOT 04/04/23. He had knee reimplantation in 06/01/23 after which he did well for a couple of months but started having recurrent prepatellar swellings and seromas since October 2024, s/p multiple fluid aspirations by Dr Nilsa Nutting office with no cultures  take.  He was admitted 12/31-1/2 for post op seroma and underwent I and D with wound vac application on 12/31 (no cx taken). He was last admitted 2/11-2/12 for post op seroma and underwent I and D with primary wound closure and prevena wound vac on 2/11 ( no cx taken).  Per chart he has been on cefadroxil since early January although wife thinks he may have been taking cefadroxil even before that.    3/5 S/p debridement with primary wound closure and placement of Prevena wound VAC.  No cx taken.  Consulted by Ortho as well as attempting for IV antibiotics.   ROS: General- Denies fever, chills, loss of appetite and loss of weight HEENT - Denies headache, blurry vision, neck pain, sinus pain Chest - Denies any chest pain, SOB or cough CVS- Denies any dizziness/lightheadedness, syncopal attacks, palpitations Abdomen- Denies any nausea, vomiting, abdominal pain, hematochezia and diarrhea Neuro - Denies any weakness, numbness, tingling sensation Psych - Denies any changes in mood irritability or depressive symptoms GU- Denies any burning, dysuria, hematuria or increased frequency of urination Skin - denies any rashes/lesions MSK - soreness and pain in the rt knee   Past Medical History:  Diagnosis Date   Arthritis    Bronchitis    Depression    GERD (gastroesophageal reflux disease)    Hypertension    Skin cancer    Past Surgical History:  Procedure Laterality Date   APPLICATION OF WOUND VAC Right 10/03/2023   Procedure: APPLICATION OF WOUND VAC;  Surgeon: Durene Romans, MD;  Location: [facility];  Service: Orthopedics;  Laterality: Right;   CHOLECYSTECTOMY N/A 02/10/2021   Procedure: LAPAROSCOPIC CHOLECYSTECTOMY;  Surgeon: Harriette Bouillon, MD;  Location: [facility] OR;  Service: General;  Laterality: N/A;   EXCISIONAL TOTAL KNEE ARTHROPLASTY WITH ANTIBIOTIC SPACERS Right 02/21/2023   Procedure: EXCISIONAL TOTAL KNEE ARTHROPLASTY WITH ANTIBIOTIC SPACERS;  Surgeon: Durene Romans, MD;  Location: [facility]  [facility];  Service: Orthopedics;  Laterality: Right;   HERNIA REPAIR     I & D KNEE WITH POLY EXCHANGE Right 11/15/2022   Procedure: IRRIGATION AND DEBRIDEMENT KNEE WITH  POLY EXCHANGE;  Surgeon: Eugenia Mcalpine, MD;  Location: [facility];  Service: Orthopedics;  Laterality: Right;  adductor canal, no antibiotics prior to culture add on room to follow other case 60   IRRIGATION AND DEBRIDEMENT KNEE Right 10/03/2023   Procedure: IRRIGATION AND DEBRIDEMENT KNEE;  Surgeon: Durene Romans, MD;  Location: [facility];  Service: Orthopedics;  Laterality: Right;  1 HR   IRRIGATION AND DEBRIDEMENT KNEE Right 11/14/2023   Procedure: RIGHT KNEE IRRIGATION AND DEBRIDEMENT AND WOUND CLOSURE;  Surgeon: Durene Romans, MD;  Location: [facility];  Service: Orthopedics;  Laterality: Right;   IRRIGATION AND DEBRIDEMENT KNEE Right 12/05/2023   Procedure: IRRIGATION AND DEBRIDEMENT KNEE;  Surgeon: Durene Romans, MD;  Location: [facility];  Service: Orthopedics;  Laterality: Right;  1 HR   Knee Surgery Right 10/25/2022   REIMPLANTATION OF TOTAL KNEE Right  06/01/2023   Procedure: REIMPLANTATION/REVISION OF TOTAL KNEE;  Surgeon: Durene Romans, MD;  Location: [facility];  Service: Orthopedics;  Laterality: Right;   ROTATOR CUFF REPAIR     SKIN CANCER EXCISION     Scheduled Meds:  acetaminophen  1,000 mg Oral Q6H   aspirin  81 mg Oral BID   gabapentin  300 mg Oral BID   irbesartan  300 mg Oral Daily   polyethylene glycol  17 g Oral BID   saccharomyces boulardii  250 mg Oral Daily   senna  2 tablet Oral QHS   sertraline  100 mg Oral Daily   sodium chloride flush  3-10 mL Intravenous Q12H   tamsulosin  0.4 mg Oral QPC supper   Continuous Infusions:  cefTRIAXone (ROCEPHIN)  IV     PRN Meds:.alum & mag hydroxide-simeth, bisacodyl, diphenhydrAMINE, furosemide, HYDROmorphone (DILAUDID) injection, menthol-cetylpyridinium **OR** phenol, methocarbamol **OR** methocarbamol (ROBAXIN) injection, metoCLOPramide **OR** metoCLOPramide (REGLAN)  injection, ondansetron **OR** ondansetron (ZOFRAN) IV, oxyCODONE, oxyCODONE, sodium chloride flush  Allergies  Allergen Reactions   Avapro [Irbesartan] Cough   Chlorhexidine Rash    Reaction to PICC line dressing with Chlorhexidine gel Not allergic to Chlorhexidine soap   Lexapro [Escitalopram Oxalate] Anxiety    insomnia   Social History   Socioeconomic History   Marital status: Married    Spouse name: Wynona Canes   Number of children: 3   Years of education: Not on file   Highest education level: Not on file  Occupational History   Occupation: Retired.  Tobacco Use   Smoking status: Never   Smokeless tobacco: Never  Vaping Use   Vaping status: Never Used  Substance and Sexual Activity   Alcohol use: Yes    Comment: socially   Drug use: No   Sexual activity: Yes  Other Topics Concern   Not on file  Social History Narrative   Lives with wife.   Social Drivers of Corporate investment banker Strain: Low Risk  (06/26/2023)   Overall Financial Resource Strain (CARDIA)    Difficulty of Paying Living Expenses: Not very hard  Food Insecurity: No Food Insecurity (12/05/2023)   Hunger Vital Sign    Worried About Running Out of Food in the Last Year: Never true    Ran Out of Food in the Last Year: Never true  Transportation Needs: No Transportation Needs (12/05/2023)   PRAPARE - Administrator, Civil Service (Medical): No    Lack of Transportation (Non-Medical): No  Physical Activity: Inactive (06/26/2023)   Exercise Vital Sign    Days of Exercise per Week: 0 days    Minutes of Exercise per Session: 0 min  Stress: No Stress Concern Present (06/26/2023)   [employer] of Occupational Health - Occupational Stress Questionnaire    Feeling of Stress : Not at all  Social Connections: Moderately Isolated (12/05/2023)   Social Connection and Isolation Panel [NHANES]    Frequency of Communication with Friends and Family: More than three times a week    Frequency of  Social Gatherings with Friends and Family: More than three times a week    Attends Religious Services: Never    Database administrator or Organizations: No    Attends Banker Meetings: Never    Marital Status: Married  Catering manager Violence: Not At Risk (12/05/2023)   Humiliation, Afraid, Rape, and Kick questionnaire    Fear of Current or Ex-Partner: No    Emotionally Abused: No    Physically  Abused: No    Sexually Abused: No   Family History  Problem Relation Age of Onset   Emphysema Mother        heart failure indusce emphysema   Heart disease Father    Heart attack Father    Heart disease Sister 17       [id number]   Vitals BP (!) 140/77 (BP Location: Right Arm)   Pulse 63   Temp 97.9 F (36.6 C) (Oral)   Resp 16   Ht 5\' 9"  (1.753 m)   Wt 103.4 kg   SpO2 98%   BMI 33.67 kg/m    Physical Exam Constitutional: Adult male sitting in the bed, not in acute distress    Comments: HEENT WNL  Cardiovascular:     Rate and Rhythm: Normal rate and regular rhythm.     Heart sounds: S1 and S2  Pulmonary:     Effort: Pulmonary effort is normal.     Comments: Normal breath sounds  Abdominal:     Palpations: Abdomen is soft.     Tenderness: Nondistended and nontender  Musculoskeletal:        General: No swelling or tenderness in peripheral joints.  Right knee bandaged C/D/I.  Prevena with no significant output.  Neurovascular status intact  Skin:    Comments: No rashes  Neurological:     General: Awake, alert and oriented,  grossly nonfocal  Psychiatric:        Mood and Affect: Mood normal.    Pertinent Microbiology Results for orders placed or performed during the hospital encounter of 05/23/23  Surgical pcr screen     Status: Abnormal   Collection Time: 05/23/23 10:46 AM   Specimen: Nasal Mucosa; Nasal Swab  Result Value Ref Range Status   MRSA, PCR NEGATIVE NEGATIVE Final   Staphylococcus aureus POSITIVE (A) NEGATIVE Final    Comment: (NOTE) The  Xpert SA Assay (FDA approved for NASAL specimens in patients 17 years of age and older), is one component of a comprehensive surveillance program. It is not intended to diagnose infection nor to guide or monitor treatment. Performed at [facility], [address] [place], [place] [address]    Pertinent Lab seen by me:    Latest Ref Rng & Units 12/06/2023    3:24 AM 11/15/2023    3:43 AM 11/09/2023    9:59 AM  CBC  WBC 4.0 - 10.5 K/uL 5.8  5.6  3.8   Hemoglobin 13.0 - 17.0 g/dL 66.0  63.0  16.0   Hematocrit 39.0 - 52.0 % 35.2  37.1  39.4   Platelets 150 - 400 K/uL 204  222  248       Latest Ref Rng & Units 12/06/2023    3:24 AM 11/15/2023    3:43 AM 11/09/2023    9:59 AM  CMP  Glucose 70 - 99 mg/dL 109  323  557   BUN 8 - 23 mg/dL 19  15  19    Creatinine 0.61 - 1.24 mg/dL 3.22  0.25  4.27   Sodium 135 - 145 mmol/L 134  133  135   Potassium 3.5 - 5.1 mmol/L 4.0  3.8  4.2   Chloride 98 - 111 mmol/L 101  100  100   CO2 22 - 32 mmol/L 24  24  26    Calcium 8.9 - 10.3 mg/dL 8.4  8.5  8.8    Pertinent Imagings/Other Imagings Plain films and CT images have been personally visualized and interpreted;  radiology reports have been reviewed. Decision making incorporated into the Impression / Recommendations.  [name] EKG SITE RITE Result Date: 12/05/2023 If Site Rite image not attached, placement could not be confirmed due to current cardiac rhythm.  DG Chest 2 View Result Date: 11/07/2023 CLINICAL DATA:  Cough. EXAM: CHEST - 2 VIEW COMPARISON:  Chest radiograph dated 09/02/2022. FINDINGS: No focal consolidation, pleural effusion, or pneumothorax. The cardiac silhouette is within normal limits. No acute osseous pathology. IMPRESSION: No active cardiopulmonary disease. Electronically Signed   By: Elgie Collard M.D.   On: 11/07/2023 12:15   I have personally spent 84 minutes involved in face-to-face and non-face-to-face activities for this patient on the day of the visit.  Professional time spent includes the following activities: Preparing to see the patient (review of tests), Obtaining and/or reviewing separately obtained history (admission/discharge record), Performing a medically appropriate examination and/or evaluation , Ordering medications/tests/procedures, referring and communicating with other health care professionals, Documenting clinical information in the EMR, Independently interpreting results (not separately reported), Communicating results to the patient/family/caregiver, Counseling and educating the patient/family/caregiver and Care coordination (not separately reported).  Electronically signed by:   Plan d/w requesting provider as well as ID pharm D  Of note, portions of this note may have been created with voice recognition software. While this note has been edited for accuracy, occasional wrong-word or 'sound-a-like' substitutions may have occurred due to the inherent limitations of voice recognition software.   Odette Fraction, MD Infectious Disease Physician [facility] Pager: [phone]

## 2023-12-06 NOTE — TOC Initial Note (Signed)
 Transition of Care [name]) - Initial/Assessment Note    Patient Details  Name: [name] [name] MRN: [id number] Date of Birth: October 05, 1950  Transition of Care [name] [name]) CM/SW Contact:    [name] Jupiter, LCSW Phone Number: 12/06/2023, 12:20 PM  Clinical Narrative:                  Met with pt and spouse today to review anticipated dc needs for home IV abx set up.  Both aware and agreeable with this plan and note pt has had this after prior [name] discharges.  No agency preferences - referral placed with [facility] and liaison hopes to be here this afternoon to offer education.  Pt has all needed DME.  TOC will continue to follow.     Expected Discharge Plan: Home w Home Health Services Barriers to Discharge: No Barriers Identified   Patient Goals and CMS Choice Patient states their goals for this hospitalization and ongoing recovery are:: return home          Expected Discharge Plan and Services In-house Referral: Clinical Social Work     Living arrangements for the past 2 months: Single Family Home Expected Discharge Date: 12/08/23               DME Arranged: N/A DME Agency: NA       HH Arranged: IV Antibiotics HH Agency: [facility] Date HH Agency Contacted: 12/06/23 Time HH Agency Contacted: 1219 Representative spoke with at [facility] Agency: [name] [name].  Prior Living Arrangements/Services Living arrangements for the past 2 months: Single Family Home Lives with:: Spouse Patient language and need for interpreter reviewed:: Yes Do you feel safe going back to the place where you live?: Yes      Need for Family Participation in Patient Care: Yes (Comment) Care giver support system in place?: Yes (comment)   Criminal Activity/Legal Involvement Pertinent to Current Situation/Hospitalization: No - Comment as needed  Activities of Daily Living   ADL Screening (condition at time of admission) Independently performs ADLs?: Yes (appropriate for developmental age) Does the patient  have a NEW difficulty with bathing/dressing/toileting/self-feeding that is expected to last >3 days?: No Does the patient have a NEW difficulty with getting in/out of bed, walking, or climbing stairs that is expected to last >3 days?: Yes (Initiates electronic notice to provider for possible PT consult) (on going problem) Does the patient have a NEW difficulty with communication that is expected to last >3 days?: No Is the patient deaf or have difficulty hearing?: No Does the patient have difficulty seeing, even when wearing glasses/contacts?: No Does the patient have difficulty concentrating, remembering, or making decisions?: No  Permission Sought/Granted Permission sought to share information with : Family Supports Permission granted to share information with : Yes, Verbal Permission Granted  Share Information with NAME: wife, [name] [name] @ [phone]           Emotional Assessment Appearance:: Appears stated age Attitude/Demeanor/Rapport: Gracious Affect (typically observed): Accepting Orientation: : Oriented to Self, Oriented to Place, Oriented to  Time, Oriented to Situation Alcohol / Substance Use: Not Applicable Psych Involvement: No (comment)  Admission diagnosis:  Infection of prosthetic right knee joint (HCC) [T84.53XA] Patient Active Problem List   Diagnosis Date Noted   Postoperative seroma of musculoskeletal structure after musculoskeletal procedure 10/03/2023   Postoperative seroma of subcutaneous tissue after non-dermatologic procedure 10/03/2023   Blood loss anemia 08/16/2023   S/P revision of total knee, right 06/01/2023   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 AT [address] OF [address] [place] [address] Phone: [phone] Fax: [phone]     Social Drivers of Health (SDOH) Social History: SDOH Screenings   Food Insecurity: No Food Insecurity (12/05/2023)  Housing: Low Risk  (12/05/2023)  Transportation Needs: No Transportation Needs (12/05/2023)  Utilities: Not At Risk (12/05/2023)  Alcohol Screen: Low Risk  (06/26/2023)  Depression (PHQ2-9): Low Risk  (06/29/2023)  Financial Resource Strain: Low Risk  (06/26/2023)  Physical Activity: Inactive (06/26/2023)  Social Connections: Moderately Isolated (12/05/2023)  Stress: No Stress Concern Present (06/26/2023)  Tobacco Use: Low Risk  (12/05/2023)  Health Literacy: Adequate Health Literacy (06/29/2023)   SDOH Interventions:     Readmission Risk Interventions    02/22/2023    2:16 PM  Readmission Risk Prevention Plan  Post Dischage Appt Complete  Medication Screening Complete  Transportation Screening Complete

## 2023-12-06 NOTE — Progress Notes (Signed)
   12/06/23 1559  Spiritual Encounters  Type of Visit Initial  Care provided to: Patient  Reason for visit Advance directives  OnCall Visit No   Provided paperwork for HCPOA to patient, said brief prayer with patient as interdisciplinary team were also providing care. Will follow-up

## 2023-12-06 NOTE — Progress Notes (Signed)
 Patient ID: [name] [name], male   DOB: March 20, 1951, 73 y.o.   MRN: [id number] Subjective: 1 Day Post-Op Procedure(s) (LRB): IRRIGATION AND DEBRIDEMENT KNEE (Right)    Patient reports pain as mild. No events overnight Comfortable this morning  Objective:   VITALS:   Vitals:   12/06/23 0153 12/06/23 0517  BP: 125/66 (!) 140/77  Pulse: 63 63  Resp: 16 16  Temp: 98.3 F (36.8 C) 97.9 F (36.6 C)  SpO2: 96% 98%    Neurovascular intact Incision: dressing C/D/I - Prevana with adequate suction, no significant output as of yet  LABS Recent Labs    12/06/23 0324  HGB 10.9*  HCT 35.2*  WBC 5.8  PLT 204    Recent Labs    12/06/23 0324  NA 134*  K 4.0  BUN 19  CREATININE 1.03  GLUCOSE 179*    No results for input(s): "LABPT", "INR" in the last 72 hours.   Assessment/Plan: 1 Day Post-Op Procedure(s) (LRB): IRRIGATION AND DEBRIDEMENT KNEE (Right)   Advance diet Up with therapy Ordered PIC line I am intentionally trying to be as aggressive as possible with his knee based on procedure performed and hardware that has been placed Have ordered to start Ceftriaxone, will speak with ID to confirm my plan Likely home tomorrow if able to get all logistics worked out

## 2023-12-06 NOTE — Progress Notes (Signed)
 Peripherally Inserted Central Catheter Placement  The IV Nurse has discussed with the patient and/or persons authorized to consent for the patient, the purpose of this procedure and the potential benefits and risks involved with this procedure.  The benefits include less needle sticks, lab draws from the catheter, and the patient may be discharged home with the catheter. Risks include, but not limited to, infection, bleeding, blood clot (thrombus formation), and puncture of an artery; nerve damage and irregular heartbeat and possibility to perform a PICC exchange if needed/ordered by physician.  Alternatives to this procedure were also discussed.  Bard Power PICC patient education guide, fact sheet on infection prevention and patient information card has been provided to patient /or left at bedside.    PICC Placement Documentation  PICC Single Lumen 12/06/23 Right Brachial 40 cm 0 cm (Active)  Indication for Insertion or Continuance of Line Home intravenous therapies (PICC only) 12/06/23 1524  Exposed Catheter (cm) 0 cm 12/06/23 1524  Site Assessment Clean, Dry, Intact 12/06/23 1524  Line Status Flushed;Blood return noted;Saline locked 12/06/23 1524  Dressing Type Transparent;Securing device 12/06/23 1524  Dressing Status Antimicrobial disc/dressing in place;Clean, Dry, Intact 12/06/23 1524  Line Adjustment (NICU/IV Team Only) No 12/06/23 1524  Dressing Change Due 12/13/23 12/06/23 1524       Romie Jumper 12/06/2023, 3:27 PM

## 2023-12-07 ENCOUNTER — Other Ambulatory Visit (HOSPITAL_COMMUNITY): Payer: Self-pay

## 2023-12-07 DIAGNOSIS — T8453XA Infection and inflammatory reaction due to internal right knee prosthesis, initial encounter: Secondary | ICD-10-CM | POA: Diagnosis not present

## 2023-12-07 DIAGNOSIS — I10 Essential (primary) hypertension: Secondary | ICD-10-CM | POA: Diagnosis not present

## 2023-12-07 DIAGNOSIS — Z85828 Personal history of other malignant neoplasm of skin: Secondary | ICD-10-CM | POA: Diagnosis not present

## 2023-12-07 DIAGNOSIS — Z96651 Presence of right artificial knee joint: Secondary | ICD-10-CM | POA: Diagnosis not present

## 2023-12-07 DIAGNOSIS — Z79899 Other long term (current) drug therapy: Secondary | ICD-10-CM | POA: Diagnosis not present

## 2023-12-07 LAB — CBC
HCT: 35.4 % — ABNORMAL LOW (ref 39.0–52.0)
Hemoglobin: 10.9 g/dL — ABNORMAL LOW (ref 13.0–17.0)
MCH: 27.8 pg (ref 26.0–34.0)
MCHC: 30.8 g/dL (ref 30.0–36.0)
MCV: 90.3 fL (ref 80.0–100.0)
Platelets: 226 10*3/uL (ref 150–400)
RBC: 3.92 MIL/uL — ABNORMAL LOW (ref 4.22–5.81)
RDW: 15.8 % — ABNORMAL HIGH (ref 11.5–15.5)
WBC: 9.3 10*3/uL (ref 4.0–10.5)
nRBC: 0 % (ref 0.0–0.2)

## 2023-12-07 MED ORDER — SODIUM CHLORIDE 0.9 % IV SOLN: 2.0000 g | Freq: Every day | INTRAVENOUS | 0 refills | Status: DC

## 2023-12-07 MED ORDER — HEPARIN SOD (PORK) LOCK FLUSH 100 UNIT/ML IV SOLN
250.0000 [IU] | INTRAVENOUS | Status: AC | PRN
  Administered 2023-12-07: 250 [IU]

## 2023-12-07 MED ORDER — DAPTOMYCIN-SODIUM CHLORIDE 700-0.9 MG/100ML-% IV SOLN
700.0000 mg | Freq: Every day | INTRAVENOUS | 0 refills | Status: DC
  Filled 2023-12-07: qty 42, 1d supply, fill #0

## 2023-12-07 MED ORDER — DAPTOMYCIN IV (FOR PTA / DISCHARGE USE ONLY)
700.0000 mg | INTRAVENOUS | 0 refills | Status: DC
Start: 2023-12-07 — End: 2024-01-16

## 2023-12-07 MED ORDER — ACETAMINOPHEN 500 MG PO TABS
1000.0000 mg | ORAL_TABLET | Freq: Four times a day (QID) | ORAL | 0 refills | Status: DC
  Filled 2023-12-07: qty 30, 4d supply, fill #0

## 2023-12-07 MED ORDER — CEFTRIAXONE IV (FOR PTA / DISCHARGE USE ONLY)
2.0000 g | INTRAVENOUS | 0 refills | Status: DC
Start: 2023-12-07 — End: 2023-12-21

## 2023-12-07 MED ORDER — ASPIRIN 81 MG PO CHEW
81.0000 mg | CHEWABLE_TABLET | Freq: Two times a day (BID) | ORAL | 0 refills | Status: AC
  Filled 2023-12-07: qty 60, 30d supply, fill #0

## 2023-12-07 MED ORDER — METRONIDAZOLE 500 MG PO TABS
500.0000 mg | ORAL_TABLET | Freq: Two times a day (BID) | ORAL | 0 refills | Status: DC
  Filled 2023-12-07 (×2): qty 84, 42d supply, fill #0

## 2023-12-07 MED ORDER — METRONIDAZOLE 500 MG PO TABS
500.0000 mg | ORAL_TABLET | Freq: Two times a day (BID) | ORAL | 0 refills | Status: DC
Start: 2023-12-07 — End: 2023-12-21

## 2023-12-07 NOTE — TOC Transition Note (Signed)
 Transition of Care [facility]) - Discharge Note   Patient Details  Name: [name] [name] MRN: [id number] Date of Birth: 02-20-1951  Transition [facility]) CM/SW Contact:  Amada Jupiter, LCSW Phone Number: 12/07/2023, 11:21 AM   Clinical Narrative:     Pt medically ready for dc this afternoon.  [facility] to provide home IV abx coverage. Per pt agency request, have secured HHRN with [facility].  No further TOC needs.   Final next level of care: Home w Home Health Services Barriers to Discharge: No Barriers Identified   Patient Goals and CMS Choice Patient states their goals for this hospitalization and ongoing recovery are:: return home          Discharge Placement                       Discharge Plan and Services Additional resources added to the After Visit Summary for   In-house Referral: Clinical Social Work              DME Arranged: N/A DME Agency: NA       HH Arranged: IV Antibiotics, RN HH Agency: [facility] Date [facility] Agency Contacted: 12/06/23 Time HH Agency Contacted: 1219 Representative spoke with at [facility] Agency: Marciano Sequin and Lorenza Chick - Frances Furbish  Social Drivers of Health (SDOH) Interventions SDOH Screenings   Food Insecurity: No Food Insecurity (12/05/2023)  Housing: Low Risk  (12/05/2023)  Transportation Needs: No Transportation Needs (12/05/2023)  Utilities: Not At Risk (12/05/2023)  Alcohol Screen: Low Risk  (06/26/2023)  Depression (PHQ2-9): Low Risk  (06/29/2023)  Financial Resource Strain: Low Risk  (06/26/2023)  Physical Activity: Inactive (06/26/2023)  Social Connections: Moderately Isolated (12/05/2023)  Stress: No Stress Concern Present (06/26/2023)  Tobacco Use: Low Risk  (12/05/2023)  Health Literacy: Adequate Health Literacy (06/29/2023)     Readmission Risk Interventions    02/22/2023    2:16 PM  Readmission Risk Prevention Plan  Post Dischage Appt Complete  Medication Screening Complete  Transportation  Screening Complete

## 2023-12-07 NOTE — Progress Notes (Signed)
 TOC meds to bed delivered from outpatient [facility] pharmacy by this RN

## 2023-12-07 NOTE — Plan of Care (Signed)

## 2023-12-07 NOTE — Progress Notes (Signed)
 Patient ID: [name], male   DOB: [birth year], 73 y.o.   MRN: [id number] Subjective: 2 Days Post-Op Procedure(s) (LRB): IRRIGATION AND DEBRIDEMENT KNEE (Right)    Patient reports pain as mild. Has noticed a significant reduction in his RLE edema over night No drainage from Prevana at this point and no significant knee swelling I appreciate ID consult and recommendations to help aggressively work to salvage his revised knee  Objective:   VITALS:   Vitals:   12/06/23 2137 12/07/23 0636  BP: (!) 147/71 (!) 140/76  Pulse: 60 (!) 58  Resp: 18 18  Temp: 98.3 F (36.8 C) 97.6 F (36.4 C)  SpO2: 99% 100%    Neurovascular intact Incision: dressing C/D/I No significant prepatellar swelling No erythema Decreased RLE edema with skin wrinkling for the first time in a while  LABS Recent Labs    12/06/23 0324 12/07/23 0339  HGB 10.9* 10.9*  HCT 35.2* 35.4*  WBC 5.8 9.3  PLT 204 226    Recent Labs    12/06/23 0324  NA 134*  K 4.0  BUN 19  CREATININE 1.03  GLUCOSE 179*    No results for input(s): "LABPT", "INR" in the last 72 hours.   Assessment/Plan: 2 Days Post-Op Procedure(s) (LRB): IRRIGATION AND DEBRIDEMENT KNEE (Right)   Up with therapy Home today with PIC Change to home prevana suction RTC Monday the 10th

## 2023-12-11 DIAGNOSIS — T8453XA Infection and inflammatory reaction due to internal right knee prosthesis, initial encounter: Secondary | ICD-10-CM | POA: Diagnosis not present

## 2023-12-11 DIAGNOSIS — I10 Essential (primary) hypertension: Secondary | ICD-10-CM | POA: Diagnosis not present

## 2023-12-11 DIAGNOSIS — M96842 Postprocedural seroma of a musculoskeletal structure following a musculoskeletal system procedure: Secondary | ICD-10-CM | POA: Diagnosis not present

## 2023-12-11 DIAGNOSIS — B9561 Methicillin susceptible Staphylococcus aureus infection as the cause of diseases classified elsewhere: Secondary | ICD-10-CM | POA: Diagnosis not present

## 2023-12-18 ENCOUNTER — Other Ambulatory Visit: Payer: Self-pay

## 2023-12-18 ENCOUNTER — Telehealth: Payer: Self-pay

## 2023-12-18 ENCOUNTER — Emergency Department (HOSPITAL_COMMUNITY)
Admission: EM | Admit: 2023-12-18 | Discharge: 2023-12-18 | Disposition: A | Discharge status: Home / Self Care | Attending: Emergency Medicine | Admitting: Emergency Medicine

## 2023-12-18 DIAGNOSIS — R197 Diarrhea, unspecified: Secondary | ICD-10-CM | POA: Diagnosis not present

## 2023-12-18 DIAGNOSIS — E860 Dehydration: Secondary | ICD-10-CM | POA: Insufficient documentation

## 2023-12-18 DIAGNOSIS — Z7982 Long term (current) use of aspirin: Secondary | ICD-10-CM | POA: Insufficient documentation

## 2023-12-18 DIAGNOSIS — Z452 Encounter for adjustment and management of vascular access device: Secondary | ICD-10-CM | POA: Insufficient documentation

## 2023-12-18 LAB — COMPREHENSIVE METABOLIC PANEL
ALT: 40 U/L (ref 0–44)
AST: 46 U/L — ABNORMAL HIGH (ref 15–41)
Albumin: 3.3 g/dL — ABNORMAL LOW (ref 3.5–5.0)
Alkaline Phosphatase: 54 U/L (ref 38–126)
Anion gap: 8 (ref 5–15)
BUN: 19 mg/dL (ref 8–23)
CO2: 21 mmol/L — ABNORMAL LOW (ref 22–32)
Calcium: 8.3 mg/dL — ABNORMAL LOW (ref 8.9–10.3)
Chloride: 101 mmol/L (ref 98–111)
Creatinine, Ser: 0.85 mg/dL (ref 0.61–1.24)
GFR, Estimated: >60 mL/min (ref >60)
Glucose, Bld: 127 mg/dL — ABNORMAL HIGH (ref 70–99)
Potassium: 3.6 mmol/L (ref 3.5–5.1)
Sodium: 130 mmol/L — ABNORMAL LOW (ref 135–145)
Total Bilirubin: 0.7 mg/dL (ref 0.0–1.2)
Total Protein: 5.9 g/dL — ABNORMAL LOW (ref 6.5–8.1)

## 2023-12-18 LAB — URINALYSIS, ROUTINE W REFLEX MICROSCOPIC
Bilirubin Urine: NEGATIVE
Glucose, UA: NEGATIVE mg/dL
Hgb urine dipstick: NEGATIVE
Ketones, ur: NEGATIVE mg/dL
Leukocytes,Ua: NEGATIVE
Nitrite: NEGATIVE
Protein, ur: NEGATIVE mg/dL
Specific Gravity, Urine: 1.02 (ref 1.005–1.030)
pH: 6 (ref 5.0–8.0)

## 2023-12-18 LAB — CBC
HCT: 37.2 % — ABNORMAL LOW (ref 39.0–52.0)
Hemoglobin: 12.1 g/dL — ABNORMAL LOW (ref 13.0–17.0)
MCH: 27.5 pg (ref 26.0–34.0)
MCHC: 32.5 g/dL (ref 30.0–36.0)
MCV: 84.5 fL (ref 80.0–100.0)
Platelets: 129 10*3/uL — ABNORMAL LOW (ref 150–400)
RBC: 4.4 MIL/uL (ref 4.22–5.81)
RDW: 16.1 % — ABNORMAL HIGH (ref 11.5–15.5)
WBC: 2.9 10*3/uL — ABNORMAL LOW (ref 4.0–10.5)
nRBC: 0 % (ref 0.0–0.2)

## 2023-12-18 LAB — RESP PANEL BY RT-PCR (RSV, FLU A&B, COVID)  RVPGX2
Influenza A by PCR: NEGATIVE
Influenza B by PCR: NEGATIVE
Resp Syncytial Virus by PCR: NEGATIVE
SARS Coronavirus 2 by RT PCR: NEGATIVE

## 2023-12-18 LAB — LIPASE, BLOOD: Lipase: 42 U/L (ref 11–51)

## 2023-12-18 LAB — LACTIC ACID, PLASMA: Lactic Acid, Venous: 1 mmol/L (ref 0.5–1.9)

## 2023-12-18 MED ORDER — SODIUM CHLORIDE 0.9 % IV BOLUS
1000.0000 mL | Freq: Once | INTRAVENOUS | Status: AC
  Administered 2023-12-18: 1000 mL via INTRAVENOUS

## 2023-12-18 NOTE — Telephone Encounter (Signed)
 Sounds like he needs a visit

## 2023-12-18 NOTE — Discharge Instructions (Signed)
 You were seen in the Emergency Department given concern for potential infection The blood work did show a low white blood cell count Your platelets were also low The rest of your blood work looked okay The blood cultures are still pending and you will need to follow-up on the results of these to see if there is an infection in the bloodstream If there is an infection in the bloodstream you will need to come into the hospital for more antibiotics Otherwise continue taking previous prescribed antibiotics via your PICC line and follow-up with your specialist this week Return to the emergency department for severe fevers, uncontrolled diarrhea or concerns of dehydration

## 2023-12-18 NOTE — Telephone Encounter (Signed)
 Copied from CRM [id number]. Topic: Clinical - Request for Lab/Test Order >> Dec 18, 2023  9:36 AM Elizebeth Brooking wrote: Reason for CRM: Patient wife called in stated patient has orange urine, would like to get a urinalysis on the patient, is wanting someone to give them a callback regarding this concern

## 2023-12-18 NOTE — ED Provider Notes (Signed)
 [place] EMERGENCY DEPARTMENT AT [facility] Provider Note   CSN: [id number] Arrival date & time: 12/18/23  1233     History  Chief Complaint  Patient presents with   Diarrhea   Fatigue   Abdominal Pain    [name] MEIRING is a 73 y.o. male.  With a history of status post right TKR with recurrent hardware infections, and recent right upper extremity PICC line placement who presents to the ED given concern for infection.  Patient has experienced recurrent infections of the right prosthetic knee and is followed by orthopedics (Dr. Charlann Boxer).  His right upper extremity PICC line was placed on March 6 he presents today voicing concern for infection at the PICC line site with increased redness and discomfort.  He also notes generalized weakness, decreased urinary output and diarrhea over the last several days as well.  Does have a history of allergy to chlorhexidine and his nursing team is made sure there is no chlorhexidine on the new dressings.  As for the right knee, there is no increased redness warmth or pain over this region.  Has had some chills but no fever.  No chest pain, shortness of breath abdominal pain or vomiting.   Diarrhea Associated symptoms: abdominal pain   Abdominal Pain Associated symptoms: diarrhea        Home Medications Prior to Admission medications   Medication Sig Start Date End Date Taking? Authorizing Provider  acetaminophen (TYLENOL) 500 MG tablet Take 2 tablets (1,000 mg total) by mouth every 6 (six) hours. 12/07/23   Durene Romans, MD  aspirin 81 MG chewable tablet Chew 1 tablet (81 mg total) by mouth 2 (two) times daily. 12/07/23 01/06/24  Durene Romans, MD  benzonatate (TESSALON) 200 MG capsule Take 1 capsule (200 mg total) by mouth 3 (three) times daily as needed for cough. 11/07/23   Piontek, Denny Peon, MD  calcium carbonate (TUMS - DOSED IN MG ELEMENTAL CALCIUM) 500 MG chewable tablet Chew 2 tablets by mouth 2 (two) times daily as needed for indigestion  or heartburn.    [provider]  cefTRIAXone (ROCEPHIN) IVPB Inject 2 g into the vein daily. Indication: recurrent R-knee PJI First Dose: Yes Last Day of Therapy:  01/17/24 Labs - Once weekly:  CBC/D and BMP, Labs - Once weekly: ESR and CRP Method of administration: IV Push Method of administration may be changed at the discretion of home infusion pharmacist based upon assessment of the patient and/or caregiver's ability to self-administer the medication ordered. 12/07/23 01/18/24  Durene Romans, MD  daptomycin (CUBICIN) IVPB Inject 700 mg into the vein daily. Indication:  R-knee PJI First Dose: Yes Last Day of Therapy:  01/17/24 Labs - Once weekly:  CBC/D, BMP, and CPK Labs - Once weekly: ESR and CRP Method of administration: IV Push Method of administration may be changed at the discretion of home infusion pharmacist based upon assessment of the patient and/or caregiver's ability to self-administer the medication ordered. 12/07/23 01/18/24  Durene Romans, MD  furosemide (LASIX) 20 MG tablet Take 1 tablet (20 mg total) by mouth daily as needed for fluid or edema. 11/15/23 11/14/24  Cassandria Anger, PA-C  gabapentin (NEURONTIN) 300 MG capsule Take 300 mg by mouth 2 (two) times daily.    [provider]  Ketotifen Fumarate (ALLERGY EYE DROPS OP) Place 1 drop into both eyes daily as needed (red/itchy eyes).    [provider]  methocarbamol (ROBAXIN) 500 MG tablet Take 1 tablet (500 mg total) by  mouth every 6 (six) hours as needed for muscle spasms. 11/15/23   Cassandria Anger, PA-C  metroNIDAZOLE (FLAGYL) 500 MG tablet Take 1 tablet (500 mg total) by mouth 2 (two) times daily. 12/07/23 01/18/24  Durene Romans, MD  metroNIDAZOLE (FLAGYL) 500 MG tablet Take 1 tablet (500 mg total) by mouth every 12 (twelve) hours. 12/07/23   Durene Romans, MD  naproxen (NAPROSYN) 500 MG tablet Take 500 mg by mouth daily.    [provider]  oxyCODONE (OXY IR/ROXICODONE) 5 MG immediate  release tablet Take 1 tablet (5 mg total) by mouth every 4 (four) hours as needed for severe pain (pain score 7-10). 11/15/23   Cassandria Anger, PA-C  polyethylene glycol (MIRALAX / GLYCOLAX) 17 g packet Take 17 g by mouth 2 (two) times daily. Patient taking differently: Take 17 g by mouth daily as needed for mild constipation or moderate constipation. 02/23/23   Cassandria Anger, PA-C  saccharomyces boulardii (FLORASTOR) 250 MG capsule Take 250 mg by mouth daily.    [provider]  sertraline (ZOLOFT) 100 MG tablet Take 1 tablet (100 mg total) by mouth daily. 08/15/23   Myrlene Broker, MD  sodium chloride (OCEAN) 0.65 % SOLN nasal spray Place 1 spray into both nostrils daily as needed for congestion.    [provider]  tamsulosin (FLOMAX) 0.4 MG CAPS capsule Take 0.4 mg by mouth at bedtime. 12/04/14   [provider]  telmisartan (MICARDIS) 80 MG tablet Take 1 tablet (80 mg total) by mouth daily. 08/15/23   Myrlene Broker, MD      Allergies    Avapro [irbesartan], Chlorhexidine, and Lexapro [escitalopram oxalate]    Review of Systems   Review of Systems  Gastrointestinal:  Positive for abdominal pain and diarrhea.    Physical Exam Updated Vital Signs BP 129/74   Pulse 62   Temp 97.7 F (36.5 C) (Oral)   Resp 18   Wt 103.4 kg   SpO2 97%   BMI 33.67 kg/m  Physical Exam Vitals and nursing note reviewed.  HENT:     Head: Normocephalic and atraumatic.  Eyes:     Pupils: Pupils are equal, round, and reactive to light.  Cardiovascular:     Rate and Rhythm: Normal rate and regular rhythm.  Pulmonary:     Effort: Pulmonary effort is normal.     Breath sounds: Normal breath sounds.  Abdominal:     Palpations: Abdomen is soft.     Tenderness: There is no abdominal tenderness.  Skin:    General: Skin is warm and dry.     Comments: Right upper arm PICC line in place with surrounding erythema No increased warmth Healing surgical incision  with overlying sutures over right anterior knee with no erythema, fluctuance or increased warmth  Neurological:     Mental Status: He is alert.  Psychiatric:        Mood and Affect: Mood normal.        ED Results / Procedures / Treatments   Labs (all labs ordered are listed, but only abnormal results are displayed) Labs Reviewed  COMPREHENSIVE METABOLIC PANEL - Abnormal; Notable for the following components:      Result Value   Sodium 130 (*)    CO2 21 (*)    Glucose, Bld 127 (*)    Calcium 8.3 (*)    Total Protein 5.9 (*)    Albumin 3.3 (*)    AST 46 (*)    All  other components within normal limits  CBC - Abnormal; Notable for the following components:   WBC 2.9 (*)    Hemoglobin 12.1 (*)    HCT 37.2 (*)    RDW 16.1 (*)    Platelets 129 (*)    All other components within normal limits  RESP PANEL BY RT-PCR (RSV, FLU A&B, COVID)  [id number]  CULTURE, BLOOD (ROUTINE X 2)  CULTURE, BLOOD (ROUTINE X 2)  LIPASE, BLOOD  URINALYSIS, ROUTINE W REFLEX MICROSCOPIC  LACTIC ACID, PLASMA  LACTIC ACID, PLASMA  CBC WITH DIFFERENTIAL/PLATELET    EKG None  Radiology No results found.  Procedures Procedures    Medications Ordered in ED Medications  sodium chloride 0.9 % bolus 1,000 mL (0 mLs Intravenous Stopped 12/18/23 2046)    ED Course/ Medical Decision Making/ A&P Clinical Course as of 12/18/23 2230  Mon Dec 18, 2023  2227 Laboratory workup notable for leukopenia of 2.9 along with relative thrombocytopenia.  UA negative.  COVID influenza RSV all negative.  No elevation in venous lactate.  Patient reports feeling much better after 1 L of IV fluids.  We discussed the rationale for admission to await results of blood cultures versus discharge home with close outpatient follow-up.  He knows he must return to the hospital should the blood cultures show positive growth.  He has upcoming appointments with orthopedics and infectious disease later this week.  At this time he wishes  to go home and will follow-up on the results of his blood cultures.  I do think that is a reasonable plan given that he is capable of following up and lives very close by. [MP]    Clinical Course User Index [MP] Royanne Foots, DO                                 Medical Decision Making 73 year old male with history as above presenting given concern for infection from recently placed right upper extremity PICC line site.  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  He is currently on Flagyl, daptomycin and ceftriaxone.  Initial vital signs notable for hypotension with systolic blood pressure in the 80s.  This may be due to dehydration in the setting of diarrhea and volume loss but he has multiple potential sources for infection.  Will obtain infectious workup including labs, lactate, cultures provide IV fluids for rehydration.  He is followed by orthopedics (Dr. Charlann Boxer) and [facility] infectious disease  Amount and/or Complexity of Data Reviewed Labs: ordered.           Final Clinical Impression(s) / ED Diagnoses Final diagnoses:  Dehydration  Diarrhea, unspecified type  PICC (peripherally inserted central catheter) in place    Rx / DC Orders ED Discharge Orders     None         Royanne Foots, DO 12/18/23 2231

## 2023-12-18 NOTE — Telephone Encounter (Signed)
**Note De-identified  [place] Obfuscation** Please advise 

## 2023-12-18 NOTE — ED Triage Notes (Signed)
 Pt arrived via POV. C/o diarrhea, fatigue, and has concern about their Picc line dressing- which has CHG- allergy.  AOx4

## 2023-12-20 DIAGNOSIS — M96842 Postprocedural seroma of a musculoskeletal structure following a musculoskeletal system procedure: Secondary | ICD-10-CM | POA: Diagnosis not present

## 2023-12-20 DIAGNOSIS — B9561 Methicillin susceptible Staphylococcus aureus infection as the cause of diseases classified elsewhere: Secondary | ICD-10-CM | POA: Diagnosis not present

## 2023-12-20 DIAGNOSIS — I10 Essential (primary) hypertension: Secondary | ICD-10-CM | POA: Diagnosis not present

## 2023-12-20 DIAGNOSIS — T8453XA Infection and inflammatory reaction due to internal right knee prosthesis, initial encounter: Secondary | ICD-10-CM | POA: Diagnosis not present

## 2023-12-21 ENCOUNTER — Telehealth: Payer: Self-pay

## 2023-12-21 ENCOUNTER — Encounter: Payer: Self-pay | Admitting: Infectious Diseases

## 2023-12-21 ENCOUNTER — Ambulatory Visit: Admitting: Infectious Diseases

## 2023-12-21 ENCOUNTER — Other Ambulatory Visit: Payer: Self-pay

## 2023-12-21 VITALS — BP 130/76 | HR 64 | Wt 226.4 lb

## 2023-12-21 DIAGNOSIS — T8453XD Infection and inflammatory reaction due to internal right knee prosthesis, subsequent encounter: Secondary | ICD-10-CM | POA: Diagnosis not present

## 2023-12-21 DIAGNOSIS — Z79899 Other long term (current) drug therapy: Secondary | ICD-10-CM | POA: Insufficient documentation

## 2023-12-21 DIAGNOSIS — K521 Toxic gastroenteritis and colitis: Secondary | ICD-10-CM | POA: Insufficient documentation

## 2023-12-21 DIAGNOSIS — Z452 Encounter for adjustment and management of vascular access device: Secondary | ICD-10-CM | POA: Insufficient documentation

## 2023-12-21 MED ORDER — DOXYCYCLINE HYCLATE 100 MG PO TABS: 100.0000 mg | ORAL_TABLET | Freq: Two times a day (BID) | ORAL | 1 refills | Status: DC

## 2023-12-21 MED ORDER — AMOXICILLIN-POT CLAVULANATE 875-125 MG PO TABS: 1.0000 | ORAL_TABLET | Freq: Two times a day (BID) | ORAL | 1 refills | Status: DC

## 2023-12-21 NOTE — Progress Notes (Addendum)
 Patient Active Problem List   Diagnosis Date Noted   Postoperative seroma of musculoskeletal structure after musculoskeletal procedure 10/03/2023   Postoperative seroma of subcutaneous tissue after non-dermatologic procedure 10/03/2023   Blood loss anemia 08/16/2023   S/P revision of total knee, right 06/01/2023   Infection of prosthetic right knee joint (HCC) 02/21/2023   Infection of right knee (HCC) 11/14/2022   Chronic sinusitis 09/02/2022   Encounter for general adult medical examination with abnormal findings 07/09/2021   BPH (benign prostatic hyperplasia) 07/09/2021   MDD (major depressive disorder), recurrent, in full remission (HCC) 07/09/2021   Essential hypertension 10/20/2008    Current Outpatient Medications on File Prior to Visit  Medication Sig Dispense Refill   aspirin 81 MG chewable tablet Chew 1 tablet (81 mg total) by mouth 2 (two) times daily. 60 tablet 0   calcium carbonate (TUMS - DOSED IN MG ELEMENTAL CALCIUM) 500 MG chewable tablet Chew 2 tablets by mouth 2 (two) times daily as needed for indigestion or heartburn.     daptomycin (CUBICIN) IVPB Inject 700 mg into the vein daily. Indication:  R-knee PJI First Dose: Yes Last Day of Therapy:  01/17/24 Labs - Once weekly:  CBC/D, BMP, and CPK Labs - Once weekly: ESR and CRP Method of administration: IV Push Method of administration may be changed at the discretion of home infusion pharmacist based upon assessment of the patient and/or caregiver's ability to self-administer the medication ordered. 42 Units 0   Ketotifen Fumarate (ALLERGY EYE DROPS OP) Place 1 drop into both eyes daily as needed (red/itchy eyes).     naproxen (NAPROSYN) 500 MG tablet Take 500 mg by mouth daily.     saccharomyces boulardii (FLORASTOR) 250 MG capsule Take 250 mg by mouth daily.     sertraline (ZOLOFT) 100 MG tablet Take 1 tablet (100 mg total) by mouth daily. 90 tablet 3   sodium chloride (OCEAN) 0.65 % SOLN nasal spray Place 1  spray into both nostrils daily as needed for congestion.     tamsulosin (FLOMAX) 0.4 MG CAPS capsule Take 0.4 mg by mouth at bedtime.  0   telmisartan (MICARDIS) 80 MG tablet Take 1 tablet (80 mg total) by mouth daily. 90 tablet 3   benzonatate (TESSALON) 200 MG capsule Take 1 capsule (200 mg total) by mouth 3 (three) times daily as needed for cough. 21 capsule 0   furosemide (LASIX) 20 MG tablet Take 1 tablet (20 mg total) by mouth daily as needed for fluid or edema. 14 tablet 0   gabapentin (NEURONTIN) 300 MG capsule Take 300 mg by mouth 2 (two) times daily.     methocarbamol (ROBAXIN) 500 MG tablet Take 1 tablet (500 mg total) by mouth every 6 (six) hours as needed for muscle spasms. 30 tablet 1   oxyCODONE (OXY IR/ROXICODONE) 5 MG immediate release tablet Take 1 tablet (5 mg total) by mouth every 4 (four) hours as needed for severe pain (pain score 7-10). 20 tablet 0   polyethylene glycol (MIRALAX / GLYCOLAX) 17 g packet Take 17 g by mouth 2 (two) times daily. (Patient taking differently: Take 17 g by mouth daily as needed for mild constipation or moderate constipation.) 14 each 0   No current facility-administered medications on file prior to visit.   Subjective: 73 year old male with PMH as below including HTN, bronchitis, GERD, skin cancer, arthritis, depression, right knee PJI with MSSA status post I&D on 11/15/2022 treated with 8 weeks IV cefazolin followed by  suppressive PO cefadroxil complicated by recurrent PJI requiring resection of right knee arthroplasty and spacer placement 02/21/23 ( OR Cx with finegoldia magna). He was initially started on Dapto and then transition to cefazolin, added metronidazole as cultures grew Finegoldia magna, EOT 04/04/23. He had knee reimplantation in 06/01/23 after which he did well for a couple of months but started having recurrent prepatellar swellings and seromas since October 2024, s/p multiple fluid aspirations by Dr Nilsa Nutting office with no cultures take.    He was admitted 12/31-1/2 for post op seroma and underwent I and D with wound vac application on 12/31 (no cx taken). He was last admitted 2/11-2/12 for post op seroma and underwent I and D with primary wound closure and prevena wound vac on 2/11 ( no cx taken).  Per chart he has been on cefadroxil since early January although wife thinks he may have been taking cefadroxil even before that.    3/5 S/p debridement with primary wound closure and placement of Prevena wound VAC.  No cx taken.  Patient was discharged on 3/16 with plan for 6 weeks of daptomycin, ceftriaxone and metronidazole, EOT 4/16 for presumptive culture negative rt knee PJI.   Interval events Patient was seen in the ED 3/17 with concerns for PICC line infection/generalized weakness, diarrhea, dark colored urine, soft BP. He was given IVF. PICC with some erythema but no other signs of infection like warmth or tenderness. He was discharged after IVF. Blood cx 2/2 sets negative. Antibiotics have been on hold since then  3/20 Accompanied by wife. Saw Dr Charlann Boxer Monday, sutures were removed since rt knee wound had healed. He reports Dr Charlann Boxer said him another surgery in his knee wound be morbid and would not be recommended and prefers him to be on abtx. He expressed reluctance to continue IV abtx given his symptoms leading to ED visit. He never felt so bad as he did. All of his symptoms have resolved after stopping abtx. RT knee wound has healed with no symptoms concerning for infection.   Review of Systems: Denies fevers, chills. Denies nausea, vomiting, diarrhea. Denies pain, tenderness or drainage from rt knee wound.   Past Medical History:  Diagnosis Date   Arthritis    Bronchitis    Depression    GERD (gastroesophageal reflux disease)    Hypertension    Skin cancer    Past Surgical History:  Procedure Laterality Date   APPLICATION OF WOUND VAC Right 10/03/2023   Procedure: APPLICATION OF WOUND VAC;  Surgeon: Durene Romans, MD;   Location: [facility];  Service: Orthopedics;  Laterality: Right;   CHOLECYSTECTOMY N/A 02/10/2021   Procedure: LAPAROSCOPIC CHOLECYSTECTOMY;  Surgeon: Harriette Bouillon, MD;  Location: [facility];  Service: General;  Laterality: N/A;   EXCISIONAL TOTAL KNEE ARTHROPLASTY WITH ANTIBIOTIC SPACERS Right 02/21/2023   Procedure: EXCISIONAL TOTAL KNEE ARTHROPLASTY WITH ANTIBIOTIC SPACERS;  Surgeon: Durene Romans, MD;  Location: [facility];  Service: Orthopedics;  Laterality: Right;   HERNIA REPAIR     I & D KNEE WITH POLY EXCHANGE Right 11/15/2022   Procedure: IRRIGATION AND DEBRIDEMENT KNEE WITH  POLY EXCHANGE;  Surgeon: Eugenia Mcalpine, MD;  Location: [facility];  Service: Orthopedics;  Laterality: Right;  adductor canal, no antibiotics prior to culture add on room to follow other case 61   IRRIGATION AND DEBRIDEMENT KNEE Right 10/03/2023   Procedure: IRRIGATION AND DEBRIDEMENT KNEE;  Surgeon: Durene Romans, MD;  Location: [facility];  Service: Orthopedics;  Laterality: Right;  1 HR  IRRIGATION AND DEBRIDEMENT KNEE Right 11/14/2023   Procedure: RIGHT KNEE IRRIGATION AND DEBRIDEMENT AND WOUND CLOSURE;  Surgeon: Durene Romans, MD;  Location: [facility];  Service: Orthopedics;  Laterality: Right;   IRRIGATION AND DEBRIDEMENT KNEE Right 12/05/2023   Procedure: IRRIGATION AND DEBRIDEMENT KNEE;  Surgeon: Durene Romans, MD;  Location: [facility];  Service: Orthopedics;  Laterality: Right;  1 HR   Knee Surgery Right 10/25/2022   REIMPLANTATION OF TOTAL KNEE Right 06/01/2023   Procedure: REIMPLANTATION/REVISION OF TOTAL KNEE;  Surgeon: Durene Romans, MD;  Location: [facility];  Service: Orthopedics;  Laterality: Right;   ROTATOR CUFF REPAIR     SKIN CANCER EXCISION      Social History   Tobacco Use   Smoking status: Never   Smokeless tobacco: Never  Vaping Use   Vaping status: Never Used  Substance Use Topics   Alcohol use: Yes    Comment: socially   Drug use: No    Family History  Problem Relation Age of Onset   Emphysema Mother         heart failure indusce emphysema   Heart disease Father    Heart attack Father    Heart disease Sister 66       [id number]    Allergies  Allergen Reactions   Avapro [Irbesartan] Cough   Chlorhexidine Rash    Reaction to PICC line dressing with Chlorhexidine gel Not allergic to Chlorhexidine soap   Lexapro [Escitalopram Oxalate] Anxiety    insomnia    Health Maintenance  Topic Date Due   Colonoscopy  Never done   Zoster Vaccines- Shingrix (1 of 2) 04/22/2002   COVID-19 Vaccine (6 - 2024-25 season) 06/04/2023   Medicare Annual Wellness (AWV)  06/28/2024   DTaP/Tdap/Td (4 - Td or Tdap) 04/02/2029   Pneumonia Vaccine 65+ Years old  Completed   INFLUENZA VACCINE  Completed   Hepatitis C Screening  Completed   HPV VACCINES  Aged Out    Objective: BP 130/76   Pulse 64   Wt 226 lb 6.4 oz (102.7 kg)   SpO2 98%   BMI 33.43 kg/m  '  Physical Exam Constitutional:      Appearance: Normal appearance.  HENT:     Head: Normocephalic and atraumatic.      Mouth: Mucous membranes are moist.  Eyes:    Conjunctiva/sclera: Conjunctivae normal.     Pupils: Pupils are equal, round, and b/l symmetrical   Cardiovascular:     Rate and Rhythm: Normal rate and regular rhythm.     Heart sounds:  Pulmonary:     Effort: Pulmonary effort is normal.     Breath sounds:   Abdominal:     General: Non distended     Palpations: soft.   Musculoskeletal:        General: Normal range of motion. Ambulatory. Rt knee wound has healed with no signs of infection  Skin:    General: Skin is warm and dry.     Comments: Rt arm picc with no redness, swelling or warmth or tenderness   Neurological:     General: grossly non focal     Mental Status: awake, alert and oriented to person, place, and time.   Psychiatric:        Mood and Affect: Mood normal.   Lab Results Lab Results  Component Value Date   WBC 2.9 (L) 12/18/2023   HGB 12.1 (L) 12/18/2023   HCT 37.2 (L) 12/18/2023   MCV 84.5  12/18/2023  PLT 129 (L) 12/18/2023    Lab Results  Component Value Date   CREATININE 0.85 12/18/2023   BUN 19 12/18/2023   NA 130 (L) 12/18/2023   K 3.6 12/18/2023   CL 101 12/18/2023   CO2 21 (L) 12/18/2023    Lab Results  Component Value Date   ALT 40 12/18/2023   AST 46 (H) 12/18/2023   ALKPHOS 54 12/18/2023   BILITOT 0.7 12/18/2023    Lab Results  Component Value Date   CHOL 184 08/15/2023   HDL 72.30 08/15/2023   LDLCALC 100 (H) 08/15/2023   TRIG 54.0 08/15/2023   CHOLHDL 3 08/15/2023   No results found for: "LABRPR", "RPRTITER" No results found for: "HIV1RNAQUANT", "HIV1RNAVL", "CD4TABS"  Assessment/plan # Recurrent rt knee PJI  # Non healing surgical wound of rt knee  - prior h/o MSSA followed by finegoldia magna in 2024 s/p surgical intervention and antibiotics as above complicated by recurrent postop seroma as well as nonhealing surgical wound requiring multiple aspirations as well as debridements as abive without any cultures taken. - 3/20 clinically rt knee wound has healed   Plan  - discussed with patient that ideally PJI is treated with 6 weeks of IV abtx. However, given his severe diarrhea leading to ED visit, he does not think he can tolerate another 4 weeks course. He has completed 2 weeks of IV abtx on 3/17 and will start him on po doxycycline and augmentin for 4 more weeks. Will consider suppression thereafter given he seems to be a non surgical candidate. Will need to fu with Dr Charlann Boxer - Fu in 4 weeks   # Antibiotic associated diarrhea - resolved   # Medication Monitoring  - 3/17 labs from ED reviewed   # PICC  - no signs of infection  - will coordinate with [facility] for removal   I have personally spent 34 minutes involved in face-to-face and non-face-to-face activities for this patient on the day of the visit. Professional time spent includes the following activities: Preparing to see the patient (review of tests), Obtaining and/or reviewing separately  obtained history (admission/discharge record), Performing a medically appropriate examination and/or evaluation , Ordering medications/tests/procedures, referring and communicating with other health care professionals, Documenting clinical information in the EMR, Independently interpreting results (not separately reported), Communicating results to the patient/family/caregiver, Counseling and educating the patient/family/caregiver and Care coordination (not separately reported).   Of note, portions of this note may have been created with voice recognition software. While this note has been edited for accuracy, occasional wrong-word or 'sound-a-like' substitutions may have occurred due to the inherent limitations of voice recognition software.   Victoriano Lain, MD [facility] 12/21/2023, 10:44 AM

## 2023-12-21 NOTE — Discharge Summary (Signed)
 Patient ID: [name] [name] MRN: [id number] DOB/AGE: 72-Feb-1952 72 y.o.  Admit date: 12/05/2023 Discharge date: 12/07/2023  Admission Diagnoses:  Right knee prosthetic joint infection  Discharge Diagnoses:  Principal Problem:   Infection of prosthetic right knee joint [facility])   Past Medical History:  Diagnosis Date   Arthritis    Bronchitis    Depression    GERD (gastroesophageal reflux disease)    Hypertension    Skin cancer     Surgeries: Procedure(s): IRRIGATION AND DEBRIDEMENT KNEE on 12/05/2023   Consultants:   Discharged Condition: Improved  Hospital Course: [name] [name] is an 73 y.o. 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 Patient has severe unremitting pain that affects sleep, daily activities, and work/hobbies. After pre-op clearance the patient was taken to the operating room on 12/05/2023 and underwent  Procedure(s): IRRIGATION AND DEBRIDEMENT KNEE.    Patient was given perioperative antibiotics:  Anti-infectives (From admission, onward)    Start     Dose/Rate Route Frequency Ordered Stop   12/07/23 0000  cefTRIAXone (ROCEPHIN) IVPB        2 g Intravenous Every 24 hours 12/07/23 0923 01/18/24 2359   12/07/23 0000  daptomycin (CUBICIN) IVPB        700 mg Intravenous Every 24 hours 12/07/23 0923 01/18/24 2359   12/07/23 0000  metroNIDAZOLE (FLAGYL) 500 MG tablet        500 mg Oral 2 times daily 12/07/23 0923 01/18/24 2359   12/07/23 0000  cefTRIAXone 2 g in sodium chloride 0.9 % 100 mL  Status:  Discontinued        2 g Intravenous Daily 12/07/23 0923 12/07/23    12/07/23 0000  daptomycin (CUBICIN) 700-0.9 MG/100ML-% SOLN  Status:  Discontinued        700 mg Intravenous Daily 12/07/23 0923 12/07/23    12/07/23 0000  metroNIDAZOLE (FLAGYL) 500 MG tablet        500 mg Oral Every 12 hours 12/07/23 0923     12/06/23 1015  DAPTOmycin (CUBICIN) IVPB 700 mg/141mL premix  Status:  Discontinued        8  mg/kg  83.8 kg (Adjusted) 200 mL/hr over 30 Minutes Intravenous Daily 12/06/23 0924 12/07/23 2026   12/06/23 1015  metroNIDAZOLE (FLAGYL) tablet 500 mg  Status:  Discontinued        500 mg Oral Every 12 hours 12/06/23 0924 12/07/23 2026   12/06/23 1000  cefTRIAXone (ROCEPHIN) 2 g in sodium chloride 0.9 % 100 mL IVPB  Status:  Discontinued        2 g 200 mL/hr over 30 Minutes Intravenous Daily 12/06/23 0924 12/07/23 2026   12/05/23 2100  ceFAZolin (ANCEF) IVPB 2g/100 mL premix        2 g 200 mL/hr over 30 Minutes Intravenous Every 6 hours 12/05/23 1840 12/06/23 0255   12/05/23 1700  cefTRIAXone (ROCEPHIN) 2 g in sodium chloride 0.9 % 100 mL IVPB  Status:  Discontinued        2 g 200 mL/hr over 30 Minutes Intravenous Every 24 hours 12/05/23 1638 12/06/23 0924   12/05/23 1545  vancomycin (VANCOCIN) powder  Status:  Discontinued          As needed 12/05/23 1558 12/05/23 1835   12/05/23 1300  ceFAZolin (ANCEF) IVPB 2g/100 mL premix        2 g 200 mL/hr over 30 Minutes Intravenous On call to O.R. 12/05/23 1254 12/05/23 1510  Patient was given sequential compression devices, early ambulation, and chemoprophylaxis to prevent DVT. Patient worked with PT and was meeting their goals regarding safe ambulation and transfers.  Patient benefited maximally from hospital stay and there were no complications.    Recent vital signs: No data found.   Recent laboratory studies:  Recent Labs    12/18/23 1258  WBC 2.9*  HGB 12.1*  HCT 37.2*  PLT 129*  NA 130*  K 3.6  CL 101  CO2 21*  BUN 19  CREATININE 0.85  GLUCOSE 127*  CALCIUM 8.3*     Discharge Medications:   Allergies as of 12/07/2023       Reactions   Avapro [irbesartan] Cough   Chlorhexidine Rash   Reaction to PICC line dressing with Chlorhexidine gel Not allergic to Chlorhexidine soap   Lexapro [escitalopram Oxalate] Anxiety   insomnia        Medication List     STOP taking these medications    cefadroxil 500  MG capsule Commonly known as: DURICEF       TAKE these medications    Acetaminophen Extra Strength 500 MG Tabs Take 2 tablets (1,000 mg total) by mouth every 6 (six) hours.   ALLERGY EYE DROPS OP Place 1 drop into both eyes daily as needed (red/itchy eyes).   Aspirin Low Dose 81 MG chewable tablet Generic drug: aspirin Chew 1 tablet (81 mg total) by mouth 2 (two) times daily.   benzonatate 200 MG capsule Commonly known as: TESSALON Take 1 capsule (200 mg total) by mouth 3 (three) times daily as needed for cough.   calcium carbonate 500 MG chewable tablet Commonly known as: TUMS - dosed in mg elemental calcium Chew 2 tablets by mouth 2 (two) times daily as needed for indigestion or heartburn.   cefTRIAXone IVPB Commonly known as: ROCEPHIN Inject 2 g into the vein daily. Indication: recurrent R-knee PJI First Dose: Yes Last Day of Therapy:  01/17/24 Labs - Once weekly:  CBC/D and BMP, Labs - Once weekly: ESR and CRP Method of administration: IV Push Method of administration may be changed at the discretion of home infusion pharmacist based upon assessment of the patient and/or caregiver's ability to self-administer the medication ordered.   daptomycin IVPB Commonly known as: CUBICIN Inject 700 mg into the vein daily. Indication:  R-knee PJI First Dose: Yes Last Day of Therapy:  01/17/24 Labs - Once weekly:  CBC/D, BMP, and CPK Labs - Once weekly: ESR and CRP Method of administration: IV Push Method of administration may be changed at the discretion of home infusion pharmacist based upon assessment of the patient and/or caregiver's ability to self-administer the medication ordered.   furosemide 20 MG tablet Commonly known as: Lasix Take 1 tablet (20 mg total) by mouth daily as needed for fluid or edema.   gabapentin 300 MG capsule Commonly known as: NEURONTIN Take 300 mg by mouth 2 (two) times daily.   methocarbamol 500 MG tablet Commonly known as: ROBAXIN Take 1  tablet (500 mg total) by mouth every 6 (six) hours as needed for muscle spasms.   metroNIDAZOLE 500 MG tablet Commonly known as: Flagyl Take 1 tablet (500 mg total) by mouth 2 (two) times daily.   metroNIDAZOLE 500 MG tablet Commonly known as: FLAGYL Take 1 tablet (500 mg total) by mouth every 12 (twelve) hours.   naproxen 500 MG tablet Commonly known as: NAPROSYN Take 500 mg by mouth daily.   oxyCODONE 5 MG immediate release tablet Commonly  known as: Oxy IR/ROXICODONE Take 1 tablet (5 mg total) by mouth every 4 (four) hours as needed for severe pain (pain score 7-10).   polyethylene glycol 17 g packet Commonly known as: MIRALAX / GLYCOLAX Take 17 g by mouth 2 (two) times daily. What changed:  when to take this reasons to take this   saccharomyces boulardii 250 MG capsule Commonly known as: FLORASTOR Take 250 mg by mouth daily.   sertraline 100 MG tablet Commonly known as: ZOLOFT Take 1 tablet (100 mg total) by mouth daily.   sodium chloride 0.65 % Soln nasal spray Commonly known as: OCEAN Place 1 spray into both nostrils daily as needed for congestion.   tamsulosin 0.4 MG Caps capsule Commonly known as: FLOMAX Take 0.4 mg by mouth at bedtime.   telmisartan 80 MG tablet Commonly known as: MICARDIS Take 1 tablet (80 mg total) by mouth daily.               Discharge Care Instructions  (From admission, onward)           Start     Ordered   12/07/23 0000  Change dressing on IV access line weekly and PRN  (Home infusion instructions - [facility] )        12/07/23 0923            Diagnostic Studies: [name] EKG SITE RITE Result Date: 12/05/2023 If Site Rite image not attached, placement could not be confirmed due to current cardiac rhythm.   Disposition: Discharge disposition: 01-Home or Self Care       Discharge Instructions     [facility] pharmacist to adjust dose for Vancomycin, Aminoglycosides and other anti-infective  therapies as requested by physician.   Complete by: As directed    [facility] to provide Cath Flo 2mg    Complete by: As directed    Administer for PICC line occlusion and as ordered by physician for other access device issues.   Anaphylaxis Kit: Provided to treat any anaphylactic reaction to the medication being provided to the patient if First Dose or when requested by physician   Complete by: As directed    Epinephrine 1mg /ml vial / amp: Administer 0.3mg  (0.62ml) subcutaneously once for moderate to severe anaphylaxis, nurse to call physician and pharmacy when reaction occurs and call 911 if needed for immediate care   Diphenhydramine 50mg /ml IV vial: Administer 25-50mg  IV/IM PRN for first dose reaction, rash, itching, mild reaction, nurse to call physician and pharmacy when reaction occurs   Sodium Chloride 0.9% NS IV: Administer if needed for hypovolemic blood pressure drop or as ordered by physician after call to physician with anaphylactic reaction   Change dressing on IV access line weekly and PRN   Complete by: As directed    Flush IV access with Sodium Chloride 0.9% and Heparin 10 units/ml or 100 units/ml   Complete by: As directed    Home infusion instructions - [facility]   Complete by: As directed    Instructions: Flush IV access with Sodium Chloride 0.9% and Heparin 10units/ml or 100units/ml   Change dressing on IV access line: Weekly and PRN   Instructions Cath Flo 2mg : Administer for PICC Line occlusion and as ordered by physician for other access device   [facility] pharmacist to adjust dose for: Vancomycin, Aminoglycosides and other anti-infective therapies as requested by physician   Method of administration may be changed at the discretion of home infusion pharmacist  based upon assessment of the patient and/or caregiver's ability to self-administer the medication ordered   Complete by: As directed         Follow-up Information      Durene Romans, MD. Schedule an appointment as soon as possible for a visit in 1 week(s).   Specialty: Orthopedic Surgery Contact information: [address] [place] [place] [address] [phone]                  Signed: Cassandria Anger 12/21/2023, 9:10 AM

## 2023-12-21 NOTE — Telephone Encounter (Signed)
 Per Dr. Elinor Parkinson d/c IV antibiotics today and picc can be pulled by [facility] ASAP. Community message forwarded to [employer] team. Juanita Laster, RMA

## 2023-12-23 LAB — CULTURE, BLOOD (ROUTINE X 2)
Culture: NO GROWTH
Culture: NO GROWTH

## 2023-12-25 DIAGNOSIS — M96842 Postprocedural seroma of a musculoskeletal structure following a musculoskeletal system procedure: Secondary | ICD-10-CM | POA: Diagnosis not present

## 2023-12-25 DIAGNOSIS — B9561 Methicillin susceptible Staphylococcus aureus infection as the cause of diseases classified elsewhere: Secondary | ICD-10-CM | POA: Diagnosis not present

## 2023-12-25 DIAGNOSIS — T8453XA Infection and inflammatory reaction due to internal right knee prosthesis, initial encounter: Secondary | ICD-10-CM | POA: Diagnosis not present

## 2023-12-25 DIAGNOSIS — I10 Essential (primary) hypertension: Secondary | ICD-10-CM | POA: Diagnosis not present

## 2023-12-27 ENCOUNTER — Other Ambulatory Visit (HOSPITAL_COMMUNITY): Payer: Self-pay

## 2024-01-03 DIAGNOSIS — B9561 Methicillin susceptible Staphylococcus aureus infection as the cause of diseases classified elsewhere: Secondary | ICD-10-CM | POA: Diagnosis not present

## 2024-01-03 DIAGNOSIS — T8453XA Infection and inflammatory reaction due to internal right knee prosthesis, initial encounter: Secondary | ICD-10-CM | POA: Diagnosis not present

## 2024-01-03 DIAGNOSIS — I10 Essential (primary) hypertension: Secondary | ICD-10-CM | POA: Diagnosis not present

## 2024-01-03 DIAGNOSIS — M96842 Postprocedural seroma of a musculoskeletal structure following a musculoskeletal system procedure: Secondary | ICD-10-CM | POA: Diagnosis not present

## 2024-01-16 ENCOUNTER — Other Ambulatory Visit: Payer: Self-pay

## 2024-01-16 ENCOUNTER — Ambulatory Visit: Admitting: Infectious Diseases

## 2024-01-16 VITALS — BP 135/81 | HR 82 | Temp 98.0°F | Wt 220.0 lb

## 2024-01-16 DIAGNOSIS — M009 Pyogenic arthritis, unspecified: Secondary | ICD-10-CM

## 2024-01-16 DIAGNOSIS — Z8619 Personal history of other infectious and parasitic diseases: Secondary | ICD-10-CM | POA: Diagnosis not present

## 2024-01-16 DIAGNOSIS — Z79899 Other long term (current) drug therapy: Secondary | ICD-10-CM | POA: Diagnosis not present

## 2024-01-16 DIAGNOSIS — T8453XD Infection and inflammatory reaction due to internal right knee prosthesis, subsequent encounter: Secondary | ICD-10-CM

## 2024-01-16 MED ORDER — AMOXICILLIN-POT CLAVULANATE 875-125 MG PO TABS
1.0000 | ORAL_TABLET | Freq: Two times a day (BID) | ORAL | 5 refills | Status: DC
Start: 2024-01-16 — End: 2024-03-07

## 2024-01-16 MED ORDER — DOXYCYCLINE HYCLATE 100 MG PO TABS: 100.0000 mg | ORAL_TABLET | Freq: Two times a day (BID) | ORAL | 5 refills | Status: DC

## 2024-01-16 NOTE — Progress Notes (Unsigned)
 Patient Active Problem List   Diagnosis Date Noted   Medication management 12/21/2023   PICC (peripherally inserted central catheter) in place 12/21/2023   Antibiotic-associated diarrhea 12/21/2023   Postoperative seroma of musculoskeletal structure after musculoskeletal procedure 10/03/2023   Postoperative seroma of subcutaneous tissue after non-dermatologic procedure 10/03/2023   Blood loss anemia 08/16/2023   S/P revision of total knee, right 06/01/2023   Infection of prosthetic right knee joint (HCC) 02/21/2023   Infection of right knee (HCC) 11/14/2022   Chronic sinusitis 09/02/2022   Encounter for general adult medical examination with abnormal findings 07/09/2021   BPH (benign prostatic hyperplasia) 07/09/2021   MDD (major depressive disorder), recurrent, in full remission (HCC) 07/09/2021   Essential hypertension 10/20/2008   Current Outpatient Medications on File Prior to Visit  Medication Sig Dispense Refill   calcium carbonate (TUMS - DOSED IN MG ELEMENTAL CALCIUM) 500 MG chewable tablet Chew 2 tablets by mouth 2 (two) times daily as needed for indigestion or heartburn.     Ketotifen Fumarate (ALLERGY EYE DROPS OP) Place 1 drop into both eyes daily as needed (red/itchy eyes).     naproxen (NAPROSYN) 500 MG tablet Take 500 mg by mouth daily.     saccharomyces boulardii (FLORASTOR) 250 MG capsule Take 250 mg by mouth daily.     sertraline (ZOLOFT) 100 MG tablet Take 1 tablet (100 mg total) by mouth daily. 90 tablet 3   sodium chloride (OCEAN) 0.65 % SOLN nasal spray Place 1 spray into both nostrils daily as needed for congestion.     tamsulosin (FLOMAX) 0.4 MG CAPS capsule Take 0.4 mg by mouth at bedtime.  0   telmisartan (MICARDIS) 80 MG tablet Take 1 tablet (80 mg total) by mouth daily. 90 tablet 3   No current facility-administered medications on file prior to visit.   Subjective: 73 year old male with PMH as below including HTN, bronchitis, GERD, skin cancer,  arthritis, depression, right knee PJI with MSSA status post I&D on 11/15/2022 treated with 8 weeks IV cefazolin followed by suppressive PO cefadroxil complicated by recurrent PJI requiring resection of right knee arthroplasty and spacer placement 02/21/23 ( OR Cx with finegoldia magna). He was initially started on Dapto and then transition to cefazolin, added metronidazole as cultures grew Finegoldia magna, EOT 04/04/23. He had knee reimplantation in 06/01/23 after which he did well for a couple of months but started having recurrent prepatellar swellings and seromas since October 2024, s/p multiple fluid aspirations by Dr Nilsa Nutting office with no cultures take.   He was admitted 12/31-1/2 for post op seroma and underwent I and D with wound vac application on 12/31 (no cx taken). He was last admitted 2/11-2/12 for post op seroma and underwent I and D with primary wound closure and prevena wound vac on 2/11 ( no cx taken).  Per chart he has been on cefadroxil since early January although wife thinks he may have been taking cefadroxil even before that.    3/5 S/p debridement with primary wound closure and placement of Prevena wound VAC.  No cx taken.  Patient was discharged on 3/6 with plan for 6 weeks of daptomycin, ceftriaxone and metronidazole, EOT 4/16 for presumptive culture negative rt knee PJI.   Seen in the ED 3/17 with concerns for PICC line infection/generalized weakness, diarrhea, dark colored urine, soft BP. He was given IVF. PICC with some erythema but no other signs of infection like warmth or tenderness. He was discharged after IVF. Blood cx  2/2 sets negative. Antibiotics have been on hold since then  3/20 Accompanied by wife. Saw Dr Charlann Boxer Monday, sutures were removed since rt knee wound had healed. He reports Dr Charlann Boxer said him another surgery in his knee wound be morbid and would not be recommended and prefers him to be on abtx. He expressed reluctance to continue IV abtx given his symptoms leading to  ED visit. He never felt so bad as he did. All of his symptoms have resolved after stopping abtx. RT knee wound has healed with no symptoms concerning for infection.   01/16/24 Accompanied by wife. Taking PO augmentin and doxycycline as instructed. Denies missing doses. No concerns with antibiotics or rt knee. Rt knee has healed. Reports Rt knee looks normal like his left knee in the morning but swells up when he moves around in the day time. He is happy with the healing of rt knee wound. He says Dr Charlann Boxer says another surgery in his rt knee is morbid and prefers him to be possibly on life long antibiotics which patient and wife is OK with if needed.   Review of Systems: Denies fevers, chills. Denies nausea, vomiting, diarrhea. Denies pain, tenderness or drainage from rt knee wound.    Past Medical History:  Diagnosis Date   Arthritis    Bronchitis    Depression    GERD (gastroesophageal reflux disease)    Hypertension    Skin cancer    Past Surgical History:  Procedure Laterality Date   APPLICATION OF WOUND VAC Right 10/03/2023   Procedure: APPLICATION OF WOUND VAC;  Surgeon: Durene Romans, MD;  Location: [facility];  Service: Orthopedics;  Laterality: Right;   CHOLECYSTECTOMY N/A 02/10/2021   Procedure: LAPAROSCOPIC CHOLECYSTECTOMY;  Surgeon: Harriette Bouillon, MD;  Location: [facility];  Service: General;  Laterality: N/A;   EXCISIONAL TOTAL KNEE ARTHROPLASTY WITH ANTIBIOTIC SPACERS Right 02/21/2023   Procedure: EXCISIONAL TOTAL KNEE ARTHROPLASTY WITH ANTIBIOTIC SPACERS;  Surgeon: Durene Romans, MD;  Location: [facility];  Service: Orthopedics;  Laterality: Right;   HERNIA REPAIR     I & D KNEE WITH POLY EXCHANGE Right 11/15/2022   Procedure: IRRIGATION AND DEBRIDEMENT KNEE WITH  POLY EXCHANGE;  Surgeon: Eugenia Mcalpine, MD;  Location: [facility];  Service: Orthopedics;  Laterality: Right;  adductor canal, no antibiotics prior to culture add on room to follow other case 60   IRRIGATION AND DEBRIDEMENT KNEE  Right 10/03/2023   Procedure: IRRIGATION AND DEBRIDEMENT KNEE;  Surgeon: Durene Romans, MD;  Location: [facility];  Service: Orthopedics;  Laterality: Right;  1 HR   IRRIGATION AND DEBRIDEMENT KNEE Right 11/14/2023   Procedure: RIGHT KNEE IRRIGATION AND DEBRIDEMENT AND WOUND CLOSURE;  Surgeon: Durene Romans, MD;  Location: [facility];  Service: Orthopedics;  Laterality: Right;   IRRIGATION AND DEBRIDEMENT KNEE Right 12/05/2023   Procedure: IRRIGATION AND DEBRIDEMENT KNEE;  Surgeon: Durene Romans, MD;  Location: [facility];  Service: Orthopedics;  Laterality: Right;  1 HR   Knee Surgery Right 10/25/2022   REIMPLANTATION OF TOTAL KNEE Right 06/01/2023   Procedure: REIMPLANTATION/REVISION OF TOTAL KNEE;  Surgeon: Durene Romans, MD;  Location: [facility];  Service: Orthopedics;  Laterality: Right;   ROTATOR CUFF REPAIR     SKIN CANCER EXCISION      Social History   Tobacco Use   Smoking status: Never   Smokeless tobacco: Never  Vaping Use   Vaping status: Never Used  Substance Use Topics   Alcohol use: Yes    Comment: socially  Drug use: No    Family History  Problem Relation Age of Onset   Emphysema Mother        heart failure indusce emphysema   Heart disease Father    Heart attack Father    Heart disease Sister 24       [id number]    Allergies  Allergen Reactions   Avapro [Irbesartan] Cough   Chlorhexidine Rash    Reaction to PICC line dressing with Chlorhexidine gel Not allergic to Chlorhexidine soap   Lexapro [Escitalopram Oxalate] Anxiety    insomnia    Health Maintenance  Topic Date Due   Zoster Vaccines- Shingrix (1 of 2) 04/23/1971   Colonoscopy  Never done   COVID-19 Vaccine (6 - 2024-25 season) 06/04/2023   INFLUENZA VACCINE  05/03/2024   Medicare Annual Wellness (AWV)  06/28/2024   DTaP/Tdap/Td (4 - Td or Tdap) 04/02/2029   Pneumonia Vaccine 39+ Years old  Completed   Hepatitis C Screening  Completed   HPV VACCINES  Aged Out   Meningococcal B Vaccine  Aged Out     Objective: BP 135/81   Pulse 82   Temp 98 F (36.7 C) (Oral)   Wt 220 lb (99.8 kg)   SpO2 98%   BMI 32.49 kg/m  '  Physical Exam Constitutional:      Appearance: Normal appearance.  HENT:     Head: Normocephalic and atraumatic.      Mouth: Mucous membranes are moist.  Eyes:    Conjunctiva/sclera: Conjunctivae normal.     Pupils: Pupils are equal, round, and b/l symmetrical   Cardiovascular:     Rate and Rhythm: Normal rate and regular rhythm.     Heart sounds:  Pulmonary:     Effort: Pulmonary effort is normal.     Breath sounds:   Abdominal:     General: Non distended     Palpations:   Musculoskeletal:        General: Normal range of motion. Ambulatory. Minimal rt knee swelling with no signs of septic arthritis.  Rt knee wound has healed with no signs of infection  Skin:    General: Skin is warm and dry.     Comments:   Neurological:     General: grossly non focal     Mental Status: awake, alert and oriented to person, place, and time.   Psychiatric:        Mood and Affect: Mood normal.   Lab Results Lab Results  Component Value Date   WBC 2.9 (L) 12/18/2023   HGB 12.1 (L) 12/18/2023   HCT 37.2 (L) 12/18/2023   MCV 84.5 12/18/2023   PLT 129 (L) 12/18/2023    Lab Results  Component Value Date   CREATININE 0.85 12/18/2023   BUN 19 12/18/2023   NA 130 (L) 12/18/2023   K 3.6 12/18/2023   CL 101 12/18/2023   CO2 21 (L) 12/18/2023    Lab Results  Component Value Date   ALT 40 12/18/2023   AST 46 (H) 12/18/2023   ALKPHOS 54 12/18/2023   BILITOT 0.7 12/18/2023    Lab Results  Component Value Date   CHOL 184 08/15/2023   HDL 72.30 08/15/2023   LDLCALC 100 (H) 08/15/2023   TRIG 54.0 08/15/2023   CHOLHDL 3 08/15/2023   No results found for: "LABRPR", "RPRTITER" No results found for: "HIV1RNAQUANT", "HIV1RNAVL", "CD4TABS"  Assessment/plan # Recurrent rt knee PJI  # Non healing surgical wound of rt knee  - prior h/o MSSA  followed by  finegoldia magna in 2024 s/p surgical intervention and antibiotics as above complicated by recurrent postop seroma as well as nonhealing surgical wound requiring multiple aspirations as well as debridements as abive without any cultures taken. - Discharged on 3/6 with plan for 6 weeks of daptomycin, ceftriaxone and metronidazole, EOT 4/16 for presumptive culture negative rt knee PJI.  - 3/17 IV abtx held in the setting of severe diarrhea and switched to PO doxycycline and augmentin since 3/20 clinic visit - 3/20 clinically rt knee wound has healed   Plan  - continue PO doxycycline and augmentin, likely life long as he has been considered a non surgical candidate by Ortho - Fu in 6 weeks  - Fu with Orthopedics   # Medication Monitoring  - labs today   I have personally spent 30 minutes involved in face-to-face and non-face-to-face activities for this patient on the day of the visit. Professional time spent includes the following activities: Preparing to see the patient (review of tests), Obtaining and/or reviewing separately obtained history (admission/discharge record), Performing a medically appropriate examination and/or evaluation , Ordering medications/tests/procedures, referring and communicating with other health care professionals, Documenting clinical information in the EMR, Independently interpreting results (not separately reported), Communicating results to the patient/family/caregiver, Counseling and educating the patient/family/caregiver and Care coordination (not separately reported).   Of note, portions of this note may have been created with voice recognition software. While this note has been edited for accuracy, occasional wrong-word or 'sound-a-like' substitutions may have occurred due to the inherent limitations of voice recognition software.   Melvina Stage, MD [facility] 01/16/2024, 11:39 AM

## 2024-01-16 NOTE — Progress Notes (Signed)
 My response to the following coding query: Please clarify the medical condition(s) necessitating the order for SARS,RSV,Flu A&B   Evaluate for viral illness

## 2024-01-17 DIAGNOSIS — H524 Presbyopia: Secondary | ICD-10-CM | POA: Diagnosis not present

## 2024-01-17 DIAGNOSIS — H2513 Age-related nuclear cataract, bilateral: Secondary | ICD-10-CM | POA: Diagnosis not present

## 2024-01-17 LAB — COMPREHENSIVE METABOLIC PANEL WITH GFR
AG Ratio: 2 (calc) (ref 1.0–2.5)
ALT: 24 U/L (ref 9–46)
AST: 25 U/L (ref 10–35)
Albumin: 4 g/dL (ref 3.6–5.1)
Alkaline phosphatase (APISO): 78 U/L (ref 35–144)
BUN: 20 mg/dL (ref 7–25)
CO2: 26 mmol/L (ref 20–32)
Calcium: 9.2 mg/dL (ref 8.6–10.3)
Chloride: 104 mmol/L (ref 98–110)
Creat: 0.72 mg/dL (ref 0.70–1.28)
Globulin: 2 g/dL (ref 1.9–3.7)
Glucose, Bld: 97 mg/dL (ref 65–99)
Potassium: 4.2 mmol/L (ref 3.5–5.3)
Sodium: 138 mmol/L (ref 135–146)
Total Bilirubin: 0.3 mg/dL (ref 0.2–1.2)
Total Protein: 6 g/dL — ABNORMAL LOW (ref 6.1–8.1)
eGFR: 98 mL/min/{1.73_m2} (ref >=60)

## 2024-01-17 LAB — C-REACTIVE PROTEIN: CRP: 11.8 mg/L — ABNORMAL HIGH (ref <8.0)

## 2024-01-17 LAB — CBC
HCT: 36.9 % — ABNORMAL LOW (ref 38.5–50.0)
Hemoglobin: 11.7 g/dL — ABNORMAL LOW (ref 13.2–17.1)
MCH: 26.1 pg — ABNORMAL LOW (ref 27.0–33.0)
MCHC: 31.7 g/dL — ABNORMAL LOW (ref 32.0–36.0)
MCV: 82.4 fL (ref 80.0–100.0)
MPV: 10.6 fL (ref 7.5–12.5)
Platelets: 285 10*3/uL (ref 140–400)
RBC: 4.48 10*6/uL (ref 4.20–5.80)
RDW: 15.6 % — ABNORMAL HIGH (ref 11.0–15.0)
WBC: 7 10*3/uL (ref 3.8–10.8)

## 2024-01-17 LAB — SEDIMENTATION RATE: Sed Rate: 2 mm/h (ref 0–20)

## 2024-01-18 ENCOUNTER — Telehealth: Payer: Self-pay

## 2024-01-18 NOTE — Telephone Encounter (Signed)
-----   Message from Melvina Stage sent at 01/18/2024  8:34 AM EDT ----- Labs reviewed , no significant abnormality  CRP is slightly elevated but that is not significant and nothing to worry.

## 2024-02-28 ENCOUNTER — Ambulatory Visit: Admitting: Infectious Diseases

## 2024-03-07 ENCOUNTER — Ambulatory Visit: Admitting: Infectious Diseases

## 2024-03-07 ENCOUNTER — Encounter: Payer: Self-pay | Admitting: Infectious Diseases

## 2024-03-07 ENCOUNTER — Other Ambulatory Visit: Payer: Self-pay

## 2024-03-07 VITALS — BP 147/81 | HR 84 | Resp 16 | Ht 69.0 in | Wt 223.0 lb

## 2024-03-07 DIAGNOSIS — T8453XD Infection and inflammatory reaction due to internal right knee prosthesis, subsequent encounter: Secondary | ICD-10-CM | POA: Diagnosis not present

## 2024-03-07 DIAGNOSIS — B9561 Methicillin susceptible Staphylococcus aureus infection as the cause of diseases classified elsewhere: Secondary | ICD-10-CM

## 2024-03-07 DIAGNOSIS — Z79899 Other long term (current) drug therapy: Secondary | ICD-10-CM | POA: Diagnosis not present

## 2024-03-07 MED ORDER — AMOXICILLIN-POT CLAVULANATE 500-125 MG PO TABS: 1.0000 | ORAL_TABLET | Freq: Two times a day (BID) | ORAL | 5 refills | Status: DC

## 2024-03-07 NOTE — Progress Notes (Unsigned)
 Patient Active Problem List   Diagnosis Date Noted   Medication management 12/21/2023   PICC (peripherally inserted central catheter) in place 12/21/2023   Antibiotic-associated diarrhea 12/21/2023   Postoperative seroma of musculoskeletal structure after musculoskeletal procedure 10/03/2023   Postoperative seroma of subcutaneous tissue after non-dermatologic procedure 10/03/2023   Blood loss anemia 08/16/2023   S/P revision of total knee, right 06/01/2023   Infection of prosthetic right knee joint (HCC) 02/21/2023   Infection of right knee (HCC) 11/14/2022   Chronic sinusitis 09/02/2022   Encounter for general adult medical examination with abnormal findings 07/09/2021   BPH (benign prostatic hyperplasia) 07/09/2021   MDD (major depressive disorder), recurrent, in full remission (HCC) 07/09/2021   Essential hypertension 10/20/2008   Current Outpatient Medications on File Prior to Visit  Medication Sig Dispense Refill   amoxicillin -clavulanate (AUGMENTIN ) 875-125 MG tablet Take 1 tablet by mouth 2 (two) times daily. 60 tablet 5   calcium  carbonate (TUMS - DOSED IN MG ELEMENTAL CALCIUM ) 500 MG chewable tablet Chew 2 tablets by mouth 2 (two) times daily as needed for indigestion or heartburn.     doxycycline  (VIBRA -TABS) 100 MG tablet Take 1 tablet (100 mg total) by mouth 2 (two) times daily. 60 tablet 5   Ketotifen Fumarate (ALLERGY EYE DROPS OP) Place 1 drop into both eyes daily as needed (red/itchy eyes).     naproxen  (NAPROSYN ) 500 MG tablet Take 500 mg by mouth daily.     saccharomyces boulardii (FLORASTOR) 250 MG capsule Take 250 mg by mouth daily.     sertraline  (ZOLOFT ) 100 MG tablet Take 1 tablet (100 mg total) by mouth daily. 90 tablet 3   sodium chloride  (OCEAN) 0.65 % SOLN nasal spray Place 1 spray into both nostrils daily as needed for congestion.     tamsulosin  (FLOMAX ) 0.4 MG CAPS capsule Take 0.4 mg by mouth at bedtime.  0   telmisartan  (MICARDIS ) 80 MG tablet Take 1  tablet (80 mg total) by mouth daily. 90 tablet 3   No current facility-administered medications on file prior to visit.   Subjective: 73 year old male with PMH as below including HTN, bronchitis, GERD, skin cancer, arthritis, depression, right knee PJI with MSSA status post I&D on 11/15/2022 treated with 8 weeks IV cefazolin  followed by suppressive PO cefadroxil  complicated by recurrent PJI requiring resection of right knee arthroplasty and spacer placement 02/21/23 ( OR Cx with finegoldia magna). He was initially started on Dapto and then transition to cefazolin , added metronidazole  as cultures grew Finegoldia magna, EOT 04/04/23. He had knee reimplantation in 06/01/23 after which he did well for a couple of months but started having recurrent prepatellar swellings and seromas since October 2024, s/p multiple fluid aspirations by Dr Marcelle Sergeant office with no cultures take.   He was admitted 12/31-1/2 for post op seroma and underwent I and D with wound vac application on 12/31 (no cx taken). He was last admitted 2/11-2/12 for post op seroma and underwent I and D with primary wound closure and prevena wound vac on 2/11 ( no cx taken).  Per chart he has been on cefadroxil  since early January although wife thinks he may have been taking cefadroxil  even before that.    3/5 S/p debridement with primary wound closure and placement of Prevena wound VAC.  No cx taken.  Patient was discharged on 3/6 with plan for 6 weeks of daptomycin , ceftriaxone  and metronidazole , EOT 4/16 for presumptive culture negative rt knee PJI.   Seen in  the ED 3/17 with concerns for PICC line infection/generalized weakness, diarrhea, dark colored urine, soft BP. He was given IVF. PICC with some erythema but no other signs of infection like warmth or tenderness. He was discharged after IVF. Blood cx 2/2 sets negative. Antibiotics have been on hold since then  3/20 Accompanied by wife. Saw Dr Bernard Brick Monday, sutures were removed since rt knee wound  had healed. He reports Dr Bernard Brick said him another surgery in his knee wound be morbid and would not be recommended and prefers him to be on abtx. He expressed reluctance to continue IV abtx given his symptoms leading to ED visit. He never felt so bad as he did. All of his symptoms have resolved after stopping abtx. RT knee wound has healed with no symptoms concerning for infection.   01/16/24 Accompanied by wife. Taking PO augmentin  and doxycycline  as instructed. Denies missing doses. No concerns with antibiotics or rt knee. Rt knee has healed. Reports Rt knee looks normal like his left knee in the morning but swells up when he moves around in the day time. He is happy with the healing of rt knee wound. He says Dr Bernard Brick says another surgery in his rt knee is morbid and prefers him to be possibly on life long antibiotics which patient and wife is OK with if needed.   03/07/24 Reports compliance with PO doxycycline  and augmentin   however, experiences increased bowel movements, increasing from his usual four times a day to eight to ten times a day, and thinks augmentin  is the culprit.  The stools are soft, denies abdominal pain, fever, chills, nausea, or vomiting. He takes Florastor, a probiotic, 250 mg twice daily, which he feels helps with loose stools. Last seen by Dr Bernard Brick a month ago and was told swelling in his rt leg may take at least a year to resolve due to need " to mess with his lymph nodes during surgery" which possibly has affected drainage.   Review of Systems: Denies pain, tenderness, warmth  or drainage from rt knee wound. All systems reviewed with pertinent positives and negatives as listed above   Past Medical History:  Diagnosis Date   Arthritis    Bronchitis    Depression    GERD (gastroesophageal reflux disease)    Hypertension    Skin cancer    Past Surgical History:  Procedure Laterality Date   APPLICATION OF WOUND VAC Right 10/03/2023   Procedure: APPLICATION OF WOUND VAC;   Surgeon: Claiborne Crew, MD;  Location: [facility];  Service: Orthopedics;  Laterality: Right;   CHOLECYSTECTOMY N/A 02/10/2021   Procedure: LAPAROSCOPIC CHOLECYSTECTOMY;  Surgeon: Sim Dryer, MD;  Location: [facility];  Service: General;  Laterality: N/A;   EXCISIONAL TOTAL KNEE ARTHROPLASTY WITH ANTIBIOTIC SPACERS Right 02/21/2023   Procedure: EXCISIONAL TOTAL KNEE ARTHROPLASTY WITH ANTIBIOTIC SPACERS;  Surgeon: Claiborne Crew, MD;  Location: [facility];  Service: Orthopedics;  Laterality: Right;   HERNIA REPAIR     I & D KNEE WITH POLY EXCHANGE Right 11/15/2022   Procedure: IRRIGATION AND DEBRIDEMENT KNEE WITH  POLY EXCHANGE;  Surgeon: Genevie Kerns, MD;  Location: [facility];  Service: Orthopedics;  Laterality: Right;  adductor canal, no antibiotics prior to culture add on room to follow other case 60   IRRIGATION AND DEBRIDEMENT KNEE Right 10/03/2023   Procedure: IRRIGATION AND DEBRIDEMENT KNEE;  Surgeon: Claiborne Crew, MD;  Location: [facility];  Service: Orthopedics;  Laterality: Right;  1 HR   IRRIGATION AND DEBRIDEMENT KNEE Right  11/14/2023   Procedure: RIGHT KNEE IRRIGATION AND DEBRIDEMENT AND WOUND CLOSURE;  Surgeon: Claiborne Crew, MD;  Location: [facility];  Service: Orthopedics;  Laterality: Right;   IRRIGATION AND DEBRIDEMENT KNEE Right 12/05/2023   Procedure: IRRIGATION AND DEBRIDEMENT KNEE;  Surgeon: Claiborne Crew, MD;  Location: [facility];  Service: Orthopedics;  Laterality: Right;  1 HR   Knee Surgery Right 10/25/2022   REIMPLANTATION OF TOTAL KNEE Right 06/01/2023   Procedure: REIMPLANTATION/REVISION OF TOTAL KNEE;  Surgeon: Claiborne Crew, MD;  Location: [facility];  Service: Orthopedics;  Laterality: Right;   ROTATOR CUFF REPAIR     SKIN CANCER EXCISION      Social History   Tobacco Use   Smoking status: Never   Smokeless tobacco: Never  Vaping Use   Vaping status: Never Used  Substance Use Topics   Alcohol use: Yes    Comment: socially   Drug use: No    Family History  Problem Relation Age  of Onset   Emphysema Mother        heart failure indusce emphysema   Heart disease Father    Heart attack Father    Heart disease Sister 58       cabgx3    Allergies  Allergen Reactions   Avapro  [Irbesartan ] Cough   Chlorhexidine  Rash    Reaction to PICC line dressing with Chlorhexidine  gel Not allergic to Chlorhexidine  soap   Lexapro [Escitalopram Oxalate] Anxiety    insomnia    Health Maintenance  Topic Date Due   Zoster Vaccines- Shingrix (1 of 2) 04/23/1971   Colonoscopy  Never done   COVID-19 Vaccine (6 - 2024-25 season) 06/04/2023   INFLUENZA VACCINE  05/03/2024   Medicare Annual Wellness (AWV)  06/28/2024   DTaP/Tdap/Td (5 - Td or Tdap) 04/02/2029   Pneumonia Vaccine 27+ Years old  Completed   Hepatitis C Screening  Completed   HPV VACCINES  Aged Out   Meningococcal B Vaccine  Aged Out    Objective: BP (!) 147/81   Pulse 84   Resp 16   Ht 5\' 9"  (1.753 m)   Wt 223 lb (101.2 kg)   SpO2 99%   BMI 32.93 kg/m   Physical Exam Constitutional:      Appearance: Normal appearance.  HENT:     Head: Normocephalic and atraumatic.      Mouth: Mucous membranes are moist.  Eyes:    Conjunctiva/sclera: Conjunctivae normal.     Pupils: Pupils are equal, round, and b/l symmetrical   Cardiovascular:     Rate and Rhythm: Normal rate and regular rhythm.     Heart sounds:  Pulmonary:     Effort: Pulmonary effort is normal.     Breath sounds:   Abdominal:     General: Non distended     Palpations:   Musculoskeletal:        General: Normal range of motion. Ambulatory. Rt leg wound has healed. Chronic rt leg swelling with no signs of infection. No warmth, tenderness or redness. Calves soft  Skin:    General: Skin is warm and dry.     Comments:   Neurological:     General: grossly non focal     Mental Status: awake, alert and oriented to person, place, and time.   Psychiatric:        Mood and Affect: Mood normal.   Lab Results Lab Results  Component Value  Date   WBC 7.0 01/16/2024   HGB 11.7 (L) 01/16/2024  HCT 36.9 (L) 01/16/2024   MCV 82.4 01/16/2024   PLT 285 01/16/2024    Lab Results  Component Value Date   CREATININE 0.72 01/16/2024   BUN 20 01/16/2024   NA 138 01/16/2024   K 4.2 01/16/2024   CL 104 01/16/2024   CO2 26 01/16/2024    Lab Results  Component Value Date   ALT 24 01/16/2024   AST 25 01/16/2024   ALKPHOS 54 12/18/2023   BILITOT 0.3 01/16/2024    Lab Results  Component Value Date   CHOL 184 08/15/2023   HDL 72.30 08/15/2023   LDLCALC 100 (H) 08/15/2023   TRIG 54.0 08/15/2023   CHOLHDL 3 08/15/2023   No results found for: "LABRPR", "RPRTITER" No results found for: "HIV1RNAQUANT", "HIV1RNAVL", "CD4TABS"  Assessment/plan # Recurrent rt knee PJI  # Non healing surgical wound of rt knee  - prior h/o MSSA followed by finegoldia magna in 2024 s/p surgical intervention and antibiotics as above complicated by recurrent postop seroma as well as nonhealing surgical wound requiring multiple aspirations as well as debridements as abive without any cultures taken. - Discharged on 3/6 with plan for 6 weeks of daptomycin , ceftriaxone  and metronidazole , EOT 4/16 for presumptive culture negative rt knee PJI.  - 3/17 IV abtx held in the setting of severe diarrhea and switched to PO doxycycline  and augmentin  since 3/20 clinic visit - 3/20 clinically rt knee wound has healed   Plan  - continue PO doxycycline  and augmentin  ( will decrease dose of augmentin  to 500/125 mg dosing), likely life long as he has been considered a non surgical candidate by Ortho - Fu in 8 weeks  - Fu with Orthopedics   # Medication Monitoring  - labs today   I spent 26 minutes involved in face-to-face and non-face-to-face activities for this patient on the day of the visit. Professional time spent includes the following activities: Preparing to see the patient (review of tests),  Performing a medically appropriate examination and evaluation ,  Ordering medications/labs, Documenting clinical information in the EMR, Independently interpreting results (not separately reported), Communicating results to the patient, Counseling and educating the patient and Care coordination (not separately reported).   Of note, portions of this note may have been created with voice recognition software. While this note has been edited for accuracy, occasional wrong-word or 'sound-a-like' substitutions may have occurred due to the inherent limitations of voice recognition software.   Melvina Stage, MD [facility] 03/07/2024, 10:31 AM

## 2024-03-08 ENCOUNTER — Ambulatory Visit: Payer: Self-pay | Admitting: Infectious Diseases

## 2024-03-08 LAB — CBC
HCT: 38.2 % — ABNORMAL LOW (ref 38.5–50.0)
Hemoglobin: 11.6 g/dL — ABNORMAL LOW (ref 13.2–17.1)
MCH: 25.6 pg — ABNORMAL LOW (ref 27.0–33.0)
MCHC: 30.4 g/dL — ABNORMAL LOW (ref 32.0–36.0)
MCV: 84.3 fL (ref 80.0–100.0)
MPV: 10 fL (ref 7.5–12.5)
Platelets: 317 10*3/uL (ref 140–400)
RBC: 4.53 10*6/uL (ref 4.20–5.80)
RDW: 15.5 % — ABNORMAL HIGH (ref 11.0–15.0)
WBC: 6.1 10*3/uL (ref 3.8–10.8)

## 2024-03-08 LAB — COMPREHENSIVE METABOLIC PANEL WITH GFR
AG Ratio: 1.8 (calc) (ref 1.0–2.5)
ALT: 21 U/L (ref 9–46)
AST: 19 U/L (ref 10–35)
Albumin: 4 g/dL (ref 3.6–5.1)
Alkaline phosphatase (APISO): 82 U/L (ref 35–144)
BUN: 21 mg/dL (ref 7–25)
CO2: 26 mmol/L (ref 20–32)
Calcium: 9.2 mg/dL (ref 8.6–10.3)
Chloride: 102 mmol/L (ref 98–110)
Creat: 0.71 mg/dL (ref 0.70–1.28)
Globulin: 2.2 g/dL (ref 1.9–3.7)
Glucose, Bld: 84 mg/dL (ref 65–99)
Potassium: 4.4 mmol/L (ref 3.5–5.3)
Sodium: 137 mmol/L (ref 135–146)
Total Bilirubin: 0.3 mg/dL (ref 0.2–1.2)
Total Protein: 6.2 g/dL (ref 6.1–8.1)
eGFR: 98 mL/min/{1.73_m2} (ref >=60)

## 2024-03-08 LAB — C-REACTIVE PROTEIN: CRP: 18.9 mg/L — ABNORMAL HIGH (ref <8.0)

## 2024-03-08 NOTE — Progress Notes (Signed)
 Left vm with results.  Julien Odor, RMA

## 2024-03-11 DIAGNOSIS — R3912 Poor urinary stream: Secondary | ICD-10-CM | POA: Diagnosis not present

## 2024-04-11 ENCOUNTER — Ambulatory Visit: Admitting: Internal Medicine

## 2024-04-11 ENCOUNTER — Other Ambulatory Visit: Payer: Self-pay

## 2024-04-11 ENCOUNTER — Encounter: Payer: Self-pay | Admitting: Internal Medicine

## 2024-04-11 VITALS — BP 122/73 | HR 75 | Temp 98.1°F | Ht 69.0 in | Wt 230.0 lb

## 2024-04-11 DIAGNOSIS — Z79899 Other long term (current) drug therapy: Secondary | ICD-10-CM | POA: Diagnosis not present

## 2024-04-11 DIAGNOSIS — T8453XA Infection and inflammatory reaction due to internal right knee prosthesis, initial encounter: Secondary | ICD-10-CM | POA: Diagnosis not present

## 2024-04-11 MED ORDER — AMOXICILLIN-POT CLAVULANATE 500-125 MG PO TABS: 1.0000 | ORAL_TABLET | Freq: Two times a day (BID) | ORAL | 5 refills | Status: DC

## 2024-04-11 MED ORDER — DOXYCYCLINE HYCLATE 100 MG PO TABS: 100.0000 mg | ORAL_TABLET | Freq: Two times a day (BID) | ORAL | 5 refills | Status: AC

## 2024-04-11 NOTE — Progress Notes (Signed)
 Patient Active Problem List   Diagnosis Date Noted   Medication management 12/21/2023   PICC (peripherally inserted central catheter) in place 12/21/2023   Antibiotic-associated diarrhea 12/21/2023   Postoperative seroma of musculoskeletal structure after musculoskeletal procedure 10/03/2023   Postoperative seroma of subcutaneous tissue after non-dermatologic procedure 10/03/2023   Blood loss anemia 08/16/2023   S/P revision of total knee, right 06/01/2023   Infection of prosthetic right knee joint (HCC) 02/21/2023   Infection of right knee (HCC) 11/14/2022   Chronic sinusitis 09/02/2022   Encounter for general adult medical examination with abnormal findings 07/09/2021   BPH (benign prostatic hyperplasia) 07/09/2021   MDD (major depressive disorder), recurrent, in full remission (HCC) 07/09/2021   Essential hypertension 10/20/2008    Patient's Medications  New Prescriptions   No medications on file  Previous Medications   AMOXICILLIN -CLAVULANATE (AUGMENTIN ) 500-125 MG TABLET    Take 1 tablet by mouth 2 (two) times daily.   CALCIUM  CARBONATE (TUMS - DOSED IN MG ELEMENTAL CALCIUM ) 500 MG CHEWABLE TABLET    Chew 2 tablets by mouth 2 (two) times daily as needed for indigestion or heartburn.   DOXYCYCLINE  (VIBRA -TABS) 100 MG TABLET    Take 1 tablet (100 mg total) by mouth 2 (two) times daily.   KETOTIFEN FUMARATE (ALLERGY EYE DROPS OP)    Place 1 drop into both eyes daily as needed (red/itchy eyes).   NAPROXEN  (NAPROSYN ) 500 MG TABLET    Take 500 mg by mouth daily.   SACCHAROMYCES BOULARDII (FLORASTOR) 250 MG CAPSULE    Take 250 mg by mouth daily.   SERTRALINE  (ZOLOFT ) 100 MG TABLET    Take 1 tablet (100 mg total) by mouth daily.   SODIUM CHLORIDE  (OCEAN) 0.65 % SOLN NASAL SPRAY    Place 1 spray into both nostrils daily as needed for congestion.   TAMSULOSIN  (FLOMAX ) 0.4 MG CAPS CAPSULE    Take 0.4 mg by mouth at bedtime.   TELMISARTAN  (MICARDIS ) 80 MG TABLET    Take 1  tablet (80 mg total) by mouth daily.  Modified Medications   No medications on file  Discontinued Medications   No medications on file    Subjective:  73 year old male with PMH as below including HTN, bronchitis, GERD, skin cancer, arthritis, depression, right knee PJI with MSSA status post I&D on 11/15/2022 treated with 8 weeks IV cefazolin  followed by suppressive PO cefadroxil  complicated by recurrent PJI requiring resection of right knee arthroplasty and spacer placement 02/21/23 ( OR Cx with finegoldia magna). He was initially started on Dapto and then transition to cefazolin , added metronidazole  as cultures grew Finegoldia magna, EOT 04/04/23. He had knee reimplantation in 06/01/23 after which he did well for a couple of months but started having recurrent prepatellar swellings and seromas since October 2024, s/p multiple fluid aspirations by Dr Lyndle office with no cultures take.   He was admitted 12/31-1/2 for post op seroma and underwent I and D with wound vac application on 12/31 (no cx taken). He was last admitted 2/11-2/12 for post op seroma and underwent I and D with primary wound closure and prevena wound vac on 2/11 ( no cx taken).  Per chart he has been on cefadroxil  since early January although wife thinks he may have been taking cefadroxil  even before that.    3/5 S/p debridement with primary wound closure and placement of Prevena wound VAC.  No cx taken.  Patient was discharged on 3/6 with plan  for 6 weeks of daptomycin , ceftriaxone  and metronidazole , EOT 4/16 for presumptive culture negative rt knee PJI.    Seen in the ED 3/17 with concerns for PICC line infection/generalized weakness, diarrhea, dark colored urine, soft BP. He was given IVF. PICC with some erythema but no other signs of infection like warmth or tenderness. He was discharged after IVF. Blood cx 2/2 sets negative. Antibiotics have been on hold since then   3/20 Accompanied by wife. Saw Dr Ernie Monday, sutures were  removed since rt knee wound had healed. He reports Dr Ernie said him another surgery in his knee wound be morbid and would not be recommended and prefers him to be on abtx. He expressed reluctance to continue IV abtx given his symptoms leading to ED visit. He never felt so bad as he did. All of his symptoms have resolved after stopping abtx. RT knee wound has healed with no symptoms concerning for infection.    01/16/24 Accompanied by wife. Taking PO augmentin  and doxycycline  as instructed. Denies missing doses. No concerns with antibiotics or rt knee. Rt knee has healed. Reports Rt knee looks normal like his left knee in the morning but swells up when he moves around in the day time. He is happy with the healing of rt knee wound. He says Dr Ernie says another surgery in his rt knee is morbid and prefers him to be possibly on life long antibiotics which patient and wife is OK with if needed.    03/07/24 Reports compliance with PO doxycycline  and augmentin   however, experiences increased bowel movements, increasing from his usual four times a day to eight to ten times a day, and thinks augmentin  is the culprit.  The stools are soft, denies abdominal pain, fever, chills, nausea, or vomiting. He takes Florastor, a probiotic, 250 mg twice daily, which he feels helps with loose stools. Last seen by Dr Ernie a month ago and was told swelling in his rt leg may take at least a year to resolve due to need  to mess with his lymph nodes during surgery which possibly has affected drainage.   04/11/24: reprots knee is bit more painful. Swelling unchanged. He went bowling then had worsening pain   Review of Systems: ROS  Past Medical History:  Diagnosis Date   Arthritis    Bronchitis    Depression    GERD (gastroesophageal reflux disease)    Hypertension    Skin cancer     Social History   Tobacco Use   Smoking status: Never   Smokeless tobacco: Never  Vaping Use   Vaping status: Never Used  Substance Use  Topics   Alcohol use: Yes    Comment: socially   Drug use: No    Family History  Problem Relation Age of Onset   Emphysema Mother        heart failure indusce emphysema   Heart disease Father    Heart attack Father    Heart disease Sister 49       cabgx3    Allergies  Allergen Reactions   Avapro  [Irbesartan ] Cough   Chlorhexidine  Rash    Reaction to PICC line dressing with Chlorhexidine  gel Not allergic to Chlorhexidine  soap   Lexapro [Escitalopram Oxalate] Anxiety    insomnia    Health Maintenance  Topic Date Due   Zoster Vaccines- Shingrix (1 of 2) 04/[birth year]/1971   Colonoscopy  Never done   COVID-19 Vaccine (6 - 2024-25 season) 06/04/2023   INFLUENZA VACCINE  05/03/2024   Medicare Annual Wellness (AWV)  06/28/2024   DTaP/Tdap/Td (5 - Td or Tdap) 04/02/2029   Pneumococcal Vaccine: 50+ Years  Completed   Hepatitis C Screening  Completed   Hepatitis B Vaccines  Aged Out   HPV VACCINES  Aged Out   Meningococcal B Vaccine  Aged Out    Objective:  Vitals:   04/11/24 1023  BP: 122/73  Pulse: 75  Temp: 98.1 F (36.7 C)  TempSrc: Temporal  SpO2: 98%  Weight: 230 lb (104.3 kg)  Height: 5' 9 (1.753 m)   Body mass index is 33.97 kg/m.  Physical Exam Constitutional:      General: He is not in acute distress.    Appearance: He is normal weight. He is not toxic-appearing.  HENT:     Head: Normocephalic and atraumatic.     Right Ear: External ear normal.     Left Ear: External ear normal.     Nose: No congestion or rhinorrhea.     Mouth/Throat:     Mouth: Mucous membranes are moist.     Pharynx: Oropharynx is clear.  Eyes:     Extraocular Movements: Extraocular movements intact.     Conjunctiva/sclera: Conjunctivae normal.     Pupils: Pupils are equal, round, and reactive to light.  Cardiovascular:     Rate and Rhythm: Normal rate and regular rhythm.     Heart sounds: No murmur heard.    No friction rub. No gallop.  Pulmonary:     Effort: Pulmonary effort  is normal.     Breath sounds: Normal breath sounds.  Abdominal:     General: Abdomen is flat. Bowel sounds are normal.     Palpations: Abdomen is soft.  Musculoskeletal:        General: No swelling.     Cervical back: Normal range of motion and neck supple.  Skin:    General: Skin is warm and dry.  Neurological:     General: No focal deficit present.     Mental Status: He is oriented to person, place, and time.  Psychiatric:        Mood and Affect: Mood normal.    Physical Exam   Lab Results Lab Results  Component Value Date   WBC 6.1 03/07/2024   HGB 11.6 (L) 03/07/2024   HCT 38.2 (L) 03/07/2024   MCV 84.3 03/07/2024   PLT 317 03/07/2024    Lab Results  Component Value Date   CREATININE 0.71 03/07/2024   BUN 21 03/07/2024   NA 137 03/07/2024   K 4.4 03/07/2024   CL 102 03/07/2024   CO2 26 03/07/2024    Lab Results  Component Value Date   ALT 21 03/07/2024   AST 19 03/07/2024   ALKPHOS 54 12/18/2023   BILITOT 0.3 03/07/2024    Lab Results  Component Value Date   CHOL 184 08/15/2023   HDL 72.30 08/15/2023   LDLCALC 100 (H) 08/15/2023   TRIG 54.0 08/15/2023   CHOLHDL 3 08/15/2023   No results found for: LABRPR, RPRTITER No results found for: HIV1RNAQUANT, HIV1RNAVL, CD4TABS   Problem List Items Addressed This Visit   None  Results   Assessment/Plan # Recurrent rt knee PJI  # Non healing surgical wound of rt knee  - prior h/o MSSA followed by finegoldia magna in 2024 s/p surgical intervention and antibiotics as above complicated by recurrent postop seroma as well as nonhealing surgical wound requiring multiple aspirations as well as debridements as abive without  any cultures taken. - Discharged on 3/6 with plan for 6 weeks of daptomycin , ceftriaxone  and metronidazole , EOT 4/16 for presumptive culture negative rt knee PJI.  - 3/17 IV abtx held in the setting of severe diarrhea and switched to PO doxycycline  and augmentin  since 3/20 clinic  visit - 3/20 clinically rt knee wound has healed   -6/5 labs crp 18.9 Plan  - continue PO doxycycline  and augmentin  -swelling unchaged, pain is worse.  If labs abnormal then will image for abscess.  - Fu with Orthopedics  -F/U wit ID in one month   # Medication Monitoring  - labs today     Loney Stank, MD [facility] 04/11/2024, 10:38 AM   I have personally spent 35 minutes involved in face-to-face and non-face-to-face activities for this patient on the day of the visit. Professional time spent includes the following activities: Preparing to see the patient (review of tests), Obtaining and/or reviewing separately obtained history (admission/discharge record), Performing a medically appropriate examination and/or evaluation , Ordering medications/tests/procedures, referring and communicating with other health care professionals, Documenting clinical information in the EMR, Independently interpreting results (not separately reported), Communicating results to the patient/family/caregiver, Counseling and educating the patient/family/caregiver and Care coordination (not separately reported).

## 2024-04-12 LAB — CBC WITH DIFFERENTIAL/PLATELET
Absolute Lymphocytes: 586 {cells}/uL — ABNORMAL LOW (ref 850–3900)
Absolute Monocytes: 412 {cells}/uL (ref 200–950)
Basophils Absolute: 29 {cells}/uL (ref 0–200)
Basophils Relative: 0.5 %
Eosinophils Absolute: 162 {cells}/uL (ref 15–500)
Eosinophils Relative: 2.8 %
HCT: 35.3 % — ABNORMAL LOW (ref 38.5–50.0)
Hemoglobin: 11.2 g/dL — ABNORMAL LOW (ref 13.2–17.1)
MCH: 26.4 pg — ABNORMAL LOW (ref 27.0–33.0)
MCHC: 31.7 g/dL — ABNORMAL LOW (ref 32.0–36.0)
MCV: 83.3 fL (ref 80.0–100.0)
MPV: 10.6 fL (ref 7.5–12.5)
Monocytes Relative: 7.1 %
Neutro Abs: 4611 {cells}/uL (ref 1500–7800)
Neutrophils Relative %: 79.5 %
Platelets: 236 Thousand/uL (ref 140–400)
RBC: 4.24 Million/uL (ref 4.20–5.80)
RDW: 16.8 % — ABNORMAL HIGH (ref 11.0–15.0)
Total Lymphocyte: 10.1 %
WBC: 5.8 Thousand/uL (ref 3.8–10.8)

## 2024-04-12 LAB — COMPLETE METABOLIC PANEL WITHOUT GFR
AG Ratio: 1.8 (calc) (ref 1.0–2.5)
ALT: 17 U/L (ref 9–46)
AST: 16 U/L (ref 10–35)
Albumin: 4 g/dL (ref 3.6–5.1)
Alkaline phosphatase (APISO): 70 U/L (ref 35–144)
BUN/Creatinine Ratio: 32 (calc) — ABNORMAL HIGH (ref 6–22)
BUN: 22 mg/dL (ref 7–25)
CO2: 24 mmol/L (ref 20–32)
Calcium: 9 mg/dL (ref 8.6–10.3)
Chloride: 103 mmol/L (ref 98–110)
Creat: 0.68 mg/dL — ABNORMAL LOW (ref 0.70–1.28)
Globulin: 2.2 g/dL (ref 1.9–3.7)
Glucose, Bld: 113 mg/dL — ABNORMAL HIGH (ref 65–99)
Potassium: 4.4 mmol/L (ref 3.5–5.3)
Sodium: 138 mmol/L (ref 135–146)
Total Bilirubin: 0.5 mg/dL (ref 0.2–1.2)
Total Protein: 6.2 g/dL (ref 6.1–8.1)

## 2024-04-12 LAB — SEDIMENTATION RATE: Sed Rate: 33 mm/h — ABNORMAL HIGH (ref 0–20)

## 2024-04-12 LAB — C-REACTIVE PROTEIN: CRP: 172 mg/L — ABNORMAL HIGH (ref <8.0)

## 2024-04-17 DIAGNOSIS — Z471 Aftercare following joint replacement surgery: Secondary | ICD-10-CM | POA: Diagnosis not present

## 2024-04-17 DIAGNOSIS — Z96651 Presence of right artificial knee joint: Secondary | ICD-10-CM | POA: Diagnosis not present

## 2024-05-07 ENCOUNTER — Ambulatory Visit: Payer: Self-pay | Admitting: Internal Medicine

## 2024-05-20 ENCOUNTER — Ambulatory Visit: Admitting: Internal Medicine

## 2024-05-20 NOTE — Progress Notes (Deleted)
 Patient: [name] [name]  DOB: 09-05-1951 MRN: [id number] PCP: Rollene Almarie DELENA, MD   Patient Active Problem List   Diagnosis Date Noted   Medication management 12/21/2023   PICC (peripherally inserted central catheter) in place 12/21/2023   Antibiotic-associated diarrhea 12/21/2023   Postoperative seroma of musculoskeletal structure after musculoskeletal procedure 10/03/2023   Postoperative seroma of subcutaneous tissue after non-dermatologic procedure 10/03/2023   Blood loss anemia 08/16/2023   S/P revision of total knee, right 06/01/2023   Infection of prosthetic right knee joint (HCC) 02/21/2023   Infection of right knee (HCC) 11/14/2022   Chronic sinusitis 09/02/2022   Encounter for general adult medical examination with abnormal findings 07/09/2021   BPH (benign prostatic hyperplasia) 07/09/2021   MDD (major depressive disorder), recurrent, in full remission (HCC) 07/09/2021   Essential hypertension 10/20/2008     Subjective:  [name] [name] is a 73 y.o. -year-old male with PMH as below including HTN, bronchitis, GERD, skin cancer, arthritis, depression, right knee PJI with MSSA status post I&D on 11/15/2022 treated with 8 weeks IV cefazolin  followed by suppressive PO cefadroxil  complicated by recurrent PJI requiring resection of right knee arthroplasty and spacer placement 02/21/23 ( OR Cx with finegoldia magna). He was initially started on Dapto and then transition to cefazolin , added metronidazole  as cultures grew Finegoldia magna, EOT 04/04/23. He had knee reimplantation in 06/01/23 after which he did well for a couple of months but started having recurrent prepatellar swellings and seromas since October 2024, s/p multiple fluid aspirations by Dr Lyndle office with no cultures take.   He was admitted 12/31-1/2 for post op seroma and underwent I and D with wound vac application on 12/31 (no cx taken). He was last admitted 2/11-2/12 for post op seroma and underwent I and D  with primary wound closure and prevena wound vac on 2/11 ( no cx taken).  Per chart he has been on cefadroxil  since early January although wife thinks he may have been taking cefadroxil  even before that.    3/5 S/p debridement with primary wound closure and placement of Prevena wound VAC.  No cx taken.  Patient was discharged on 3/6 with plan for 6 weeks of daptomycin , ceftriaxone  and metronidazole , EOT 4/16 for presumptive culture negative rt knee PJI.    Seen in the ED 3/17 with concerns for PICC line infection/generalized weakness, diarrhea, dark colored urine, soft BP. He was given IVF. PICC with some erythema but no other signs of infection like warmth or tenderness. He was discharged after IVF. Blood cx 2/2 sets negative. Antibiotics have been on hold since then   3/20 Accompanied by wife. Saw Dr Ernie Monday, sutures were removed since rt knee wound had healed. He reports Dr Ernie said him another surgery in his knee wound be morbid and would not be recommended and prefers him to be on abtx. He expressed reluctance to continue IV abtx given his symptoms leading to ED visit. He never felt so bad as he did. All of his symptoms have resolved after stopping abtx. RT knee wound has healed with no symptoms concerning for infection.    01/16/24 Accompanied by wife. Taking PO augmentin  and doxycycline  as instructed. Denies missing doses. No concerns with antibiotics or rt knee. Rt knee has healed. Reports Rt knee looks normal like his left knee in the morning but swells up when he moves around in the day time. He is happy with the healing of rt knee wound. He says Dr Ernie  says another surgery in his rt knee is morbid and prefers him to be possibly on life long antibiotics which patient and wife is OK with if needed.    03/07/24 Reports compliance with PO doxycycline  and augmentin   however, experiences increased bowel movements, increasing from his usual four times a day to eight to ten times a day, and thinks  augmentin  is the culprit.  The stools are soft, denies abdominal pain, fever, chills, nausea, or vomiting. He takes Florastor, a probiotic, 250 mg twice daily, which he feels helps with loose stools. Last seen by Dr Ernie a month ago and was told swelling in his rt leg may take at least a year to resolve due to need  to mess with his lymph nodes during surgery which possibly has affected drainage.   04/11/24: reprots knee is bit more painful. Swelling unchanged. He went bowling then had worsening pain  ROS  Past Medical History:  Diagnosis Date   Arthritis    Bronchitis    Depression    GERD (gastroesophageal reflux disease)    Hypertension    Skin cancer     Outpatient Medications Prior to Visit  Medication Sig Dispense Refill   amoxicillin -clavulanate (AUGMENTIN ) 500-125 MG tablet Take 1 tablet by mouth 2 (two) times daily. 60 tablet 5   calcium  carbonate (TUMS - DOSED IN MG ELEMENTAL CALCIUM ) 500 MG chewable tablet Chew 2 tablets by mouth 2 (two) times daily as needed for indigestion or heartburn.     doxycycline  (VIBRA -TABS) 100 MG tablet Take 1 tablet (100 mg total) by mouth 2 (two) times daily. 60 tablet 5   Ketotifen Fumarate (ALLERGY EYE DROPS OP) Place 1 drop into both eyes daily as needed (red/itchy eyes).     naproxen  (NAPROSYN ) 500 MG tablet Take 500 mg by mouth daily.     saccharomyces boulardii (FLORASTOR) 250 MG capsule Take 250 mg by mouth daily.     sertraline  (ZOLOFT ) 100 MG tablet Take 1 tablet (100 mg total) by mouth daily. 90 tablet 3   sodium chloride  (OCEAN) 0.65 % SOLN nasal spray Place 1 spray into both nostrils daily as needed for congestion.     tamsulosin  (FLOMAX ) 0.4 MG CAPS capsule Take 0.4 mg by mouth at bedtime.  0   telmisartan  (MICARDIS ) 80 MG tablet Take 1 tablet (80 mg total) by mouth daily. 90 tablet 3   No facility-administered medications prior to visit.     Allergies  Allergen Reactions   Avapro  [Irbesartan ] Cough   Chlorhexidine  Rash     Reaction to PICC line dressing with Chlorhexidine  gel Not allergic to Chlorhexidine  soap   Lexapro [Escitalopram Oxalate] Anxiety    insomnia    Social History   Tobacco Use   Smoking status: Never   Smokeless tobacco: Never  Vaping Use   Vaping status: Never Used  Substance Use Topics   Alcohol use: Yes    Comment: socially   Drug use: No    Family History  Problem Relation Age of Onset   Emphysema Mother        heart failure indusce emphysema   Heart disease Father    Heart attack Father    Heart disease Sister 17       cabgx3    Objective:  There were no vitals filed for this visit. There is no height or weight on file to calculate BMI.  Physical Exam  Lab Results: Lab Results  Component Value Date   WBC 5.8 04/11/2024  HGB 11.2 (L) 04/11/2024   HCT 35.3 (L) 04/11/2024   MCV 83.3 04/11/2024   PLT 236 04/11/2024    Lab Results  Component Value Date   CREATININE 0.68 (L) 04/11/2024   BUN 22 04/11/2024   NA 138 04/11/2024   K 4.4 04/11/2024   CL 103 04/11/2024   CO2 24 04/11/2024    Lab Results  Component Value Date   ALT 17 04/11/2024   AST 16 04/11/2024   ALKPHOS 54 12/18/2023   BILITOT 0.5 04/11/2024     Assessment & Plan:  Assessment/Plan # Recurrent rt knee PJI  # Non healing surgical wound of rt knee  - prior h/o MSSA followed by finegoldia magna in 2024 s/p surgical intervention and antibiotics as above complicated by recurrent postop seroma as well as nonhealing surgical wound requiring multiple aspirations as well as debridements as abive without any cultures taken. - Discharged on 3/6 with plan for 6 weeks of daptomycin , ceftriaxone  and metronidazole , EOT 4/16 for presumptive culture negative rt knee PJI.  - 3/17 IV abtx held in the setting of severe diarrhea and switched to PO doxycycline  and augmentin  since 3/20 clinic visit - 3/20 clinically rt knee wound has healed   -6/5 labs crp 18.9 =Esr and crp elevated at last visit. Seen by  ortho on 7/16, noted no obvious signs of infection.   lan  - continue PO doxycycline  and augmentin  -   # Medication Monitoring  - labs today    Loney Stank, MD [facility]   05/20/24  6:38 AM

## 2024-05-31 DIAGNOSIS — M25561 Pain in right knee: Secondary | ICD-10-CM | POA: Diagnosis not present

## 2024-05-31 DIAGNOSIS — R600 Localized edema: Secondary | ICD-10-CM | POA: Diagnosis not present

## 2024-05-31 DIAGNOSIS — Z96651 Presence of right artificial knee joint: Secondary | ICD-10-CM | POA: Diagnosis not present

## 2024-07-08 ENCOUNTER — Encounter (HOSPITAL_COMMUNITY): Payer: Self-pay

## 2024-07-08 ENCOUNTER — Emergency Department (HOSPITAL_COMMUNITY)
Admission: EM | Admit: 2024-07-08 | Discharge: 2024-07-09 | Discharge status: Against Medical Advice | Attending: Emergency Medicine | Admitting: Emergency Medicine

## 2024-07-08 DIAGNOSIS — Z5321 Procedure and treatment not carried out due to patient leaving prior to being seen by health care provider: Secondary | ICD-10-CM | POA: Insufficient documentation

## 2024-07-08 DIAGNOSIS — I83891 Varicose veins of right lower extremities with other complications: Secondary | ICD-10-CM | POA: Insufficient documentation

## 2024-07-08 NOTE — ED Triage Notes (Signed)
 Pt states that a varicose vein started bleeding on his r lower leg, bleeding controlled with pressure dressing, not on thinners.

## 2024-07-09 NOTE — ED Notes (Signed)
 Pt informed registration they were leaving

## 2024-07-11 DIAGNOSIS — I83893 Varicose veins of bilateral lower extremities with other complications: Secondary | ICD-10-CM | POA: Diagnosis not present

## 2024-07-11 DIAGNOSIS — I890 Lymphedema, not elsewhere classified: Secondary | ICD-10-CM | POA: Diagnosis not present

## 2024-07-15 ENCOUNTER — Encounter: Payer: Self-pay | Admitting: Family Medicine

## 2024-07-16 DIAGNOSIS — I872 Venous insufficiency (chronic) (peripheral): Secondary | ICD-10-CM | POA: Diagnosis not present

## 2024-07-18 DIAGNOSIS — Z09 Encounter for follow-up examination after completed treatment for conditions other than malignant neoplasm: Secondary | ICD-10-CM | POA: Diagnosis not present

## 2024-07-18 DIAGNOSIS — I83891 Varicose veins of right lower extremities with other complications: Secondary | ICD-10-CM | POA: Diagnosis not present

## 2024-07-22 DIAGNOSIS — I83892 Varicose veins of left lower extremities with other complications: Secondary | ICD-10-CM | POA: Diagnosis not present

## 2024-07-23 ENCOUNTER — Ambulatory Visit (HOSPITAL_COMMUNITY): Payer: Self-pay

## 2024-07-24 ENCOUNTER — Ambulatory Visit (HOSPITAL_COMMUNITY): Payer: Self-pay

## 2024-07-24 ENCOUNTER — Ambulatory Visit (HOSPITAL_COMMUNITY)
Admission: RE | Admit: 2024-07-24 | Discharge: 2024-07-24 | Disposition: A | Discharge status: Home / Self Care | Source: Ambulatory Visit

## 2024-07-24 ENCOUNTER — Encounter (HOSPITAL_COMMUNITY): Payer: Self-pay

## 2024-07-24 VITALS — BP 169/84 | HR 61 | Temp 97.4°F | Resp 16

## 2024-07-24 DIAGNOSIS — L03011 Cellulitis of right finger: Secondary | ICD-10-CM

## 2024-07-24 MED ORDER — SULFAMETHOXAZOLE-TRIMETHOPRIM 800-160 MG PO TABS: 1.0000 | ORAL_TABLET | Freq: Two times a day (BID) | ORAL | 0 refills | Status: AC

## 2024-07-24 NOTE — ED Triage Notes (Signed)
 Pt states swelling and redness to right middle finger on and off for the past 4 months.

## 2024-07-24 NOTE — ED Provider Notes (Signed)
 [facility] [place]  Note:  This document was prepared using Conservation officer, historic buildings and may include unintentional dictation errors.  MRN: [id number] DOB: [birth year]  Subjective:   [name] [name] is a 73 y.o. male presenting for evaluation of swelling, redness, pain to the right third finger x 4 months on and off.  Patient patient reports that he was grinding metal when he remembers pain in the cuticle and thought that maybe metal fragment had penetrated into the skin.  Patient reports at that time he has had repeated inflammation and pain to that same finger at the cuticle.  Patient denies any reinjury.  Patient does take daily doxycycline  100 mg twice daily for staph infection prevention due to complications with knee surgery.  No current facility-administered medications for this encounter.  Current Outpatient Medications:    sulfamethoxazole-trimethoprim (BACTRIM DS) 800-160 MG tablet, Take 1 tablet by mouth 2 (two) times daily for 7 days., Disp: 14 tablet, Rfl: 0   calcium  carbonate (TUMS - DOSED IN MG ELEMENTAL CALCIUM ) 500 MG chewable tablet, Chew 2 tablets by mouth 2 (two) times daily as needed for indigestion or heartburn., Disp: , Rfl:    doxycycline  (VIBRA -TABS) 100 MG tablet, Take 1 tablet (100 mg total) by mouth 2 (two) times daily., Disp: 60 tablet, Rfl: 5   Ketotifen Fumarate (ALLERGY EYE DROPS OP), Place 1 drop into both eyes daily as needed (red/itchy eyes)., Disp: , Rfl:    naproxen  (NAPROSYN ) 500 MG tablet, Take 500 mg by mouth daily., Disp: , Rfl:    saccharomyces boulardii (FLORASTOR) 250 MG capsule, Take 250 mg by mouth daily., Disp: , Rfl:    sertraline  (ZOLOFT ) 100 MG tablet, Take 1 tablet (100 mg total) by mouth daily., Disp: 90 tablet, Rfl: 3   sodium chloride  (OCEAN) 0.65 % SOLN nasal spray, Place 1 spray into both nostrils daily as needed for congestion., Disp: , Rfl:    tamsulosin  (FLOMAX ) 0.4 MG CAPS capsule, Take 0.4 mg by mouth at bedtime.,  Disp: , Rfl: 0   telmisartan  (MICARDIS ) 80 MG tablet, Take 1 tablet (80 mg total) by mouth daily., Disp: 90 tablet, Rfl: 3   Allergies  Allergen Reactions   Avapro  [Irbesartan ] Cough   Chlorhexidine  Rash    Reaction to PICC line dressing with Chlorhexidine  gel Not allergic to Chlorhexidine  soap   Lexapro [Escitalopram Oxalate] Anxiety    insomnia    Past Medical History:  Diagnosis Date   Arthritis    Bronchitis    Depression    GERD (gastroesophageal reflux disease)    Hypertension    Skin cancer      Past Surgical History:  Procedure Laterality Date   APPLICATION OF WOUND VAC Right 10/03/2023   Procedure: APPLICATION OF WOUND VAC;  Surgeon: Ernie Cough, MD;  Location: [facility];  Service: Orthopedics;  Laterality: Right;   CHOLECYSTECTOMY N/A 02/10/2021   Procedure: LAPAROSCOPIC CHOLECYSTECTOMY;  Surgeon: Vanderbilt Ned, MD;  Location: [facility];  Service: General;  Laterality: N/A;   EXCISIONAL TOTAL KNEE ARTHROPLASTY WITH ANTIBIOTIC SPACERS Right 02/21/2023   Procedure: EXCISIONAL TOTAL KNEE ARTHROPLASTY WITH ANTIBIOTIC SPACERS;  Surgeon: Ernie Cough, MD;  Location: [facility];  Service: Orthopedics;  Laterality: Right;   HERNIA REPAIR     I & D KNEE WITH POLY EXCHANGE Right 11/15/2022   Procedure: IRRIGATION AND DEBRIDEMENT KNEE WITH  POLY EXCHANGE;  Surgeon: Gerome Charleston, MD;  Location: [facility];  Service: Orthopedics;  Laterality: Right;  adductor canal, no antibiotics prior to culture  add on room to follow other case 60   IRRIGATION AND DEBRIDEMENT KNEE Right 10/03/2023   Procedure: IRRIGATION AND DEBRIDEMENT KNEE;  Surgeon: Ernie Cough, MD;  Location: [facility];  Service: Orthopedics;  Laterality: Right;  1 HR   IRRIGATION AND DEBRIDEMENT KNEE Right 11/14/2023   Procedure: RIGHT KNEE IRRIGATION AND DEBRIDEMENT AND WOUND CLOSURE;  Surgeon: Ernie Cough, MD;  Location: [facility];  Service: Orthopedics;  Laterality: Right;   IRRIGATION AND DEBRIDEMENT KNEE Right 12/05/2023    Procedure: IRRIGATION AND DEBRIDEMENT KNEE;  Surgeon: Ernie Cough, MD;  Location: [facility];  Service: Orthopedics;  Laterality: Right;  1 HR   Knee Surgery Right 10/25/2022   REIMPLANTATION OF TOTAL KNEE Right 06/01/2023   Procedure: REIMPLANTATION/REVISION OF TOTAL KNEE;  Surgeon: Ernie Cough, MD;  Location: [facility];  Service: Orthopedics;  Laterality: Right;   ROTATOR CUFF REPAIR     SKIN CANCER EXCISION      Family History  Problem Relation Age of Onset   Emphysema Mother        heart failure indusce emphysema   Heart disease Father    Heart attack Father    Heart disease Sister 75       cabgx3    Social History   Tobacco Use   Smoking status: Never   Smokeless tobacco: Never  Vaping Use   Vaping status: Never Used  Substance Use Topics   Alcohol use: Yes    Comment: socially   Drug use: No    ROS Refer to HPI for ROS details.  Objective:    Vitals: BP (!) 169/84 (BP Location: Left Arm)   Pulse 61   Temp (!) 97.4 F (36.3 C) (Oral)   Resp 16   SpO2 97%   Physical Exam Vitals and nursing note reviewed.  Constitutional:      General: He is not in acute distress.    Appearance: Normal appearance. He is not ill-appearing or toxic-appearing.  HENT:     Head: Normocephalic.  Cardiovascular:     Rate and Rhythm: Normal rate.  Pulmonary:     Effort: Pulmonary effort is normal. No respiratory distress.  Musculoskeletal:     Left hand: Swelling and tenderness present. No bony tenderness. Normal range of motion. Normal strength. Normal sensation. Normal capillary refill. Normal pulse.       Hands:  Skin:    General: Skin is warm and dry.     Capillary Refill: Capillary refill takes less than 2 seconds.  Neurological:     General: No focal deficit present.     Mental Status: He is alert and oriented to person, place, and time.  Psychiatric:        Mood and Affect: Mood normal.        Behavior: Behavior normal.     Procedures  No results found for this  or any previous visit (from the past 24 hours).  Assessment and Plan :     Discharge Instructions       1. Paronychia of finger of right hand (Primary) - sulfamethoxazole-trimethoprim (BACTRIM DS) 800-160 MG tablet; Take 1 tablet by mouth 2 (two) times daily for 7 days.  Dispense: 14 tablet; Refill: 0 - Continue taking previously prescribed doxycycline  for staph infection prevention. - Take naproxen  every 12 hours as needed for pain and inflammation secondary to paronychia - Soak finger in warm water  2-3 times a day for 10 to 15 minutes at a time, after removing finger from warm water  dry  and apply antibiotic ointment to the cuticle to help heal and decrease risk for secondary infection. -Continue to monitor symptoms for any change in severity if there is any escalation of current symptoms or development of new symptoms follow-up in ER for further evaluation and management.      Judyth Demarais B Geovani Tootle   Alannis Hsia, [place], [place] 07/24/24 1319

## 2024-07-24 NOTE — Discharge Instructions (Addendum)
  1. Paronychia of finger of right hand (Primary) - sulfamethoxazole-trimethoprim (BACTRIM DS) 800-160 MG tablet; Take 1 tablet by mouth 2 (two) times daily for 7 days.  Dispense: 14 tablet; Refill: 0 - Continue taking previously prescribed doxycycline  for staph infection prevention. - Take naproxen  every 12 hours as needed for pain and inflammation secondary to paronychia - Soak finger in warm water  2-3 times a day for 10 to 15 minutes at a time, after removing finger from warm water  dry and apply antibiotic ointment to the cuticle to help heal and decrease risk for secondary infection. -Continue to monitor symptoms for any change in severity if there is any escalation of current symptoms or development of new symptoms follow-up in ER for further evaluation and management.

## 2024-07-25 DIAGNOSIS — I83811 Varicose veins of right lower extremities with pain: Secondary | ICD-10-CM | POA: Diagnosis not present

## 2024-07-25 DIAGNOSIS — I83891 Varicose veins of right lower extremities with other complications: Secondary | ICD-10-CM | POA: Diagnosis not present

## 2024-07-29 DIAGNOSIS — I87391 Chronic venous hypertension (idiopathic) with other complications of right lower extremity: Secondary | ICD-10-CM | POA: Diagnosis not present

## 2024-07-29 DIAGNOSIS — I83892 Varicose veins of left lower extremities with other complications: Secondary | ICD-10-CM | POA: Diagnosis not present

## 2024-08-19 ENCOUNTER — Ambulatory Visit

## 2024-08-20 ENCOUNTER — Encounter: Payer: Medicare Other | Admitting: Internal Medicine

## 2024-08-27 ENCOUNTER — Ambulatory Visit: Admitting: Internal Medicine

## 2024-08-28 NOTE — Progress Notes (Signed)
 ERror

## 2024-09-03 ENCOUNTER — Ambulatory Visit: Admitting: Internal Medicine

## 2024-09-03 ENCOUNTER — Encounter: Payer: Self-pay | Admitting: Internal Medicine

## 2024-09-03 ENCOUNTER — Ambulatory Visit

## 2024-09-03 VITALS — BP 120/80 | HR 67 | Temp 98.7°F | Ht 69.0 in | Wt 230.0 lb

## 2024-09-03 DIAGNOSIS — M009 Pyogenic arthritis, unspecified: Secondary | ICD-10-CM

## 2024-09-03 DIAGNOSIS — Z0001 Encounter for general adult medical examination with abnormal findings: Secondary | ICD-10-CM

## 2024-09-03 DIAGNOSIS — Z23 Encounter for immunization: Secondary | ICD-10-CM

## 2024-09-03 DIAGNOSIS — I10 Essential (primary) hypertension: Secondary | ICD-10-CM

## 2024-09-03 DIAGNOSIS — M79641 Pain in right hand: Secondary | ICD-10-CM

## 2024-09-03 DIAGNOSIS — F3342 Major depressive disorder, recurrent, in full remission: Secondary | ICD-10-CM

## 2024-09-03 DIAGNOSIS — Z0000 Encounter for general adult medical examination without abnormal findings: Secondary | ICD-10-CM

## 2024-09-03 LAB — CBC
HCT: 38.6 % — ABNORMAL LOW (ref 39.0–52.0)
Hemoglobin: 12.9 g/dL — ABNORMAL LOW (ref 13.0–17.0)
MCHC: 33.5 g/dL (ref 30.0–36.0)
MCV: 85.8 fl (ref 78.0–100.0)
Platelets: 284 K/uL (ref 150.0–400.0)
RBC: 4.5 Mil/uL (ref 4.22–5.81)
RDW: 15.2 % (ref 11.5–15.5)
WBC: 7.6 K/uL (ref 4.0–10.5)

## 2024-09-03 LAB — LIPID PANEL
Cholesterol: 131 mg/dL (ref 0–200)
HDL: 53.5 mg/dL (ref >39.00)
LDL Cholesterol: 68 mg/dL (ref 0–99)
NonHDL: 77.56
Total CHOL/HDL Ratio: 2
Triglycerides: 48 mg/dL (ref 0.0–149.0)
VLDL: 9.6 mg/dL (ref 0.0–40.0)

## 2024-09-03 LAB — COMPREHENSIVE METABOLIC PANEL WITH GFR
ALT: 20 U/L (ref 0–53)
AST: 19 U/L (ref 0–37)
Albumin: 4.2 g/dL (ref 3.5–5.2)
Alkaline Phosphatase: 79 U/L (ref 39–117)
BUN: 19 mg/dL (ref 6–23)
CO2: 30 meq/L (ref 19–32)
Calcium: 9.4 mg/dL (ref 8.4–10.5)
Chloride: 100 meq/L (ref 96–112)
Creatinine, Ser: 0.77 mg/dL (ref 0.40–1.50)
GFR: 89.53 mL/min (ref >60.00)
Glucose, Bld: 101 mg/dL — ABNORMAL HIGH (ref 70–99)
Potassium: 4.3 meq/L (ref 3.5–5.1)
Sodium: 136 meq/L (ref 135–145)
Total Bilirubin: 0.5 mg/dL (ref 0.2–1.2)
Total Protein: 7 g/dL (ref 6.0–8.3)

## 2024-09-03 LAB — SEDIMENTATION RATE: Sed Rate: 44 mm/h — ABNORMAL HIGH (ref 0–20)

## 2024-09-03 NOTE — Progress Notes (Signed)
   Subjective:   Patient ID: [name] [name] Medicine, male    DOB: 1951-01-24, 73 y.o.   MRN: [id number]  The patient is here for physical. Pertinent topics discussed: Discussed the use of AI scribe software for clinical note transcription with the patient, who gave verbal consent to proceed.  History of Present Illness [name] [name] is a 73 year old male who presents with persistent knee swelling and a potential foreign body in the finger.  He experiences persistent swelling in his knee following ten surgeries. Despite medication use, the swelling remains. He wears compression stockings and has undergone vein therapy, including a procedure to fix a stuck valve. The knee is not painful but stiff, especially when using a 30-pound pack blower for yard work, which requires balancing and causes pressure on the knee. He uses a cane for support.  In early September, a vein in his leg burst at the [place], leading to significant bleeding. He was treated in the emergency room with a stitch and subsequently underwent about forty treatments at a vein clinic over two months. The treatments involved injections.  He has an issue with his finger that began six months ago after a piece of metal got stuck under his cuticle while working with steel. The area is swollen and occasionally painful, with a sensation of something being stuck. Despite soaking it in Epsom salt, swelling and crusting persist. He suspects a metal fragment might still be present.   PMH, [facility], social history reviewed and updated  Review of Systems  Constitutional: Negative.   HENT: Negative.    Eyes: Negative.   Respiratory:  Negative for cough, chest tightness and shortness of breath.   Cardiovascular:  Negative for chest pain, palpitations and leg swelling.  Gastrointestinal:  Negative for abdominal distention, abdominal pain, constipation, diarrhea, nausea and vomiting.  Musculoskeletal:  Positive for arthralgias and joint  swelling.  Skin: Negative.   Neurological: Negative.   Psychiatric/Behavioral: Negative.      Objective:  Physical Exam Constitutional:      Appearance: He is well-developed.  HENT:     Head: Normocephalic and atraumatic.  Cardiovascular:     Rate and Rhythm: Normal rate and regular rhythm.  Pulmonary:     Effort: Pulmonary effort is normal. No respiratory distress.     Breath sounds: Normal breath sounds. No wheezing or rales.  Abdominal:     General: Bowel sounds are normal. There is no distension.     Palpations: Abdomen is soft.     Tenderness: There is no abdominal tenderness.  Musculoskeletal:     Cervical back: Normal range of motion.     Comments: Right knee swollen at baseline  Skin:    General: Skin is warm and dry.  Neurological:     Mental Status: He is alert and oriented to person, place, and time.     Coordination: Coordination normal.     Vitals:   09/03/24 1039  BP: 120/80  Pulse: 67  Temp: 98.7 F (37.1 C)  SpO2: 98%  Weight: 230 lb (104.3 kg)  Height: 5' 9 (1.753 m)    Assessment & Plan:  Flu shot given at visit

## 2024-09-04 ENCOUNTER — Ambulatory Visit: Payer: Self-pay | Admitting: Internal Medicine

## 2024-09-04 DIAGNOSIS — S60459A Superficial foreign body of unspecified finger, initial encounter: Secondary | ICD-10-CM

## 2024-09-06 DIAGNOSIS — M79641 Pain in right hand: Secondary | ICD-10-CM | POA: Insufficient documentation

## 2024-09-06 NOTE — Assessment & Plan Note (Signed)
 Concern for right hand retained metal fragments around the cuticle bed. X-ray ordered to assess.

## 2024-09-06 NOTE — Assessment & Plan Note (Signed)
 Taking zoloft  and well controlled and continue. Recurrent and in remission.

## 2024-09-06 NOTE — Assessment & Plan Note (Signed)
 BP at goal checking CMP and adjust as needed.

## 2024-09-06 NOTE — Assessment & Plan Note (Signed)
 Flu shot given. Pneumonia complete. Shingrix counseled. Tetanus up to date. Colonoscopy counseled. Counseled about sun safety and mole surveillance. Counseled about the dangers of distracted driving. Given 10 year screening recommendations.

## 2024-09-06 NOTE — Assessment & Plan Note (Signed)
 Checking ESR and adjust as needed. On doxycycline  chronically and tolerating well.

## 2024-09-20 ENCOUNTER — Encounter

## 2024-09-20 ENCOUNTER — Other Ambulatory Visit: Payer: Self-pay | Admitting: Internal Medicine

## 2024-09-20 NOTE — Progress Notes (Signed)
 This encounter was created in error - please disregard.

## 2024-09-24 NOTE — Progress Notes (Signed)
 This encounter was created in error - please disregard.

## 2024-10-06 ENCOUNTER — Other Ambulatory Visit: Payer: Self-pay | Admitting: Internal Medicine
